# Patient Record
Sex: Female | Born: 1960
Health system: Southern US, Community
[De-identification: ages and names within clinical notes are randomized; demographics above are authoritative.]

## PROBLEM LIST (undated history)

## (undated) DIAGNOSIS — F419 Anxiety disorder, unspecified: Secondary | ICD-10-CM

## (undated) DIAGNOSIS — L570 Actinic keratosis: Secondary | ICD-10-CM

## (undated) DIAGNOSIS — F32A Depression, unspecified: Secondary | ICD-10-CM

## (undated) DIAGNOSIS — F329 Major depressive disorder, single episode, unspecified: Secondary | ICD-10-CM

## (undated) HISTORY — DX: Depression, unspecified: F32.A

## (undated) HISTORY — DX: Anxiety disorder, unspecified: F41.9

## (undated) HISTORY — DX: Actinic keratosis: L57.0

## (undated) HISTORY — DX: Major depressive disorder, single episode, unspecified: F32.9

---
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
## 2006-06-13 HISTORY — PX: BREAST BIOPSY: SHX20

## 2017-12-27 ENCOUNTER — Ambulatory Visit: Payer: Commercial Managed Care - HMO | Attending: Physician Assistant | Admitting: Occupational Therapy

## 2017-12-27 ENCOUNTER — Other Ambulatory Visit: Payer: Self-pay

## 2017-12-27 ENCOUNTER — Encounter: Payer: Self-pay | Admitting: Occupational Therapy

## 2017-12-27 DIAGNOSIS — M6281 Muscle weakness (generalized): Secondary | ICD-10-CM

## 2017-12-27 DIAGNOSIS — M25532 Pain in left wrist: Secondary | ICD-10-CM | POA: Insufficient documentation

## 2017-12-27 DIAGNOSIS — M25632 Stiffness of left wrist, not elsewhere classified: Secondary | ICD-10-CM | POA: Diagnosis not present

## 2017-12-27 DIAGNOSIS — M79642 Pain in left hand: Secondary | ICD-10-CM | POA: Insufficient documentation

## 2017-12-27 NOTE — Patient Instructions (Signed)
Heat ,  PROM for wrist RD, UD, flexion and extention   10 reps   2 -3 x day   AROM for wrist in all planes   10 reps  2 -3 x day  Tendon glides  And opposition   keep pain under 1-2/10 and slight pull or stretch

## 2017-12-27 NOTE — Therapy (Signed)
[place] [facility] [address] [place], [address] Phone: [phone]   Fax:  [phone]  Occupational Therapy Evaluation  Patient Details  Name: Barbara Russo MRN: [id number] Date of Birth: [birth year] Referring Provider: Dorise Hiss   Encounter Date: 12/27/2017  OT End of Session - 12/27/17 1712    Visit Number  1    Number of Visits  12    Date for OT Re-Evaluation  02/07/18    OT Start Time  1440    OT Stop Time  1522    OT Time Calculation (min)  42 min    Activity Tolerance  Patient tolerated treatment well    Behavior During Therapy  [facility] for tasks assessed/performed       History reviewed. No pertinent past medical history.  History reviewed. No pertinent surgical history.  There were no vitals filed for this visit.  Subjective Assessment - 12/27/17 1705    Subjective   I just moved down here from [place] - I fell  about 8th of May - while I was moving  - fell over a  bag - was in brace for 6 wks and then since end of June out of splint but still having pain and stiffness in my R wrist -and I am R hand dominant     Patient Stated Goals  I want to use my hand without pain - being able to pick up things, write , use scissors, opening jars, cutting food, typing ,     Currently in Pain?  No/denies        [facility] OT Assessment - 12/27/17 0001      Assessment   Medical Diagnosis  R distal radius fx    Referring Provider  Dorise Hiss    Onset Date/Surgical Date  10/18/17    Hand Dominance  Right      Home  Environment   Lives With  Alone      Prior Function   Leisure  Doing real estate trng, have cat , likes to cook, on computer a lot, study      AROM   Right Wrist Extension  57 Degrees    Right Wrist Flexion  35 Degrees    Right Wrist Radial Deviation  13 Degrees    Right Wrist Ulnar Deviation  20 Degrees    Left Wrist Extension  75 Degrees    Left Wrist Flexion  90 Degrees    Left Wrist  Radial Deviation  24 Degrees    Left Wrist Ulnar Deviation  42 Degrees      Strength   Right Hand Grip (lbs)  24    Right Hand Lateral Pinch  10 lbs    Right Hand 3 Point Pinch  12 lbs    Left Hand Grip (lbs)  50    Left Hand Lateral Pinch  16 lbs    Left Hand 3 Point Pinch  18 lbs       Fluidotherapy done with AROM for R wrist in all planes   review HEP and hand out provided      Heat ,  PROM for wrist RD, UD, flexion and extention   10 reps   2 -3 x day   AROM for wrist in all planes   10 reps  2 -3 x day  Tendon glides  And opposition   keep pain under 1-2/10 and slight pull or stretch  OT Education - 12/27/17 1712    Education Details  findings of eval and HEP     Person(s) Educated  Patient    Methods  Explanation;Demonstration;Tactile cues;Verbal cues    Comprehension  Verbalized understanding;Returned demonstration;Verbal cues required       OT Short Term Goals - 12/27/17 1717      OT SHORT TERM GOAL #1   Title  Pain on PRWHE improve with more than 20 points     Baseline  pain on PRWHE is 21/50    Time  3    Period  Weeks    Status  New    Target Date  01/17/18      OT SHORT TERM GOAL #2   Title  R wrist AROM improve to [facility] to use wrist in more than 50% of ADL's and IADl's without increase pain or symptoms     Baseline  see flowsheet- decrease AROM wrist RD,UD, flexion and extention     Time  3    Period  Weeks    Status  New    Target Date  01/17/18        OT Long Term Goals - 12/27/17 1718      OT LONG TERM GOAL #1   Title  R wrist strength increase  to 4+/5 in ROM to be able to  turn doorknob, do housework  without symptoms     Baseline  See eval     Time  5    Period  Weeks    Status  New    Target Date  01/31/18      OT LONG TERM GOAL #2   Title  R grip strength increase  to more 50% to carry more than 5 lbs use scissors without increase pain     Baseline  Grip on R 24, L 50 lbs    Time  6    Period  Weeks     Status  New    Target Date  02/07/18      OT LONG TERM GOAL #3   Title  Function score on PRWHE improve wiht more than 20 points     Baseline  function score on PRWHE at eval 33/50    Time  6    Period  Weeks    Status  New    Target Date  02/07/18            Plan - 12/27/17 1713    Clinical Impression Statement  Pt present at OT eval about 10 wks out from R distal radius fx - pt was in cast 6 wks - and then moved down to [place] - pt show increase pain to 8/10 per pt at times , decrease AROM in wrist and some pain with making fist or gripping objects - limiting her functional use of R hand in ADL's and IADl's     Occupational performance deficits (Please refer to evaluation for details):  ADL's;IADL's;Play;Leisure;Education    Rehab Potential  Good    OT Frequency  2x / week    OT Duration  6 weeks    OT Treatment/Interventions  Self-care/ADL training;Therapeutic exercise;Patient/family education;Paraffin;Fluidtherapy;Passive range of motion;Manual Therapy;Contrast Bath    Plan  Assess progres with HEP and add strengthening     Clinical Decision Making  Several treatment options, min-mod task modification necessary    OT Home Exercise Plan  see pt instruction    Consulted and Agree with Plan of Care  Patient  Patient will benefit from skilled therapeutic intervention in order to improve the following deficits and impairments:  Impaired flexibility, Pain, Decreased strength, Impaired UE functional use, Decreased range of motion  Visit Diagnosis: Pain in left hand  Pain in left wrist  Stiffness of left wrist, not elsewhere classified  Muscle weakness (generalized)    Problem List There are no active problems to display for this patient.   Rosalyn Gess OTR/L,CLT 12/27/2017, 5:25 PM  [place] [facility] [address] [place], [address] Phone: [phone]   Fax:  [phone]  Name: Barbara Russo MRN: [id number] Date of Birth: 07-16-1960

## 2018-01-01 ENCOUNTER — Ambulatory Visit: Payer: Commercial Managed Care - HMO | Admitting: Occupational Therapy

## 2018-01-01 DIAGNOSIS — M79642 Pain in left hand: Secondary | ICD-10-CM | POA: Diagnosis not present

## 2018-01-01 DIAGNOSIS — M6281 Muscle weakness (generalized): Secondary | ICD-10-CM

## 2018-01-01 DIAGNOSIS — M25632 Stiffness of left wrist, not elsewhere classified: Secondary | ICD-10-CM

## 2018-01-01 DIAGNOSIS — M25532 Pain in left wrist: Secondary | ICD-10-CM

## 2018-01-01 NOTE — Therapy (Signed)
[place] [facility] [address], [place], [address] Phone: [phone]   Fax:  [phone]  Occupational Therapy Treatment  Patient Details  Name: Barbara Russo MRN: [id number] Date of Birth: Jun 15, 1960 Referring Provider: Dorise Hiss   Encounter Date: 01/01/2018  OT End of Session - 01/01/18 1443    Visit Number  2    Number of Visits  12    Date for OT Re-Evaluation  02/07/18    OT Start Time  1430    OT Stop Time  1512    OT Time Calculation (min)  42 min    Activity Tolerance  Patient tolerated treatment well    Behavior During Therapy  [facility] for tasks assessed/performed       No past medical history on file.  No past surgical history on file.  There were no vitals filed for this visit.  Subjective Assessment - 01/01/18 1441    Subjective   I am doing okay - feel like my wrist can move little better - not as tight - did had some soreness the next day after my exercises -     Patient Stated Goals  I want to use my hand without pain - being able to pick up things, write , use scissors, opening jars, cutting food, typing ,     Currently in Pain?  No/denies         [facility] OT Assessment - 01/01/18 0001      AROM   Right Wrist Extension  65 Degrees    Right Wrist Flexion  55 Degrees    Right Wrist Radial Deviation  20 Degrees    Right Wrist Ulnar Deviation  23 Degrees            assess AROM for L wrist in all planes -and compare in flowsheet    OT Treatments/Exercises (OP) - 01/01/18 0001      RUE Fluidotherapy   Number Minutes Fluidotherapy  8 Minutes    RUE Fluidotherapy Location  Hand;Wrist    Comments  AROM at [facility] to increase ROM      soft tissue mobs done to digits MC spreads  And Graston tool nr 2 for sweeping on dorsal and volar wrist  And forearm     PROM for wrist RD, UD, flexion and extention over edge of table  And review too - prayer stretch and wrist flexion over arm  rest    10 reps   2 x day    Same HEP  To cont with for PROM  But add and review    16 oz hammer for wrist in all planes - 10 reps  1 set   2 x day  Can increase to 2 sets in 3-4 days  Putty teal for gripping and lat , 3 point grip  12 reps   2 x day  Increase to 2 sets  3-4 days        OT Education - 01/01/18 1443    Education Details  upgrade HEP     Person(s) Educated  Patient    Methods  Explanation;Demonstration;Handout    Comprehension  Returned demonstration;Verbalized understanding       OT Short Term Goals - 12/27/17 1717      OT SHORT TERM GOAL #1   Title  Pain on PRWHE improve with more than 20 points     Baseline  pain on PRWHE is 21/50    Time  3    Period  Weeks    Status  New    Target Date  01/17/18      OT SHORT TERM GOAL #2   Title  R wrist AROM improve to [facility] to use wrist in more than 50% of ADL's and IADl's without increase pain or symptoms     Baseline  see flowsheet- decrease AROM wrist RD,UD, flexion and extention     Time  3    Period  Weeks    Status  New    Target Date  01/17/18        OT Long Term Goals - 12/27/17 1718      OT LONG TERM GOAL #1   Title  R wrist strength increase  to 4+/5 in ROM to be able to  turn doorknob, do housework  without symptoms     Baseline  See eval     Time  5    Period  Weeks    Status  New    Target Date  01/31/18      OT LONG TERM GOAL #2   Title  R grip strength increase  to more 50% to carry more than 5 lbs use scissors without increase pain     Baseline  Grip on R 24, L 50 lbs    Time  6    Period  Weeks    Status  New    Target Date  02/07/18      OT LONG TERM GOAL #3   Title  Function score on PRWHE improve wiht more than 20 points     Baseline  function score on PRWHE at eval 33/50    Time  6    Period  Weeks    Status  New    Target Date  02/07/18            Plan - 01/01/18 1443    Clinical Impression Statement  Pt made great progress in AROM in R wrist AROM since eval  last week - initated this date weight for wrist and putty for grip - pt to gradually increase sets over the next week -and will reassess next week - but keep pain under 2/10 pain     Occupational performance deficits (Please refer to evaluation for details):  ADL's;IADL's;Play;Leisure;Education    Rehab Potential  Good    OT Frequency  1x / week    OT Duration  6 weeks    OT Treatment/Interventions  Self-care/ADL training;Therapeutic exercise;Patient/family education;Paraffin;Fluidtherapy;Passive range of motion;Manual Therapy;Contrast Bath    Plan  upgrade HEP as needed     Clinical Decision Making  Several treatment options, min-mod task modification necessary    OT Home Exercise Plan  see pt instruction    Consulted and Agree with Plan of Care  Patient       Patient will benefit from skilled therapeutic intervention in order to improve the following deficits and impairments:  Impaired flexibility, Pain, Decreased strength, Impaired UE functional use, Decreased range of motion  Visit Diagnosis: Pain in left hand  Pain in left wrist  Stiffness of left wrist, not elsewhere classified  Muscle weakness (generalized)    Problem List There are no active problems to display for this patient.   Rosalyn Gess OTR/L,CLT 01/01/2018, 3:56 PM  [place] [facility] [address] [place], [address] Phone: [phone]   Fax:  [phone]  Name: Tomorrow Dehaas MRN: [id number] Date of  Birth: 11-06-1960

## 2018-01-01 NOTE — Patient Instructions (Signed)
Same HEP   add 16 oz hammer for wrist in all planes - 10 reps  1 set   2 x day  Can increase to 2 sets in 3-4 days  Putty teal for gripping and lat , 3 point grip  12 reps   2 x day  Increase to 2 sets  3-4 days

## 2018-01-03 ENCOUNTER — Ambulatory Visit: Payer: Commercial Managed Care - HMO | Admitting: Occupational Therapy

## 2018-01-08 ENCOUNTER — Ambulatory Visit: Payer: Commercial Managed Care - HMO | Admitting: Occupational Therapy

## 2018-01-10 ENCOUNTER — Ambulatory Visit: Payer: Commercial Managed Care - HMO | Admitting: Occupational Therapy

## 2018-01-10 DIAGNOSIS — M6281 Muscle weakness (generalized): Secondary | ICD-10-CM

## 2018-01-10 DIAGNOSIS — M25632 Stiffness of left wrist, not elsewhere classified: Secondary | ICD-10-CM

## 2018-01-10 DIAGNOSIS — M25532 Pain in left wrist: Secondary | ICD-10-CM

## 2018-01-10 DIAGNOSIS — M79642 Pain in left hand: Secondary | ICD-10-CM | POA: Diagnosis not present

## 2018-01-10 NOTE — Patient Instructions (Signed)
Cont with heat , PROM for wrist flexion and prayer stretch   2 lbs for wrist in all planes  10 reps  2 x day and increase to 3 sets again over the next week   Same putty - same HEP  But add pulling and twisting ( boht ways )  10 reps  2 x day  Increase sets over  Week

## 2018-01-10 NOTE — Therapy (Signed)
[place] [facility] [address] [place], [address] Phone: [phone]   Fax:  [phone]  Occupational Therapy Treatment  Patient Details  Name: Barbara Russo MRN: [id number] Date of Birth: 1960/12/28 Referring Provider: Dorise Hiss   Encounter Date: 01/10/2018  OT End of Session - 01/10/18 1337    Visit Number  3    Number of Visits  12    Date for OT Re-Evaluation  02/07/18    OT Start Time  1207    OT Stop Time  1240    OT Time Calculation (min)  33 min    Activity Tolerance  Patient tolerated treatment well    Behavior During Therapy  [facility] for tasks assessed/performed       No past medical history on file.  No past surgical history on file.  There were no vitals filed for this visit.  Subjective Assessment - 01/10/18 1206    Subjective   Doing more things with my hands - pain is better- little more soreness but not bad- need to go over the hammer  exercise -and  the putty feels so good - maybe over did it -     Patient Stated Goals  I want to use my hand without pain - being able to pick up things, write , use scissors, opening jars, cutting food, typing ,     Currently in Pain?  Yes    Pain Score  1     Pain Location  Wrist    Pain Orientation  Right    Pain Descriptors / Indicators  Sore    Pain Type  Acute pain    Pain Onset  More than a month ago    Pain Frequency  Intermittent         [facility] OT Assessment - 01/10/18 0001      AROM   Right Wrist Extension  75 Degrees    Right Wrist Flexion  73 Degrees    Right Wrist Radial Deviation  10 Degrees    Right Wrist Ulnar Deviation  28 Degrees      Strength   Right Hand Grip (lbs)  40    Right Hand Lateral Pinch  16 lbs    Right Hand 3 Point Pinch  16 lbs    Left Hand Grip (lbs)  50    Left Hand Lateral Pinch  16 lbs    Left Hand 3 Point Pinch  18 lbs       assess progress in AROM in wrist and grip /prehension - great progress -  see flowsheet  Pt report not able to hyper ext - 4th and 5th on R hand - appear pt has some dupuytrents -          OT Treatments/Exercises (OP) - 01/10/18 0001      RUE Fluidotherapy   Number Minutes Fluidotherapy  8 Minutes    RUE Fluidotherapy Location  Hand;Wrist    Comments  AROM for wrist flexoin      Soft tissue mobs using Graston tool nr 2 over volar and dorsal forearm and wrist prior to flexion stretch  PROM for wrist flexion - open hand can close fist  and prayer stretch   2 lbs for wrist in all planes  10 reps  - no pain  2 x day and increase to 3 sets again over the next week   Same putty - same HEP  But done and  add  pulling and twisting ( boht ways )  10 reps  2 x day  Increase sets over  Week        OT Education - 01/10/18 1337    Education Details  upgrade HEP to 2 lbs weight and more putty HEP     Person(s) Educated  Patient    Methods  Explanation;Demonstration;Handout    Comprehension  Verbalized understanding;Returned demonstration       OT Short Term Goals - 12/27/17 1717      OT SHORT TERM GOAL #1   Title  Pain on PRWHE improve with more than 20 points     Baseline  pain on PRWHE is 21/50    Time  3    Period  Weeks    Status  New    Target Date  01/17/18      OT SHORT TERM GOAL #2   Title  R wrist AROM improve to [facility] to use wrist in more than 50% of ADL's and IADl's without increase pain or symptoms     Baseline  see flowsheet- decrease AROM wrist RD,UD, flexion and extention     Time  3    Period  Weeks    Status  New    Target Date  01/17/18        OT Long Term Goals - 12/27/17 1718      OT LONG TERM GOAL #1   Title  R wrist strength increase  to 4+/5 in ROM to be able to  turn doorknob, do housework  without symptoms     Baseline  See eval     Time  5    Period  Weeks    Status  New    Target Date  01/31/18      OT LONG TERM GOAL #2   Title  R grip strength increase  to more 50% to carry more than 5 lbs use scissors  without increase pain     Baseline  Grip on R 24, L 50 lbs    Time  6    Period  Weeks    Status  New    Target Date  02/07/18      OT LONG TERM GOAL #3   Title  Function score on PRWHE improve wiht more than 20 points     Baseline  function score on PRWHE at eval 33/50    Time  6    Period  Weeks    Status  New    Target Date  02/07/18            Plan - 01/10/18 1338    Clinical Impression Statement  Pt made again great progress in AROM in wrist in all planes and grip /prehension increase  - only decrease still in flexion of wrist - with some soreness but not bad - increase functional use  - upgrade HEP again to 2 lbs for wrist     Occupational performance deficits (Please refer to evaluation for details):  ADL's;IADL's;Play;Leisure;Education    Rehab Potential  Good    OT Frequency  1x / week    OT Duration  4 weeks    OT Treatment/Interventions  Self-care/ADL training;Therapeutic exercise;Patient/family education;Paraffin;Fluidtherapy;Passive range of motion;Manual Therapy;Contrast Bath    Plan  upgrade HEP as needed     Clinical Decision Making  Several treatment options, min-mod task modification necessary    OT Home Exercise Plan  see pt instruction    Consulted and Agree with  Plan of Care  Patient       Patient will benefit from skilled therapeutic intervention in order to improve the following deficits and impairments:  Impaired flexibility, Pain, Decreased strength, Impaired UE functional use, Decreased range of motion  Visit Diagnosis: Pain in left hand  Pain in left wrist  Stiffness of left wrist, not elsewhere classified  Muscle weakness (generalized)    Problem List There are no active problems to display for this patient.   Rosalyn Gess OTR/l,CLT 01/10/2018, 1:39 PM  [place] [facility] [address], [place], [address] Phone: [phone]   Fax:  [phone]  Name: Barbara Russo MRN: [id number] Date of Birth: Jan 20, 1961

## 2018-01-19 ENCOUNTER — Ambulatory Visit: Payer: 59 | Admitting: Occupational Therapy

## 2018-01-25 ENCOUNTER — Ambulatory Visit: Payer: 59 | Admitting: Occupational Therapy

## 2018-01-30 ENCOUNTER — Ambulatory Visit (INDEPENDENT_AMBULATORY_CARE_PROVIDER_SITE_OTHER): Payer: 59 | Admitting: Psychiatry

## 2018-01-30 ENCOUNTER — Encounter: Payer: Self-pay | Admitting: Psychiatry

## 2018-01-30 VITALS — BP 130/80 | HR 120 | Temp 97.7°F | Ht 68.7 in | Wt 214.2 lb

## 2018-01-30 DIAGNOSIS — F331 Major depressive disorder, recurrent, moderate: Secondary | ICD-10-CM

## 2018-01-30 DIAGNOSIS — F5081 Binge eating disorder: Secondary | ICD-10-CM | POA: Diagnosis not present

## 2018-01-30 MED ORDER — CARIPRAZINE HCL 3 MG PO CAPS: 3.0000 mg | ORAL_CAPSULE | Freq: Every day | ORAL | 1 refills | Status: DC

## 2018-01-30 MED ORDER — BUSPIRONE HCL 15 MG PO TABS: 15.0000 mg | ORAL_TABLET | Freq: Two times a day (BID) | ORAL | 1 refills | Status: DC

## 2018-01-30 MED ORDER — LISDEXAMFETAMINE DIMESYLATE 50 MG PO CAPS: 50.0000 mg | ORAL_CAPSULE | Freq: Every day | ORAL | 0 refills | Status: DC

## 2018-01-30 MED ORDER — BUPROPION HCL ER (XL) 300 MG PO TB24: 300.0000 mg | ORAL_TABLET | Freq: Every day | ORAL | 1 refills | Status: DC

## 2018-01-30 NOTE — Progress Notes (Signed)
Psychiatric Initial Adult Assessment   Patient Identification: Barbara Russo MRN:  [id number] Date of Evaluation:  01/30/2018 Referral Source: Dr.Hala Moustafa Chief Complaint:  ' I am here to establish care," Chief Complaint    Establish Care; Anxiety; Depression     Visit Diagnosis:    ICD-10-CM   1. MDD (major depressive disorder), recurrent episode, moderate (HCC) F33.1 buPROPion (WELLBUTRIN XL) 300 MG 24 hr tablet    cariprazine (VRAYLAR) capsule    busPIRone (BUSPAR) 15 MG tablet    DISCONTINUED: busPIRone (BUSPAR) 15 MG tablet  2. Binge eating disorder F50.81 lisdexamfetamine (VYVANSE) 50 MG capsule    History of Present Illness:  Barbara Russo is a 57 yr old CF, single, unemployed , lives in [place], history of MDD, binge eating disorder, headaches, hyperlipidemia, presented to the clinic today to establish care.  Patient recently moved to [place] in May 2019 from [place].  Patient used to be under the care of [facility] in [place].  Patient reports she moved to [place] to be closer to her brother and start a job with his company.  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  She reports she has a history of depression and binge eating disorder.  She reports she has been struggling with depression on and off since the past 10 years or more.  She reports her depressive symptoms worsened 4-5 years ago since she went through several psychosocial stressors at this time.  She reports 5 years ago she had to leave her job to take care of her elderly parents who were dying.  She reports at the same time she broke up with her ex-boyfriend.  Patient reports she was started on additional medication during that time.  She reports the medications helped her a lot.  Patient describes her depressive symptoms as crying spells, sadness, excessive eating some days and not eating the other days, mood lability, low energy and so on.   She reports she takes Wellbutrin 300 mg, BuSpar 30 mg daily as well as Vraylar.  Reports this combination of medication is helping.  She reports she wants to stay on her medications as prescribed for now.  Patient also reports a history of binge eating disorder.  Patient reports there are days when she eats okay and other days when she eats a lot/excessive.  She reports she has gained a lot of weight also over the years due to her binging.  She reports she copes with anxiety stress by binging.  She denies any compensatory behaviors like using laxatives or vomiting and so on.  She reports she is on Vyvanse prescribed by her psychiatrist since the past 1 year or so.  Patient reports that is helpful.  She denies any side effects.  Patient denies any suicidality.  Patient denies any perceptual disturbances.  Patient denies any homicidality.  Patient denies any manic or hypomanic symptoms.  Patient denies any substance abuse problems.  Patient reports good social support system from her brothers.  One of her brothers came with her to [place] and currently lives close to her.  She reports she is in training to start a job at her brother's company.  Associated Signs/Symptoms: Depression Symptoms:  depressed mood, weight gain, increased appetite, (Hypo) Manic Symptoms:  denies Anxiety Symptoms:  denies Psychotic Symptoms:  denies PTSD Symptoms: Negative  Past Psychiatric History: Patient denies any inpatient mental health admissions.  Patient used to be under the care of Dr.Moustafa  at [facility] in [place].  Patient denies any suicide attempts  Previous Psychotropic Medications: Yes Trials of medications like Wellbutrin, Vyvanse, BuSpar, Valium, Depakote ( FOR HEADACHES)   Substance Abuse History in the last 12 months:  No.  Consequences of Substance Abuse: Negative  Past Medical History:  Past Medical History:  Diagnosis Date  . Anxiety   . Depression    History reviewed.  No pertinent surgical history.  Family Psychiatric History: Patient reports she was adopted and hence does not know if any of her family members had mental health problems.  She denies any mental health issues and her adopted parents/family  Family History:  Family History  Adopted: Yes    Social History:   Social History   Socioeconomic History  . Marital status: Single    Spouse name: Not on file  . Number of children: 0  . Years of education: Not on file  . Highest education level: Bachelor's degree (e.g., BA, AB, BS)  Occupational History  . Not on file  Social Needs  . Financial resource strain: Not hard at all  . Food insecurity:    Worry: Never true    Inability: Never true  . Transportation needs:    Medical: No    Non-medical: No  Tobacco Use  . Smoking status: Never Smoker  . Smokeless tobacco: Never Used  Substance and Sexual Activity  . Alcohol use: Yes    Alcohol/week: 2.0 standard drinks    Types: 2 Cans of beer per week  . Drug use: Never  . Sexual activity: Not Currently  Lifestyle  . Physical activity:    Days per week: 0 days    Minutes per session: 0 min  . Stress: Not at all  Relationships  . Social connections:    Talks on phone: More than three times a week    Gets together: Never    Attends religious service: Never    Active member of club or organization: No    Attends meetings of clubs or organizations: Never    Relationship status: Never married  Other Topics Concern  . Not on file  Social History Narrative  . Not on file    Additional Social History: Patient is single.  She currently lives in [place].  She moved here in May 2019 from [place].  Currently is unemployed however is in training to start a new job with her brother's company.  Patient has good social support system from her siblings.  Patient was adopted when she were 62 months old by her parents who passed away 3-4 years ago.  Patient denies having children.  Allergies:  No  Known Allergies  Metabolic Disorder Labs: No results found for: HGBA1C, MPG No results found for: PROLACTIN No results found for: CHOL, TRIG, HDL, CHOLHDL, VLDL, LDLCALC   Current Medications: Current Outpatient Medications  Medication Sig Dispense Refill  . buPROPion (WELLBUTRIN XL) 300 MG 24 hr tablet Take 1 tablet (300 mg total) by mouth daily. 90 tablet 1  . cariprazine (VRAYLAR) capsule Take 1 capsule (3 mg total) by mouth daily. 90 capsule 1  . gabapentin (NEURONTIN) 300 MG capsule Take 300 mg by mouth 4 (four) times daily.    Derrill Memo ON 02/09/2018] lisdexamfetamine (VYVANSE) 50 MG capsule Take 1 capsule (50 mg total) by mouth daily. 30 capsule 0  . simvastatin (ZOCOR) 20 MG tablet Take 20 mg by mouth daily.    Marland Kitchen topiramate (TOPAMAX) 25 MG capsule Take 25 mg  by mouth 2 (two) times daily.    . busPIRone (BUSPAR) 15 MG tablet Take 1 tablet (15 mg total) by mouth 2 (two) times daily. 180 tablet 1  . [START ON 03/11/2018] lisdexamfetamine (VYVANSE) 50 MG capsule Take 1 capsule (50 mg total) by mouth daily. 30 capsule 0   No current facility-administered medications for this visit.     Neurologic: Headache: Yes Seizure: No Paresthesias:No  Musculoskeletal: Strength & Muscle Tone: within normal limits Gait & Station: normal Patient leans: N/A  Psychiatric Specialty Exam: Review of Systems  Psychiatric/Behavioral: Positive for depression (stable on medications). The patient is nervous/anxious (situational).   All other systems reviewed and are negative.   Blood pressure 130/80, pulse (!) 120, temperature 97.7 F (36.5 C), temperature source Oral, height 5' 8.7" (1.745 m), weight 214 lb 3.2 oz (97.2 kg).Body mass index is 31.91 kg/m.  General Appearance: Casual  Eye Contact:  Fair  Speech:  Clear and Coherent  Volume:  Normal  Mood:  Anxious situational  Affect:  Appropriate  Thought Process:  Goal Directed and Descriptions of Associations: Intact  Orientation:  Full  (Time, Place, and Person)  Thought Content:  Logical  Suicidal Thoughts:  No  Homicidal Thoughts:  No  Memory:  Immediate;   Fair Recent;   Fair Remote;   Fair  Judgement:  Fair  Insight:  Fair  Psychomotor Activity:  Normal  Concentration:  Concentration: Fair and Attention Span: Fair  Recall:  [employer] of Knowledge:Fair  Language: Fair  Akathisia:  No  Handed:  Right  AIMS (if indicated):  Denies stiffness, rigidity  Assets:  Communication Skills Desire for Improvement Social Support  ADL's:  Intact  Cognition: WNL  Sleep:  fair    Treatment Plan Summary:Barbara Russo is a 57 yr old patient female, single, unemployed, lives in [place], recently relocated here from [place] , has a history of depression and binge eating disorder, presented to the clinic today to establish care.  Patient reports she is currently compliant with her medications as prescribed and denies any mood symptoms or side effects.  We will continue medications as noted below.  Also discussed referral for psychotherapy.  She will establish care with Ms. Royal Piedra. Medication management and Plan as noted below  Plan MDD, recurrent, moderate PHQ 9 equals 10 Continue Wellbutrin XL 300 mg p.o. daily BuSpar 15 mg p.o. twice daily. Continue Vraylar 3 mg PO daily.  Binge eating disorder Continue Vyvanse 50 mg p.o. daily-provided her 2 scripts with date specified.  We will also start psychotherapy with Ms. Royal Piedra for her depression and binge eating disorder.  I have reviewed medical records from [place] from [facility], [place].  Discussed with patient to get the following labs-TSH, lipid panel, prolactin, hemoglobin A1c.  Follow-up in clinic in 8 weeks or sooner if needed.  More than 50 % of the time was spent for psychoeducation and supportive psychotherapy and care coordination.  This note was generated in part or whole with voice recognition software. Voice recognition is  usually quite accurate but there are transcription errors that can and very often do occur. I apologize for any typographical errors that were not detected and corrected.     Ursula Alert, MD 8/20/201911:54 AM

## 2018-02-15 DIAGNOSIS — Z0000 Encounter for general adult medical examination without abnormal findings: Secondary | ICD-10-CM | POA: Diagnosis not present

## 2018-02-15 DIAGNOSIS — E7800 Pure hypercholesterolemia, unspecified: Secondary | ICD-10-CM | POA: Insufficient documentation

## 2018-02-15 DIAGNOSIS — Z780 Asymptomatic menopausal state: Secondary | ICD-10-CM | POA: Insufficient documentation

## 2018-02-15 DIAGNOSIS — R635 Abnormal weight gain: Secondary | ICD-10-CM | POA: Insufficient documentation

## 2018-02-15 DIAGNOSIS — Z23 Encounter for immunization: Secondary | ICD-10-CM | POA: Diagnosis not present

## 2018-02-20 DIAGNOSIS — G43719 Chronic migraine without aura, intractable, without status migrainosus: Secondary | ICD-10-CM | POA: Diagnosis not present

## 2018-02-22 ENCOUNTER — Ambulatory Visit (INDEPENDENT_AMBULATORY_CARE_PROVIDER_SITE_OTHER): Payer: 59 | Admitting: Licensed Clinical Social Worker

## 2018-02-22 DIAGNOSIS — F331 Major depressive disorder, recurrent, moderate: Secondary | ICD-10-CM

## 2018-02-22 DIAGNOSIS — M8588 Other specified disorders of bone density and structure, other site: Secondary | ICD-10-CM | POA: Diagnosis not present

## 2018-02-22 NOTE — Progress Notes (Signed)
Comprehensive Clinical Assessment (CCA) Note  02/22/2018 Barbara Russo [id number]  Visit Diagnosis:      ICD-10-CM   1. MDD (major depressive disorder), recurrent episode, moderate ([place]) F33.1       CCA Part One  Part One has been completed on paper by the patient.  (See scanned document in Chart Review)  CCA Part Two A  Intake/Chief Complaint:  CCA Intake With Chief Complaint CCA Part Two Date: 02/22/18 CCA Part Two Time: 0902 Chief Complaint/Presenting Problem: Depression and anxiety. Patients Currently Reported Symptoms/Problems: I have had a few life events that got me depressed.  anxious to be around people. 3-4 years when the anxiety began.  had a hard time getting over parents death.   Individual's Strengths: dealing with people, overcoming obstacles (confront people),  Individual's Preferences: meet someone, lose weight Individual's Abilities: communicates Type of Services Patient Feels Are Needed: therapy  Mental Health Symptoms Depression:  Depression: Tearfulness, Weight gain/loss, Fatigue, Change in energy/activity, Increase/decrease in appetite  Mania:  Mania: N/A  Anxiety:   Anxiety: N/A  Psychosis:  Psychosis: N/A  Trauma:  Trauma: N/A  Obsessions:  Obsessions: N/A  Compulsions:  Compulsions: N/A  Inattention:  Inattention: N/A  Hyperactivity/Impulsivity:  Hyperactivity/Impulsivity: N/A  Oppositional/Defiant Behaviors:  Oppositional/Defiant Behaviors: N/A  Borderline Personality:  Emotional Irregularity: N/A  Other Mood/Personality Symptoms:      Mental Status Exam Appearance and self-care  Stature:  Stature: Average  Weight:  Weight: Average weight  Clothing:  Clothing: Neat/clean  Grooming:  Grooming: Normal  Cosmetic use:  Cosmetic Use: Age appropriate  Posture/gait:  Posture/Gait: Normal  Motor activity:  Motor Activity: Not Remarkable  Sensorium  Attention:  Attention: Normal  Concentration:  Concentration: Normal  Orientation:   Orientation: X5  Recall/memory:  Recall/Memory: Normal  Affect and Mood  Affect:  Affect: Appropriate  Mood:  Mood: Anxious  Relating  Eye contact:  Eye Contact: Normal  Facial expression:  Facial Expression: Responsive  Attitude toward examiner:  Attitude Toward Examiner: Cooperative  Thought and Language  Speech flow: Speech Flow: Normal  Thought content:  Thought Content: Appropriate to mood and circumstances  Preoccupation:     Hallucinations:     Organization:     Transport planner of Knowledge:  Fund of Knowledge: Average  Intelligence:  Intelligence: Average  Abstraction:  Abstraction: Normal  Judgement:  Judgement: Fair  Art therapist:  Reality Testing: Adequate  Insight:  Insight: Gaps  Decision Making:  Decision Making: Normal  Social Functioning  Social Maturity:  Social Maturity: Responsible  Social Judgement:  Social Judgement: Normal  Stress  Stressors:  Stressors: Grief/losses, Housing, Chiropodist, Work  Coping Ability:  Coping Ability: English as a second language teacher Deficits:     Supports:      Family and Psychosocial History: Family history Marital status: Single What is your sexual orientation?: heterosexual Does patient have children?: No  Childhood History:  Childhood History By whom was/is the patient raised?: Adoptive parents Additional childhood history information: Adopted at age 53 months.  Describes childhood as: normal, everything was fine, it had its moments.  I was very tall and felt out of place.  Reports that she was awkward.  Parents were strict Description of patient's relationship with caregiver when they were a child: Mother: she was wonderful.  Father: he was good to me Patient's description of current relationship with people who raised him/her: both deceased How were you disciplined when you got in trouble as a child/adolescent?: yell at me, grounded, loss of  privileges Does patient have siblings?: Yes Number of Siblings: 3(Barbara Russo 62, Barbara Russo, Barbara Russo 50) Description of patient's current relationship with siblings: has a good relationship with her younger and oldest brother.  Has minimal contact with Barbara Russo (holidays and birthday) Did patient suffer any verbal/emotional/physical/sexual abuse as a child?: No Did patient suffer from severe childhood neglect?: No Has patient ever been sexually abused/assaulted/raped as an adolescent or adult?: No Was the patient ever a victim of a crime or a disaster?: No Witnessed domestic violence?: No Has patient been effected by domestic violence as an adult?: No  CCA Part Two B  Employment/Work Situation: Employment / Work Situation Employment situation: Surveyor, mining Course) What is the longest time patient has a held a job?: 12 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 [employer] Did You Receive Any Psychiatric Treatment/Services While in the [employer]?: No Are There Guns or Other Weapons in [place]?: No Are These Psychologist, educational?: No  Education: Education Name of [place]: [place] Did Teacher, adult education From [employer]?: Yes Did Physicist, medical?: Yes What Type of College Degree Do you Have?: Bachelors Did [place]?: No What Was Your Major?: Business Management Did You Have An Individualized Education Program (IIEP): No Did You Have Any Difficulty At School?: No  Religion: Religion/Spirituality Are You A Religious Person?: No How Might This Affect Treatment?: denies  Leisure/Recreation: Leisure / Recreation Leisure and Hobbies: trying new restaurants, meeting new friends, boating, beach & lakes  Exercise/Diet: Exercise/Diet Do You Exercise?: No Have You Gained or Lost A Significant Amount of Weight in the Past Six Months?: Yes-Gained Number of Pounds Gained: 50(in the past 2 yrs) Do You Follow a Special Diet?: No Do You Have Any Trouble Sleeping?: No  CCA Part Two C  Alcohol/Drug  Use: Alcohol / Drug Use Pain Medications: denies Prescriptions: wellbutrin, buspar, caltrate 600D, cariprazine, zyrtec, coenzyme Q10, Depakot, Neurontin, Vyvase, zocor, topamax Over the Counter: Excedrin (as needed), multivitamin History of alcohol / drug use?: No history of alcohol / drug abuse                      CCA Part Three  ASAM's:  Six Dimensions of Multidimensional Assessment  Dimension 1:  Acute Intoxication and/or Withdrawal Potential:     Dimension 2:  Biomedical Conditions and Complications:     Dimension 3:  Emotional, Behavioral, or Cognitive Conditions and Complications:     Dimension 4:  Readiness to Change:     Dimension 5:  Relapse, Continued use, or Continued Problem Potential:     Dimension 6:  Recovery/Living Environment:      Substance use Disorder (SUD)    Social Function:  Social Functioning Social Maturity: Responsible Social Judgement: Normal  Stress:  Stress Stressors: Grief/losses, Housing, Chiropodist, Work Coping Ability: Overwhelmed Patient Takes Medications The Way The Doctor Instructed?: Yes Priority Risk: Low Acuity  Risk Assessment- Self-Harm Potential: Risk Assessment For Self-Harm Potential Thoughts of Self-Harm: No current thoughts Method: No plan Availability of Means: No access/NA  Risk Assessment -Dangerous to Others Potential: Risk Assessment For Dangerous to Others Potential Method: No Plan Availability of Means: No access or NA Intent: Vague intent or NA  DSM5 Diagnoses: There are no active problems to display for this patient.   Patient Centered Plan: Patient is on the following Treatment Plan(s):  Depression  Recommendations for Services/Supports/Treatments: Recommendations for Services/Supports/Treatments Recommendations For Services/Supports/Treatments: Individual Therapy, Medication Management  Treatment Plan Summary:    Referrals to Alternative Service(s): Referred to Alternative Service(s):   Place:    Date:   Time:    Referred to Alternative Service(s):   Place:   Date:   Time:    Referred to Alternative Service(s):   Place:   Date:   Time:    Referred to Alternative Service(s):   Place:   Date:   Time:     Lubertha South

## 2018-03-03 DIAGNOSIS — G43719 Chronic migraine without aura, intractable, without status migrainosus: Secondary | ICD-10-CM | POA: Diagnosis not present

## 2018-03-04 DIAGNOSIS — G43719 Chronic migraine without aura, intractable, without status migrainosus: Secondary | ICD-10-CM | POA: Insufficient documentation

## 2018-03-05 ENCOUNTER — Ambulatory Visit (INDEPENDENT_AMBULATORY_CARE_PROVIDER_SITE_OTHER): Payer: 59 | Admitting: Licensed Clinical Social Worker

## 2018-03-05 DIAGNOSIS — F331 Major depressive disorder, recurrent, moderate: Secondary | ICD-10-CM

## 2018-03-06 ENCOUNTER — Telehealth: Payer: Self-pay

## 2018-03-06 NOTE — Telephone Encounter (Signed)
pt called left message that she will need to have enough medication called in to get to next appt on  03-20-18

## 2018-03-06 NOTE — Telephone Encounter (Signed)
Per review of our records all her medications were sent in for 90 days with refill to pharmacy in august She also got Vyvanse script with date specified to last until late  October  Please ask her to verify with her pharmacy.

## 2018-03-07 DIAGNOSIS — G43719 Chronic migraine without aura, intractable, without status migrainosus: Secondary | ICD-10-CM | POA: Diagnosis not present

## 2018-03-07 NOTE — Telephone Encounter (Signed)
Pt.notified

## 2018-03-13 NOTE — Progress Notes (Signed)
   THERAPIST PROGRESS NOTE  Session Time: 63mn  Participation Level: Active  Behavioral Response: CasualAlertEuthymic  Type of Therapy: Individual Therapy  Treatment Goals addressed: Coping  Interventions: CBT and Motivational Interviewing  Summary: Barbara Beanis a 57y.o. female who presents with continued symptoms of diagnosis.  Therapist met with Patient in an initial therapy session to assess current mood and to build rapport. Therapist engaged Patient in discussion about her life and what is going well for her. Therapist provided support for Patient as she shared details about her life, her current stressors, mood, coping skills, her past and [place] [facility] [place] Therapist prompted Patient to discuss her support system and ways that she manages her daily stress, anger, and frustrations.   Patient was happy to discuss that she is now licensed to [place] homes [place]  LCSW discussed what psychotherapy is and is not and the importance of the therapeutic relationship to include open and honest communication between client and therapist and building trust.  Reviewed advantages and disadvantages of the therapeutic process and limitations to the therapeutic relationship including LCSW's role in maintaining the safety of the client, others and those in client's care.    Suicidal/Homicidal: No  Plan: Return again in2 weeks.  Diagnosis: Axis I: Depression    Axis II: No diagnosis    NLubertha South LCSW 03/05/2018

## 2018-03-19 ENCOUNTER — Ambulatory Visit: Payer: 59 | Admitting: Licensed Clinical Social Worker

## 2018-03-20 ENCOUNTER — Ambulatory Visit (INDEPENDENT_AMBULATORY_CARE_PROVIDER_SITE_OTHER): Payer: 59 | Admitting: Psychiatry

## 2018-03-20 ENCOUNTER — Other Ambulatory Visit: Payer: Self-pay

## 2018-03-20 ENCOUNTER — Encounter: Payer: Self-pay | Admitting: Psychiatry

## 2018-03-20 VITALS — BP 131/84 | HR 112 | Temp 97.7°F | Wt 212.6 lb

## 2018-03-20 DIAGNOSIS — F5081 Binge eating disorder: Secondary | ICD-10-CM | POA: Diagnosis not present

## 2018-03-20 DIAGNOSIS — F331 Major depressive disorder, recurrent, moderate: Secondary | ICD-10-CM

## 2018-03-20 MED ORDER — LISDEXAMFETAMINE DIMESYLATE 50 MG PO CAPS: 50.0000 mg | ORAL_CAPSULE | Freq: Every day | ORAL | 0 refills | Status: DC

## 2018-03-20 NOTE — Progress Notes (Signed)
[place] MD  OP Progress Note  03/20/2018 10:44 AM Barbara Russo  MRN:  [id number]  Chief Complaint: ' I am here to establish care."  Chief Complaint    Follow-up; Medication Refill     HPI: Day is a 57 year old Caucasian female, single, unemployed, lives in [place], history of MDD, binge eating disorder, headaches, hyperlipidemia, presented to the clinic today for a follow-up visit.  She describes her depressive symptoms as improving.  She denies any excessive sadness or crying spells and reports her energy is fair.  She continues to be compliant with her medications as prescribed, Wellbutrin, BuSpar and Vraylar.  She reports she would like to come off of her Arman Filter if possible since she does not think she needs to be on that class of medication.  Patient denies any side effects to the medications.  Patient reports she continues to work on her binging on food.  She reports she has been trying to lose some weight.  She actually has lost 2 pounds since her last visit here.  She continues to take Vyvanse as prescribed.  She denies any side effects.  Patient denies any suicidality.  Patient denies any perceptual disturbances.  Patient denies any homicidality.  Patient reports sleep is good.  Patient reports she is working on her Therapist, music and completed her  test recently.  She continues to spend time with her brothers family and reports them as very supportive.  She reports she has upcoming appointment with Elmyra Ricks for therapy session.     Visit Diagnosis:    ICD-10-CM   1. MDD (major depressive disorder), recurrent episode, moderate (HCC) F33.1    in early remission  2. Binge eating disorder F50.81 lisdexamfetamine (VYVANSE) 50 MG capsule    Past Psychiatric History: Reviewed past psychiatric history from my progress note on 01/30/2018.  Past trials of Valium, Depakote, BuSpar, Vyvanse, Wellbutrin  Past Medical History:  Past Medical History:  Diagnosis Date   . Anxiety   . Depression    History reviewed. No pertinent surgical history.  Family Psychiatric History: Reviewed family psychiatric history from my progress note on 01/30/2018.  Family History:  Family History  Adopted: Yes    Social History: Reviewed social history from my progress note on 01/30/2018 Social History   Socioeconomic History  . Marital status: Single    Spouse name: Not on file  . Number of children: 0  . Years of education: Not on file  . Highest education level: Bachelor's degree (e.g., BA, AB, BS)  Occupational History  . Not on file  Social Needs  . Financial resource strain: Not hard at all  . Food insecurity:    Worry: Never true    Inability: Never true  . Transportation needs:    Medical: No    Non-medical: No  Tobacco Use  . Smoking status: Never Smoker  . Smokeless tobacco: Never Used  Substance and Sexual Activity  . Alcohol use: Yes    Alcohol/week: 2.0 standard drinks    Types: 2 Cans of beer per week  . Drug use: Never  . Sexual activity: Not Currently  Lifestyle  . Physical activity:    Days per week: 0 days    Minutes per session: 0 min  . Stress: Not at all  Relationships  . Social connections:    Talks on phone: More than three times a week    Gets together: Never    Attends religious service: Never    Active member of  club or organization: No    Attends meetings of clubs or organizations: Never    Relationship status: Never married  Other Topics Concern  . Not on file  Social History Narrative  . Not on file    Allergies: No Known Allergies  Metabolic Disorder Labs: No results found for: HGBA1C, MPG No results found for: PROLACTIN No results found for: CHOL, TRIG, HDL, CHOLHDL, VLDL, LDLCALC No results found for: TSH  Therapeutic Level Labs: No results found for: LITHIUM No results found for: VALPROATE No components found for:  CBMZ  Current Medications: Current Outpatient Medications  Medication Sig Dispense  Refill  . buPROPion (WELLBUTRIN XL) 300 MG 24 hr tablet Take 1 tablet (300 mg total) by mouth daily. 90 tablet 1  . busPIRone (BUSPAR) 15 MG tablet Take 1 tablet (15 mg total) by mouth 2 (two) times daily. 180 tablet 1  . cariprazine (VRAYLAR) capsule Take 1 capsule (3 mg total) by mouth daily. 90 capsule 1  . gabapentin (NEURONTIN) 300 MG capsule Take 300 mg by mouth 4 (four) times daily.    Derrill Memo ON 04/08/2018] lisdexamfetamine (VYVANSE) 50 MG capsule Take 1 capsule (50 mg total) by mouth daily. 30 capsule 0  . [START ON 05/07/2018] lisdexamfetamine (VYVANSE) 50 MG capsule Take 1 capsule (50 mg total) by mouth daily. 30 capsule 0  . [START ON 06/04/2018] lisdexamfetamine (VYVANSE) 50 MG capsule Take 1 capsule (50 mg total) by mouth daily. 30 capsule 0  . simvastatin (ZOCOR) 20 MG tablet Take 20 mg by mouth daily.    Marland Kitchen topiramate (TOPAMAX) 25 MG capsule Take by mouth.    . Calcium Carbonate-Vitamin D (CALCIUM HIGH POTENCY/VITAMIN D) 600-200 MG-UNIT TABS Take 600 mg by mouth.    . cetirizine (ZYRTEC) 10 MG tablet Take by mouth.    . Coenzyme Q10 10 MG capsule Take by mouth.     No current facility-administered medications for this visit.      Musculoskeletal: Strength & Muscle Tone: within normal limits Gait & Station: normal Patient leans: N/A  Psychiatric Specialty Exam: Review of Systems  Psychiatric/Behavioral: Positive for depression (improving).  All other systems reviewed and are negative.   Blood pressure 131/84, pulse (!) 112, temperature 97.7 F (36.5 C), temperature source Oral, weight 212 lb 9.6 oz (96.4 kg).Body mass index is 31.67 kg/m.  General Appearance: Casual  Eye Contact:  Fair  Speech:  Clear and Coherent  Volume:  Normal  Mood:  Depressed improving  Affect:  Appropriate  Thought Process:  Goal Directed and Descriptions of Associations: Intact  Orientation:  Full (Time, Place, and Person)  Thought Content: Logical   Suicidal Thoughts:  No  Homicidal  Thoughts:  No  Memory:  Immediate;   Fair Recent;   Fair Remote;   Fair  Judgement:  Fair  Insight:  Fair  Psychomotor Activity:  Normal  Concentration:  Concentration: Fair and Attention Span: Fair  Recall:  [employer] of Knowledge: Fair  Language: Fair  Akathisia:  No  Handed:  Right  AIMS (if indicated): 0  Assets:  Communication Skills Desire for Improvement Social Support  ADL's:  Intact  Cognition: WNL  Sleep:  Fair   Screenings:   Assessment and Plan: Dianelly is a 57 year old Caucasian female, single, lives in [place], recently relocated here from [place], has a history of depression, binge eating disorder, presented to the clinic today for a follow-up visit.  He reports improvement in her mood symptoms as well as binge eating.  She continues to have good social support system.  She will continue medications as well as psychotherapy sessions.  Plan MDD Continue Wellbutrin XL 300 mg p.o. daily BuSpar 15 mg p.o. twice daily Continue Vraylar 3 mg p.o. daily Aims equals 0  Binge eating disorder Continue Vyvanse 50 mg p.o. daily-provided her 3 scripts with date specified  Patient will continue psychotherapy sessions with Ms. Peacock.  Discussed with patient that her Arman Filter can be tapered down if she continues to do well with regards to her mood symptoms.  Follow up in clinic in 3 months or sooner if needed.  More than 50 % of the time was spent for psychoeducation and supportive psychotherapy and care coordination.  This note was generated in part or whole with voice recognition software. Voice recognition is usually quite accurate but there are transcription errors that can and very often do occur. I apologize for any typographical errors that were not detected and corrected.       Ursula Alert, MD 03/20/2018, 10:44 AM

## 2018-04-03 ENCOUNTER — Ambulatory Visit (INDEPENDENT_AMBULATORY_CARE_PROVIDER_SITE_OTHER): Payer: 59 | Admitting: Licensed Clinical Social Worker

## 2018-04-03 DIAGNOSIS — F331 Major depressive disorder, recurrent, moderate: Secondary | ICD-10-CM | POA: Diagnosis not present

## 2018-04-09 NOTE — Progress Notes (Signed)
   THERAPIST PROGRESS NOTE  Session Time: 31mn  Participation Level: Active  Behavioral Response: CasualAlertEuthymic  Type of Therapy: Individual Therapy  Treatment Goals addressed: Coping  Interventions: CBT and Motivational Interviewing  Summary: Barbara Ferranteis a 57y.o. adult who presents with continued symptoms of diagnosis.  Therapist met with Patient in an outpatient setting to assess current mood and assist with making progress towards goals through the use of therapeutic intervention. Therapist did a brief mood check, assessing anger, fear, disgust, excitement, happiness, and sadness.  Provided support for Patient as she discussed her current mood. Stressed the importance of mood stabilization through learned coping skills and medication management.  Encouraged Patient to focus on her strengths and the positive aspects.    Suicidal/Homicidal: No  Plan: Return again in 2 weeks.  Diagnosis: Axis I: Depression    Axis II: No diagnosis    NLubertha South LCSW 04/03/2018

## 2018-05-16 DIAGNOSIS — G43719 Chronic migraine without aura, intractable, without status migrainosus: Secondary | ICD-10-CM | POA: Diagnosis not present

## 2018-05-31 ENCOUNTER — Ambulatory Visit: Payer: 59 | Admitting: Licensed Clinical Social Worker

## 2018-05-31 DIAGNOSIS — G43719 Chronic migraine without aura, intractable, without status migrainosus: Secondary | ICD-10-CM | POA: Diagnosis not present

## 2018-06-20 ENCOUNTER — Ambulatory Visit (INDEPENDENT_AMBULATORY_CARE_PROVIDER_SITE_OTHER): Payer: 59 | Admitting: Psychiatry

## 2018-06-20 ENCOUNTER — Other Ambulatory Visit: Payer: Self-pay

## 2018-06-20 ENCOUNTER — Encounter: Payer: Self-pay | Admitting: Psychiatry

## 2018-06-20 VITALS — BP 126/84 | HR 108 | Temp 97.8°F | Wt 214.6 lb

## 2018-06-20 DIAGNOSIS — Z634 Disappearance and death of family member: Secondary | ICD-10-CM

## 2018-06-20 DIAGNOSIS — F5081 Binge eating disorder: Secondary | ICD-10-CM | POA: Diagnosis not present

## 2018-06-20 DIAGNOSIS — F331 Major depressive disorder, recurrent, moderate: Secondary | ICD-10-CM | POA: Diagnosis not present

## 2018-06-20 MED ORDER — LISDEXAMFETAMINE DIMESYLATE 40 MG PO CAPS: 40.0000 mg | ORAL_CAPSULE | ORAL | 0 refills | Status: DC

## 2018-06-20 MED ORDER — BUSPIRONE HCL 15 MG PO TABS: 15.0000 mg | ORAL_TABLET | Freq: Two times a day (BID) | ORAL | 1 refills | Status: DC

## 2018-06-20 MED ORDER — BUPROPION HCL ER (XL) 300 MG PO TB24: 300.0000 mg | ORAL_TABLET | Freq: Every day | ORAL | 1 refills | Status: DC

## 2018-06-20 MED ORDER — CARIPRAZINE HCL 3 MG PO CAPS: 3.0000 mg | ORAL_CAPSULE | Freq: Every day | ORAL | 1 refills | Status: DC

## 2018-06-20 NOTE — Patient Instructions (Signed)
Healthy Eating Following a healthy eating pattern may help you to achieve and maintain a healthy body weight, reduce the risk of chronic disease, and live a long and productive life. It is important to follow a healthy eating pattern at an appropriate calorie level for your body. Your nutritional needs should be met primarily through food by choosing a variety of nutrient-rich foods. What are tips for following this plan? Reading food labels  Read labels and choose the following: ? Reduced or low sodium. ? Juices with 100% fruit juice. ? Foods with low saturated fats and high polyunsaturated and monounsaturated fats. ? Foods with whole grains, such as whole wheat, cracked wheat, brown rice, and wild rice. ? Whole grains that are fortified with folic acid. This is recommended for women who are pregnant or who want to become pregnant.  Read labels and avoid the following: ? Foods with a lot of added sugars. These include foods that contain brown sugar, corn sweetener, corn syrup, dextrose, fructose, glucose, high-fructose corn syrup, honey, invert sugar, lactose, malt syrup, maltose, molasses, raw sugar, sucrose, trehalose, or turbinado sugar.  Do not eat more than the following amounts of added sugar per day:  6 teaspoons (25 g) for women.  9 teaspoons (38 g) for men. ? Foods that contain processed or refined starches and grains. ? Refined grain products, such as white flour, degermed cornmeal, white bread, and white rice. Shopping  Choose nutrient-rich snacks, such as vegetables, whole fruits, and nuts. Avoid high-calorie and high-sugar snacks, such as potato chips, fruit snacks, and candy.  Use oil-based dressings and spreads on foods instead of solid fats such as butter, stick margarine, or cream cheese.  Limit pre-made sauces, mixes, and "instant" products such as flavored rice, instant noodles, and ready-made pasta.  Try more plant-protein sources, such as tofu, tempeh, black beans,  edamame, lentils, nuts, and seeds.  Explore eating plans such as the Mediterranean diet or vegetarian diet. Cooking  Use oil to saut or stir-fry foods instead of solid fats such as butter, stick margarine, or lard.  Try baking, boiling, grilling, or broiling instead of frying.  Remove the fatty part of meats before cooking.  Steam vegetables in water or broth. Meal planning   At meals, imagine dividing your plate into fourths: ? One-half of your plate is fruits and vegetables. ? One-fourth of your plate is whole grains. ? One-fourth of your plate is protein, especially lean meats, poultry, eggs, tofu, beans, or nuts.  Include low-fat dairy as part of your daily diet. Lifestyle  Choose healthy options in all settings, including home, work, school, restaurants, or stores.  Prepare your food safely: ? Wash your hands after handling raw meats. ? Keep food preparation surfaces clean by regularly washing with hot, soapy water. ? Keep raw meats separate from ready-to-eat foods, such as fruits and vegetables. ? Cook seafood, meat, poultry, and eggs to the recommended internal temperature. ? Store foods at safe temperatures. In general:  Keep cold foods at 59F (4.4C) or below.  Keep hot foods at 159F (60C) or above.  Keep your freezer at [facility] (-17.8C) or below.  Foods are no longer safe to eat when they have been between the temperatures of 40-159F (4.4-60C) for more than 2 hours. What foods should I eat? Fruits Aim to eat 2 cup-equivalents of fresh, canned (in natural juice), or frozen fruits each day. Examples of 1 cup-equivalent of fruit include 1 small apple, 8 large strawberries, 1 cup canned fruit,  cup  dried fruit, or 1 cup 100% juice. Vegetables Aim to eat 2-3 cup-equivalents of fresh and frozen vegetables each day, including different varieties and colors. Examples of 1 cup-equivalent of vegetables include 2 medium carrots, 2 cups raw, leafy greens, 1 cup chopped  vegetable (raw or cooked), or 1 medium baked potato. Grains Aim to eat 6 ounce-equivalents of whole grains each day. Examples of 1 ounce-equivalent of grains include 1 slice of bread, 1 cup ready-to-eat cereal, 3 cups popcorn, or  cup cooked rice, pasta, or cereal. Meats and other proteins Aim to eat 5-6 ounce-equivalents of protein each day. Examples of 1 ounce-equivalent of protein include 1 egg, 1/2 cup nuts or seeds, or 1 tablespoon (16 g) peanut butter. A cut of meat or fish that is the size of a deck of cards is about 3-4 ounce-equivalents.  Of the protein you eat each week, try to have at least 8 ounces come from seafood. This includes salmon, trout, herring, and anchovies. Dairy Aim to eat 3 cup-equivalents of fat-free or low-fat dairy each day. Examples of 1 cup-equivalent of dairy include 1 cup (240 mL) milk, 8 ounces (250 g) yogurt, 1 ounces (44 g) natural cheese, or 1 cup (240 mL) fortified soy milk. Fats and oils  Aim for about 5 teaspoons (21 g) per day. Choose monounsaturated fats, such as canola and olive oils, avocados, peanut butter, and most nuts, or polyunsaturated fats, such as sunflower, corn, and soybean oils, walnuts, pine nuts, sesame seeds, sunflower seeds, and flaxseed. Beverages  Aim for six 8-oz glasses of water per day. Limit coffee to three to five 8-oz cups per day.  Limit caffeinated beverages that have added calories, such as soda and energy drinks.  Limit alcohol intake to no more than 1 drink a day for nonpregnant women and 2 drinks a day for men. One drink equals 12 oz of beer (355 mL), 5 oz of wine (148 mL), or 1 oz of hard liquor (44 mL). Seasoning and other foods  Avoid adding excess amounts of salt to your foods. Try flavoring foods with herbs and spices instead of salt.  Avoid adding sugar to foods.  Try using oil-based dressings, sauces, and spreads instead of solid fats. This information is based on general [place] nutrition guidelines. For more  information, visit [employer]. Exact amounts may vary based on your nutrition needs. Summary  A healthy eating plan may help you to maintain a healthy weight, reduce the risk of chronic diseases, and stay active throughout your life.  Plan your meals. Make sure you eat the right portions of a variety of nutrient-rich foods.  Try baking, boiling, grilling, or broiling instead of frying.  Choose healthy options in all settings, including home, work, school, restaurants, or stores. This information is not intended to replace advice given to you by your health care provider. Make sure you discuss any questions you have with your health care provider. Document Released: 09/11/2017 Document Revised: 09/11/2017 Document Reviewed: 09/11/2017 Elsevier Interactive Patient Education  2019 [employer].

## 2018-06-20 NOTE — Progress Notes (Signed)
[facility] MD OP Progress Note  06/20/2018 10:28 AM Barbara Russo  MRN:  [id number]  Chief Complaint: ' I am here for follow up.' Chief Complaint    Follow-up     HPI: Barbara Russo is a 58 year old Caucasian female, single, unemployed, lives in [place], history of MDD, binge eating disorder, headaches, hyperlipidemia, presented to the clinic today for a follow-up visit.  Today reports that her cat died last night.  She reports the cat had cancer.  She had the cat for at least 15 years now.  She reports she was very close with this.  She became very tearful when she talked about her cat.  Some time was spent providing her supportive psychotherapy.  Discussed with patient to reach out to her therapist Ms. Peacock.  Patient currently reports her mood symptoms as worsening.  She reports it is more so because of the death of her cat.  She has been tearful on and off since yesterday.  She denies any suicidality.  She denies any perceptual disturbances.  She has been compliant with her medications.  She denies any side effects to the medications.  Patient reports some reduction in her appetite, which has been going on for a while, currently worsening.  She is on Vyvanse for binge eating disorder.  She denies any binging on food.  She reports she eats only 1 meal a day and that is only at supper.  Discussed with patient that her Vyvanse can be reduced to see if that will help her with her appetite.  Also discussed to make lifestyle changes and to make sure she eats at least 2-3 meals per day.  Printed out handouts and gave to patient to make changes in her diet.     Visit Diagnosis:    ICD-10-CM   1. MDD (major depressive disorder), recurrent episode, moderate (HCC) F33.1 buPROPion (WELLBUTRIN XL) 300 MG 24 hr tablet    busPIRone (BUSPAR) 15 MG tablet    cariprazine (VRAYLAR) capsule  2. Binge eating disorder F50.81 lisdexamfetamine (VYVANSE) 40 MG capsule  3. Bereavement Z63.4     Past Psychiatric  History: Reviewed past psychiatric history from my progress note on 01/30/2018.  Past trials of Valium, Depakote, BuSpar, Vyvanse, Wellbutrin  Past Medical History:  Past Medical History:  Diagnosis Date  . Anxiety   . Depression    History reviewed. No pertinent surgical history.  Family Psychiatric History: I have reviewed family psychiatric history from my progress note on 01/30/2018  Family History:  Family History  Adopted: Yes    Social History: Reviewed social history from my progress note on 01/30/2018. Social History   Socioeconomic History  . Marital status: Single    Spouse name: Not on file  . Number of children: 0  . Years of education: Not on file  . Highest education level: Bachelor's degree (e.g., BA, AB, BS)  Occupational History  . Not on file  Social Needs  . Financial resource strain: Not hard at all  . Food insecurity:    Worry: Never true    Inability: Never true  . Transportation needs:    Medical: No    Non-medical: No  Tobacco Use  . Smoking status: Never Smoker  . Smokeless tobacco: Never Used  Substance and Sexual Activity  . Alcohol use: Yes    Alcohol/week: 2.0 standard drinks    Types: 2 Cans of beer per week  . Drug use: Never  . Sexual activity: Not Currently  Lifestyle  . Physical  activity:    Days per week: 0 days    Minutes per session: 0 min  . Stress: Not at all  Relationships  . Social connections:    Talks on phone: More than three times a week    Gets together: Never    Attends religious service: Never    Active member of club or organization: No    Attends meetings of clubs or organizations: Never    Relationship status: Never married  Other Topics Concern  . Not on file  Social History Narrative  . Not on file    Allergies: No Known Allergies  Metabolic Disorder Labs: No results found for: HGBA1C, MPG No results found for: PROLACTIN No results found for: CHOL, TRIG, HDL, CHOLHDL, VLDL, LDLCALC No results found  for: TSH  Therapeutic Level Labs: No results found for: LITHIUM No results found for: VALPROATE No components found for:  CBMZ  Current Medications: Current Outpatient Medications  Medication Sig Dispense Refill  . buPROPion (WELLBUTRIN XL) 300 MG 24 hr tablet Take 1 tablet (300 mg total) by mouth daily. 90 tablet 1  . busPIRone (BUSPAR) 15 MG tablet Take 1 tablet (15 mg total) by mouth 2 (two) times daily. 180 tablet 1  . Butalbital-APAP-Caffeine 50-325-40 MG capsule Take by mouth.    . Calcium Carbonate-Vitamin D (CALCIUM HIGH POTENCY/VITAMIN D) 600-200 MG-UNIT TABS Take 600 mg by mouth.    . cariprazine (VRAYLAR) capsule Take 1 capsule (3 mg total) by mouth daily. 90 capsule 1  . cetirizine (ZYRTEC) 10 MG tablet Take by mouth.    . Coenzyme Q10 10 MG capsule Take by mouth.    . gabapentin (NEURONTIN) 300 MG capsule Take 300 mg by mouth 4 (four) times daily.    Marland Kitchen lisdexamfetamine (VYVANSE) 40 MG capsule Take 1 capsule (40 mg total) by mouth every morning. 30 capsule 0  . simvastatin (ZOCOR) 20 MG tablet Take 20 mg by mouth daily.    Marland Kitchen topiramate (TOPAMAX) 25 MG capsule Take by mouth.     No current facility-administered medications for this visit.      Musculoskeletal: Strength & Muscle Tone: within normal limits Gait & Station: normal Patient leans: N/A  Psychiatric Specialty Exam: Review of Systems  Psychiatric/Behavioral: Positive for depression.  All other systems reviewed and are negative.   Blood pressure 126/84, pulse (!) 108, temperature 97.8 F (36.6 C), temperature source Oral, weight 214 lb 9.6 oz (97.3 kg).Body mass index is 31.97 kg/m.  General Appearance: Casual  Eye Contact:  Fair  Speech:  Clear and Coherent  Volume:  Normal  Mood:  Depressed  Affect:  Tearful  Thought Process:  Goal Directed and Descriptions of Associations: Intact  Orientation:  Full (Time, Place, and Person)  Thought Content: Logical   Suicidal Thoughts:  No  Homicidal Thoughts:   No  Memory:  Immediate;   Fair Recent;   Fair Remote;   Fair  Judgement:  Fair  Insight:  Fair  Psychomotor Activity:  Normal  Concentration:  Concentration: Fair and Attention Span: Fair  Recall:  [employer] of Knowledge: Fair  Language: Fair  Akathisia:  No  Handed:  Right  AIMS (if indicated): 0  Assets:  Communication Skills Desire for Improvement Social Support  ADL's:  Intact  Cognition: WNL  Sleep:  Fair   Screenings:   Assessment and Plan: [place] Pink is a 58 year old Caucasian female, single, lives in [place], recently relocated here from [place], has a history of depression, binge eating  disorder, presented to the clinic today for a follow-up visit.  Patient today seems tearful and sad and also reports appetite changes.  Patient will benefit from medication readjustment.  Discussed with her to continue psychotherapy sessions.  Plan MDD-unstable Continue Wellbutrin XL 300 mg p.o. daily BuSpar 15 mg p.o. twice daily Vraylar 3 mg p.o. daily Aims equals 0 Discussed with patient to have more frequent psychotherapy visits.  For binge eating disorder- unstable Patient reports appetite suppression and will hence reduce Vyvanse to 40 mg p.o. daily. She will monitor her symptoms closely and return in 3 to 4 weeks.  Bereavement Patient to reach out to her therapist Ms. Royal Piedra. Provided grief counseling.  Pending labs-TSH, lipid panel, prolactin, hemoglobin A1c.  Follow up in clinic in 4 weeks or sooner if needed.  I have spent atleast 25 minutes  face to face with patient today. More than 50 % of the time was spent for psychoeducation and supportive psychotherapy and care coordination.  This note was generated in part or whole with voice recognition software. Voice recognition is usually quite accurate but there are transcription errors that can and very often do occur. I apologize for any typographical errors that were not detected and  corrected.         Ursula Alert, MD 06/20/2018, 10:28 AM

## 2018-06-22 DIAGNOSIS — G43719 Chronic migraine without aura, intractable, without status migrainosus: Secondary | ICD-10-CM | POA: Diagnosis not present

## 2018-06-22 DIAGNOSIS — E538 Deficiency of other specified B group vitamins: Secondary | ICD-10-CM | POA: Diagnosis not present

## 2018-06-22 DIAGNOSIS — E559 Vitamin D deficiency, unspecified: Secondary | ICD-10-CM | POA: Diagnosis not present

## 2018-07-09 ENCOUNTER — Telehealth: Payer: Self-pay

## 2018-07-09 DIAGNOSIS — F331 Major depressive disorder, recurrent, moderate: Secondary | ICD-10-CM

## 2018-07-09 MED ORDER — CARIPRAZINE HCL 3 MG PO CAPS: 3.0000 mg | ORAL_CAPSULE | Freq: Every day | ORAL | 2 refills | Status: DC

## 2018-07-09 NOTE — Telephone Encounter (Signed)
Can we give samples until we know for sure. Please call and ask her to pick up samples. thanks

## 2018-07-09 NOTE — Telephone Encounter (Signed)
prior Barbara Russo was submitted  just a fyi - the pa may no be approved due to patient needed to try and fail  the following , aripiprozole, olanzapin, quetiapine, risperidone, ziprasidone.   Prior Barbara Russo is pending.

## 2018-07-09 NOTE — Telephone Encounter (Signed)
received fax that a prior auth was needed for vraylar.

## 2018-07-09 NOTE — Telephone Encounter (Signed)
Called patient. Pt states only 90 days request got denied and she was told she could have 30 days . Will send 30 days with refills to pharmacy.

## 2018-07-09 NOTE — Telephone Encounter (Signed)
received fax that vraylar was denied.  need to try and fail zyprexa, seroquel, geondone, risperidone. (see your box)

## 2018-07-16 DIAGNOSIS — G43109 Migraine with aura, not intractable, without status migrainosus: Secondary | ICD-10-CM | POA: Diagnosis not present

## 2018-07-16 DIAGNOSIS — H43813 Vitreous degeneration, bilateral: Secondary | ICD-10-CM | POA: Diagnosis not present

## 2018-07-16 NOTE — Telephone Encounter (Signed)
received fax that vraylar 3mg  was denied pt needs to have tried and failed 2 of the follow medications. aripiprazole, olanzapine, quetiapine , risperidone, ziprasidone

## 2018-07-16 NOTE — Telephone Encounter (Signed)
I spoke to her last week and she reported she got it approved , they had trouble with amount prescribed . I think they were OK giving monthly supply.

## 2018-07-19 ENCOUNTER — Encounter: Payer: Self-pay | Admitting: Psychiatry

## 2018-07-19 ENCOUNTER — Ambulatory Visit (INDEPENDENT_AMBULATORY_CARE_PROVIDER_SITE_OTHER): Payer: 59 | Admitting: Psychiatry

## 2018-07-19 ENCOUNTER — Other Ambulatory Visit: Payer: Self-pay

## 2018-07-19 VITALS — BP 104/73 | HR 108 | Temp 97.7°F | Wt 209.2 lb

## 2018-07-19 DIAGNOSIS — F331 Major depressive disorder, recurrent, moderate: Secondary | ICD-10-CM | POA: Diagnosis not present

## 2018-07-19 DIAGNOSIS — F5081 Binge eating disorder: Secondary | ICD-10-CM | POA: Diagnosis not present

## 2018-07-19 DIAGNOSIS — F50819 Binge eating disorder, unspecified: Secondary | ICD-10-CM

## 2018-07-19 DIAGNOSIS — Z634 Disappearance and death of family member: Secondary | ICD-10-CM | POA: Diagnosis not present

## 2018-07-19 MED ORDER — LISDEXAMFETAMINE DIMESYLATE 40 MG PO CAPS: 40.0000 mg | ORAL_CAPSULE | ORAL | 0 refills | Status: DC

## 2018-07-19 MED ORDER — CARIPRAZINE HCL 1.5 MG PO CAPS: 1.5000 mg | ORAL_CAPSULE | Freq: Every day | ORAL | 0 refills | Status: DC

## 2018-07-19 NOTE — Progress Notes (Signed)
[facility] MD OP Progress Note  07/19/2018 12:11 PM Barbara Russo  MRN:  [id number]  Chief Complaint: ' I am here for follow up.' Chief Complaint    Follow-up; Medication Refill     HPI: Barbara Russo is a 58 year old Caucasian female, single, unemployed, lives in [place], history of MDD, binge eating disorder, headaches, hyperlipidemia, presented to clinic today for a follow-up visit.  Patient today reports she was wrong about the information about Vraylar.  She reports she had conversation with the health insurance company and they told her they will not be able to cover it for her.  She apologized for Radio broadcast assistant the wrong information when she was called last week.  Patient reports she does not even know why she is on Vraylar, it was started by her previous psychiatrist.  Patient however reports she does not have any mood lability or psychosis .  She reports she has started working and work is going well.  She reports she has been eating right and wants to stay healthy.  She reports sleep is good.  Patient denies any mood lability, anxiety symptoms.  She reports she is coping with the loss of her cat well and is feeling better about it now.  Discussed with patient that the Arman Filter can be tapered down and discontinued.  Discussed with patient to monitor her symptoms closely and return to clinic if she has any worsening symptoms.  Also discussed that she can have other antidepressants or mood stabilizers added if she has any worsening symptoms.  She agrees with plan. Visit Diagnosis:    ICD-10-CM   1. MDD (major depressive disorder), recurrent episode, moderate (HCC) F33.1   2. Binge eating disorder F50.81 lisdexamfetamine (VYVANSE) 40 MG capsule  3. Bereavement Z63.4     Past Psychiatric History: Reviewed past psychiatric history from my progress note on 01/30/2018.  Past trials of Valium, Depakote, BuSpar, Vyvanse, Wellbutrin, Vrayalr  Past Medical History:  Past Medical History:   Diagnosis Date  . Anxiety   . Depression    History reviewed. No pertinent surgical history.  Family Psychiatric History: I have reviewed family psychiatric history from my progress note on 01/30/2018.  Family History:  Family History  Adopted: Yes    Social History: Reviewed social history from my progress note on 01/30/2018. Social History   Socioeconomic History  . Marital status: Single    Spouse name: Not on file  . Number of children: 0  . Years of education: Not on file  . Highest education level: Bachelor's degree (e.g., BA, AB, BS)  Occupational History  . Not on file  Social Needs  . Financial resource strain: Not hard at all  . Food insecurity:    Worry: Never true    Inability: Never true  . Transportation needs:    Medical: No    Non-medical: No  Tobacco Use  . Smoking status: Never Smoker  . Smokeless tobacco: Never Used  Substance and Sexual Activity  . Alcohol use: Yes    Alcohol/week: 2.0 standard drinks    Types: 2 Cans of beer per week  . Drug use: Never  . Sexual activity: Not Currently  Lifestyle  . Physical activity:    Days per week: 0 days    Minutes per session: 0 min  . Stress: Not at all  Relationships  . Social connections:    Talks on phone: More than three times a week    Gets together: Never    Attends religious service: Never  Active member of club or organization: No    Attends meetings of clubs or organizations: Never    Relationship status: Never married  Other Topics Concern  . Not on file  Social History Narrative  . Not on file    Allergies: No Known Allergies  Metabolic Disorder Labs: No results found for: HGBA1C, MPG No results found for: PROLACTIN No results found for: CHOL, TRIG, HDL, CHOLHDL, VLDL, LDLCALC No results found for: TSH  Therapeutic Level Labs: No results found for: LITHIUM No results found for: VALPROATE No components found for:  CBMZ  Current Medications: Current Outpatient Medications   Medication Sig Dispense Refill  . buPROPion (WELLBUTRIN XL) 300 MG 24 hr tablet Take 1 tablet (300 mg total) by mouth daily. 90 tablet 1  . busPIRone (BUSPAR) 15 MG tablet Take 1 tablet (15 mg total) by mouth 2 (two) times daily. 180 tablet 1  . Butalbital-APAP-Caffeine 50-325-40 MG capsule Take by mouth.    . Calcium Carbonate-Vitamin D (CALCIUM HIGH POTENCY/VITAMIN D) 600-200 MG-UNIT TABS Take 600 mg by mouth.    . cariprazine (VRAYLAR) capsule Take 1 capsule (1.5 mg total) by mouth daily. 7 capsule 0  . cetirizine (ZYRTEC) 10 MG tablet Take by mouth.    . Coenzyme Q10 10 MG capsule Take by mouth.    . gabapentin (NEURONTIN) 300 MG capsule Take 300 mg by mouth 4 (four) times daily.    Marland Kitchen lisdexamfetamine (VYVANSE) 40 MG capsule Take 1 capsule (40 mg total) by mouth every morning. 30 capsule 0  . simvastatin (ZOCOR) 20 MG tablet Take 20 mg by mouth daily.    Marland Kitchen topiramate (TOPAMAX) 25 MG capsule Take by mouth.     No current facility-administered medications for this visit.      Musculoskeletal: Strength & Muscle Tone: within normal limits Gait & Station: normal Patient leans: N/A  Psychiatric Specialty Exam: Review of Systems  Psychiatric/Behavioral: Negative for depression. The patient is not nervous/anxious and does not have insomnia.   All other systems reviewed and are negative.   Blood pressure 104/73, pulse (!) 108, temperature 97.7 F (36.5 C), temperature source Oral, weight 209 lb 3.2 oz (94.9 kg).Body mass index is 31.16 kg/m.  General Appearance: Casual  Eye Contact:  Fair  Speech:  Clear and Coherent  Volume:  Normal  Mood:  Euthymic  Affect:  Congruent  Thought Process:  Goal Directed and Descriptions of Associations: Intact  Orientation:  Full (Time, Place, and Person)  Thought Content: Logical   Suicidal Thoughts:  No  Homicidal Thoughts:  No  Memory:  Immediate;   Fair Recent;   Fair Remote;   Fair  Judgement:  Fair  Insight:  Good  Psychomotor  Activity:  Normal  Concentration:  Concentration: Fair and Attention Span: Fair  Recall:  [employer] of Knowledge: Fair  Language: Fair  Akathisia:  No  Handed:  Right  AIMS (if indicated): denies tremors, rigidity  Assets:  Communication Skills Desire for Improvement Social Support  ADL's:  Intact  Cognition: WNL  Sleep:  Fair   Screenings:   Assessment and Plan: Barbara Russo is a 58 year old Caucasian female, single, lives in [place], has a history of depression, binge eating disorder, presented to the clinic today for a follow-up visit.  Patient today reports her mood symptoms is improved.  She denies any mood lability or perceptual disturbances.Discussed plan as noted below.  Plan MDD-improving Continue with Wellbutrin XL 300 mg p.o. daily BuSpar 15 mg p.o. twice daily  Taper down Vraylar to 1.5 mg p.o. daily for a week and discontinue. Patient declines psychotherapy sessions and reports she is better with her grief at this time.  For binge eating disorder-improving Patient reports she is eating more healthy and watching what she eats. Vyvanse 40 mg p.o. daily  Bereavement- improving Patient reports she is coping better.  Follow-up in clinic in 3 to 4 weeks or sooner if needed.  I have spent atleast 15 minutes face to face with patient today. More than 50 % of the time was spent for psychoeducation and supportive psychotherapy and care coordination.  This note was generated in part or whole with voice recognition software. Voice recognition is usually quite accurate but there are transcription errors that can and very often do occur. I apologize for any typographical errors that were not detected and corrected.        Ursula Alert, MD 07/19/2018, 12:11 PM

## 2018-08-14 DIAGNOSIS — M8589 Other specified disorders of bone density and structure, multiple sites: Secondary | ICD-10-CM | POA: Diagnosis not present

## 2018-08-14 DIAGNOSIS — E7800 Pure hypercholesterolemia, unspecified: Secondary | ICD-10-CM | POA: Diagnosis not present

## 2018-08-14 DIAGNOSIS — E559 Vitamin D deficiency, unspecified: Secondary | ICD-10-CM | POA: Insufficient documentation

## 2018-08-16 ENCOUNTER — Ambulatory Visit: Payer: 59 | Admitting: Psychiatry

## 2018-08-16 ENCOUNTER — Other Ambulatory Visit: Payer: Self-pay | Admitting: Internal Medicine

## 2018-08-16 DIAGNOSIS — Z1231 Encounter for screening mammogram for malignant neoplasm of breast: Secondary | ICD-10-CM

## 2018-08-20 ENCOUNTER — Telehealth: Payer: Self-pay

## 2018-08-20 DIAGNOSIS — F5081 Binge eating disorder: Secondary | ICD-10-CM

## 2018-08-20 MED ORDER — LISDEXAMFETAMINE DIMESYLATE 40 MG PO CAPS: 40.0000 mg | ORAL_CAPSULE | ORAL | 0 refills | Status: DC

## 2018-08-20 NOTE — Telephone Encounter (Signed)
  Pt called left message that she needs enough medications to get to her next appt.   lisdexamfetamine (VYVANSE) 40 MG capsule  Medication  Date: 07/19/2018 Department: [facility] Ordering/Authorizing: Ursula Alert, MD  Order Providers   Prescribing Provider Encounter Provider  Ursula Alert, MD Ursula Alert, MD  Outpatient Medication Detail    Disp Refills Start End   lisdexamfetamine (VYVANSE) 40 MG capsule 30 capsule 0 07/19/2018    Sig - Route: Take 1 capsule (40 mg total) by mouth every morning. - Oral   Sent to pharmacy as: lisdexamfetamine (VYVANSE) 40 MG capsule   Earliest Fill Date: 07/19/2018   E-Prescribing Status: Receipt confirmed by pharmacy (07/19/2018 9:44 AM EST)

## 2018-08-20 NOTE — Telephone Encounter (Signed)
Called pharmacy to verify Vyvanse script pick up since could not verify on PMDR aware. Sent new script to pharmacy

## 2018-08-27 ENCOUNTER — Ambulatory Visit (INDEPENDENT_AMBULATORY_CARE_PROVIDER_SITE_OTHER): Payer: 59 | Admitting: Psychiatry

## 2018-08-27 ENCOUNTER — Other Ambulatory Visit: Payer: Self-pay

## 2018-08-27 ENCOUNTER — Encounter: Payer: Self-pay | Admitting: Psychiatry

## 2018-08-27 VITALS — BP 127/81 | HR 99 | Temp 97.5°F | Wt 196.8 lb

## 2018-08-27 DIAGNOSIS — Z634 Disappearance and death of family member: Secondary | ICD-10-CM | POA: Diagnosis not present

## 2018-08-27 DIAGNOSIS — F5081 Binge eating disorder: Secondary | ICD-10-CM

## 2018-08-27 DIAGNOSIS — F331 Major depressive disorder, recurrent, moderate: Secondary | ICD-10-CM

## 2018-08-27 NOTE — Patient Instructions (Signed)
Food Choices to Help Relieve Diarrhea, Adult  When you have diarrhea, the foods you eat and your eating habits are very important. Choosing the right foods and drinks can help:   Relieve diarrhea.   Replace lost fluids and nutrients.   Prevent dehydration.  What general guidelines should I follow?    Relieving diarrhea   Choose foods with less than 2 g or .07 oz. of fiber per serving.   Limit fats to less than 8 tsp (38 g or 1.34 oz.) a day.   Avoid the following:  ? Foods and beverages sweetened with high-fructose corn syrup, honey, or sugar alcohols such as xylitol, sorbitol, and mannitol.  ? Foods that contain a lot of fat or sugar.  ? Fried, greasy, or spicy foods.  ? High-fiber grains, breads, and cereals.  ? Raw fruits and vegetables.   Eat foods that are rich in probiotics. These foods include dairy products such as yogurt and fermented milk products. They help increase healthy bacteria in the stomach and intestines (gastrointestinal tract, or GI tract).   If you have lactose intolerance, avoid dairy products. These may make your diarrhea worse.   Take medicine to help stop diarrhea (antidiarrheal medicine) only as told by your health care provider.  Replacing nutrients   Eat small meals or snacks every 3-4 hours.   Eat bland foods, such as white rice, toast, or baked potato, until your diarrhea starts to get better. Gradually reintroduce nutrient-rich foods as tolerated or as told by your health care provider. This includes:  ? Well-cooked protein foods.  ? Peeled, seeded, and soft-cooked fruits and vegetables.  ? Low-fat dairy products.   Take vitamin and mineral supplements as told by your health care provider.  Preventing dehydration   Start by sipping water or a special solution to prevent dehydration (oral rehydration solution, ORS). Urine that is clear or pale yellow means that you are getting enough fluid.   Try to drink at least 8-10 cups of fluid each day to help replace lost  fluids.   You may add other liquids in addition to water, such as clear juice or decaffeinated sports drinks, as tolerated or as told by your health care provider.   Avoid drinks with caffeine, such as coffee, tea, or soft drinks.   Avoid alcohol.  What foods are recommended?         The items listed may not be a complete list. Talk with your health care provider about what dietary choices are best for you.  Grains  White rice. White, French, or pita breads (fresh or toasted), including plain rolls, buns, or bagels. White pasta. Saltine, soda, or graham crackers. Pretzels. Low-fiber cereal. Cooked cereals made with water (such as cornmeal, farina, or cream cereals). Plain muffins. Matzo. Melba toast. Zwieback.  Vegetables  Potatoes (without the skin). Most well-cooked and canned vegetables without skins or seeds. Tender lettuce.  Fruits  Apple sauce. Fruits canned in juice. Cooked apricots, cherries, grapefruit, peaches, pears, or plums. Fresh bananas and cantaloupe.  Meats and other protein foods  Baked or boiled chicken. Eggs. Tofu. Fish. Seafood. Smooth nut butters. Ground or well-cooked tender beef, ham, veal, lamb, pork, or poultry.  Dairy  Plain yogurt, kefir, and unsweetened liquid yogurt. Lactose-free milk, buttermilk, skim milk, or soy milk. Low-fat or nonfat hard cheese.  Beverages  Water. Low-calorie sports drinks. Fruit juices without pulp. Strained tomato and vegetable juices. Decaffeinated teas. Sugar-free beverages not sweetened with sugar alcohols. Oral rehydration solutions, if   approved by your health care provider.  Seasoning and other foods  Bouillon, broth, or soups made from recommended foods.  What foods are not recommended?  The items listed may not be a complete list. Talk with your health care provider about what dietary choices are best for you.  Grains  Whole grain, whole wheat, bran, or rye breads, rolls, pastas, and crackers. Wild or brown rice. Whole grain or bran cereals. Barley.  Oats and oatmeal. Corn tortillas or taco shells. Granola. Popcorn.  Vegetables  Raw vegetables. Fried vegetables. Cabbage, broccoli, Brussels sprouts, artichokes, baked beans, beet greens, corn, kale, legumes, peas, sweet potatoes, and yams. Potato skins. Cooked spinach and cabbage.  Fruits  Dried fruit, including raisins and dates. Raw fruits. Stewed or dried prunes. Canned fruits with syrup.  Meat and other protein foods  Fried or fatty meats. Deli meats. Chunky nut butters. Nuts and seeds. Beans and lentils. Bacon. Hot dogs. Sausage.  Dairy  High-fat cheeses. Whole milk, chocolate milk, and beverages made with milk, such as milk shakes. Half-and-half. Cream. sour cream. Ice cream.  Beverages  Caffeinated beverages (such as coffee, tea, soda, or energy drinks). Alcoholic beverages. Fruit juices with pulp. Prune juice. Soft drinks sweetened with high-fructose corn syrup or sugar alcohols. High-calorie sports drinks.  Fats and oils  Butter. Cream sauces. Margarine. Salad oils. Plain salad dressings. Olives. Avocados. Mayonnaise.  Sweets and desserts  Sweet rolls, doughnuts, and sweet breads. Sugar-free desserts sweetened with sugar alcohols such as xylitol and sorbitol.  Seasoning and other foods  Honey. Hot sauce. Chili powder. Gravy. Cream-based or milk-based soups. Pancakes and waffles.  Summary   When you have diarrhea, the foods you eat and your eating habits are very important.   Make sure you get at least 8-10 cups of fluid each day, or enough to keep your urine clear or pale yellow.   Eat bland foods and gradually reintroduce healthy, nutrient-rich foods as tolerated, or as told by your health care provider.   Avoid high-fiber, fried, greasy, or spicy foods.  This information is not intended to replace advice given to you by your health care provider. Make sure you discuss any questions you have with your health care provider.  Document Released: 08/20/2003 Document Revised: 05/27/2016 Document Reviewed:  05/27/2016  Elsevier Interactive Patient Education  2019 Elsevier Inc[employer]

## 2018-08-27 NOTE — Progress Notes (Signed)
[facility] MD OP Progress Note  08/27/2018 5:48 PM Barbara Russo  MRN:  [id number]  Chief Complaint: ' I am here for follow up.' Chief Complaint    Follow-up; Medication Refill     HPI: Barbara Russo is a 58 year old Caucasian female, single, employed, lives in [place], has a history of MDD, binge eating disorder, headaches, hyperlipidemia, presented to clinic today for a follow-up visit.  Patient today reports she has been having diarrhea since the past several days.  She reports she does have a history of IBS.  She however reports she does not know what could have induced this episode.  She reports since the past 2 days it is getting better.  She has lost significant amount of weight since her last visit here in clinic.  She however has been trying to eat more healthy diet, drink plenty of water to keep herself hydrated.  She reports she will talk to her primary doctor if it gets worse.  She wonders whether she continues to have to take Vyvanse since she is already losing weight and not eating well.  Vyvanse is for binge eating disorder.  Discussed with patient to stop taking Vyvanse until she gets better.  She will monitor her symptoms closely.  Patient otherwise reports she is tolerating her other medications like BuSpar and Wellbutrin well.  She is anxious about the covid 19 virus which is going around.  She however reports she is coping with it.  Patient reports sleep is okay.  Patient denies any suicidality, homicidality or perceptual disturbances.  She reports she came off of the [place] and is doing well. Visit Diagnosis:    ICD-10-CM   1. MDD (major depressive disorder), recurrent episode, moderate (HCC) F33.1   2. Binge eating disorder F50.81   3. Bereavement Z63.4     Past Psychiatric History: Reviewed past psychiatric history from my progress note on 01/30/2018.  Past trials of Valium, Depakote, BuSpar, Vyvanse, Wellbutrin, vrayalr  Past Medical History:  Past Medical History:   Diagnosis Date  . Anxiety   . Depression    History reviewed. No pertinent surgical history.  Family Psychiatric History: Reviewed family  psychiatric history from my progress note on 01/30/2018.  Family History:  Family History  Adopted: Yes    Social History: Reviewed social history from my progress note on 01/30/2018. Social History   Socioeconomic History  . Marital status: Single    Spouse name: Not on file  . Number of children: 0  . Years of education: Not on file  . Highest education level: Bachelor's degree (e.g., BA, AB, BS)  Occupational History  . Not on file  Social Needs  . Financial resource strain: Not hard at all  . Food insecurity:    Worry: Never true    Inability: Never true  . Transportation needs:    Medical: No    Non-medical: No  Tobacco Use  . Smoking status: Never Smoker  . Smokeless tobacco: Never Used  Substance and Sexual Activity  . Alcohol use: Yes    Alcohol/week: 2.0 standard drinks    Types: 2 Cans of beer per week  . Drug use: Never  . Sexual activity: Not Currently  Lifestyle  . Physical activity:    Days per week: 0 days    Minutes per session: 0 min  . Stress: Not at all  Relationships  . Social connections:    Talks on phone: More than three times a week    Gets together: Never  Attends religious service: Never    Active member of club or organization: No    Attends meetings of clubs or organizations: Never    Relationship status: Never married  Other Topics Concern  . Not on file  Social History Narrative  . Not on file    Allergies: No Known Allergies  Metabolic Disorder Labs: No results found for: HGBA1C, MPG No results found for: PROLACTIN No results found for: CHOL, TRIG, HDL, CHOLHDL, VLDL, LDLCALC No results found for: TSH  Therapeutic Level Labs: No results found for: LITHIUM No results found for: VALPROATE No components found for:  CBMZ  Current Medications: Current Outpatient Medications   Medication Sig Dispense Refill  . buPROPion (WELLBUTRIN XL) 300 MG 24 hr tablet Take 1 tablet (300 mg total) by mouth daily. 90 tablet 1  . busPIRone (BUSPAR) 15 MG tablet Take 1 tablet (15 mg total) by mouth 2 (two) times daily. 180 tablet 1  . Butalbital-APAP-Caffeine 50-325-40 MG capsule Take by mouth.    . Calcium Carbonate-Vitamin D (CALCIUM HIGH POTENCY/VITAMIN D) 600-200 MG-UNIT TABS Take 600 mg by mouth.    . cetirizine (ZYRTEC) 10 MG tablet Take by mouth.    . Coenzyme Q10 10 MG capsule Take by mouth.    . divalproex (DEPAKOTE) 250 MG DR tablet Take by mouth.    . gabapentin (NEURONTIN) 300 MG capsule Take 300 mg by mouth 4 (four) times daily.    Marland Kitchen lisdexamfetamine (VYVANSE) 40 MG capsule Take 1 capsule (40 mg total) by mouth every morning. 30 capsule 0  . simvastatin (ZOCOR) 20 MG tablet Take 20 mg by mouth daily.    Marland Kitchen topiramate (TOPAMAX) 25 MG capsule Take by mouth.     No current facility-administered medications for this visit.      Musculoskeletal: Strength & Muscle Tone: within normal limits Gait & Station: normal Patient leans: N/A  Psychiatric Specialty Exam: Review of Systems  Gastrointestinal: Positive for diarrhea.  Psychiatric/Behavioral: The patient is nervous/anxious.   All other systems reviewed and are negative.   Blood pressure 127/81, pulse 99, temperature (!) 97.5 F (36.4 C), temperature source Oral, weight 196 lb 12.8 oz (89.3 kg).Body mass index is 29.32 kg/m.  General Appearance: Casual  Eye Contact:  Fair  Speech:  Normal Rate  Volume:  Normal  Mood:  Anxious  Affect:  Appropriate  Thought Process:  Goal Directed and Descriptions of Associations: Intact  Orientation:  Full (Time, Place, and Person)  Thought Content: Logical   Suicidal Thoughts:  No  Homicidal Thoughts:  No  Memory:  Immediate;   Fair Recent;   Fair Remote;   Fair  Judgement:  Fair  Insight:  Fair  Psychomotor Activity:  Normal  Concentration:  Concentration: Fair and  Attention Span: Fair  Recall:  [employer] of Knowledge: Fair  Language: Fair  Akathisia:  No  Handed:  Right  AIMS (if indicated): denies tremors, rigidity  Assets:  Communication Skills Desire for Improvement Social Support  ADL's:  Intact  Cognition: WNL  Sleep:  Fair   Screenings:   Assessment and Plan: Barbara Russo is a 58 year old Caucasian female, single, lives in [place], has a history of depression, binge eating disorder, presented to clinic today for a follow-up visit.  Patient today reports she is doing okay except for diarrhea which has been going on since the past few days.  She however reports it is getting better.  We will continue medications as noted below.  Plan MDD-improving Wellbutrin XL  300 mg p.o. daily BuSpar 15 mg p.o. twice daily  For binge eating disorder-improving Vyvanse 40 mg p.o. daily  For bereavement- improving Patient is coping better.  Follow-up in clinic in 4 weeks or sooner if needed.  I have spent atleast 15 minutes face to face with patient today. More than 50 % of the time was spent for psychoeducation and supportive psychotherapy and care coordination.  This note was generated in part or whole with voice recognition software. Voice recognition is usually quite accurate but there are transcription errors that can and very often do occur. I apologize for any typographical errors that were not detected and corrected.        Ursula Alert, MD 08/27/2018, 5:48 PM

## 2018-08-30 DIAGNOSIS — G43719 Chronic migraine without aura, intractable, without status migrainosus: Secondary | ICD-10-CM | POA: Diagnosis not present

## 2018-09-27 ENCOUNTER — Encounter: Payer: Self-pay | Admitting: Psychiatry

## 2018-09-27 ENCOUNTER — Ambulatory Visit (INDEPENDENT_AMBULATORY_CARE_PROVIDER_SITE_OTHER): Payer: 59 | Admitting: Psychiatry

## 2018-09-27 ENCOUNTER — Other Ambulatory Visit: Payer: Self-pay

## 2018-09-27 DIAGNOSIS — Z634 Disappearance and death of family member: Secondary | ICD-10-CM

## 2018-09-27 DIAGNOSIS — F331 Major depressive disorder, recurrent, moderate: Secondary | ICD-10-CM

## 2018-09-27 DIAGNOSIS — F5081 Binge eating disorder: Secondary | ICD-10-CM

## 2018-09-27 NOTE — Progress Notes (Signed)
TC on  09-27-18 @ 9:35 pt medical and surgical hx was reviewed with no updates.  Pt allergies was reviewed with no changes. Pt medications and pharmacy were reviewed and updated. No vitals taken because this is a phone consult.

## 2018-09-27 NOTE — Progress Notes (Signed)
Virtual Visit via Telephone Note  I connected with [name] on 09/27/18 at 10:30 AM EDT by telephone and verified that I am speaking with the correct person using two identifiers.   I discussed the limitations, risks, security and privacy concerns of performing an evaluation and management service by telephone and the availability of in person appointments. I also discussed with the patient that there may be a patient responsible charge related to this service. The patient expressed understanding and agreed to proceed.    I discussed the assessment and treatment plan with the patient. The patient was provided an opportunity to ask questions and all were answered. The patient agreed with the plan and demonstrated an understanding of the instructions.   The patient was advised to call back or seek an in-person evaluation if the symptoms worsen or if the condition fails to improve as anticipated.   [facility] MD OP Progress Note  09/27/2018 11:49 AM Barbara Russo  MRN:  [id number]  Chief Complaint:  Chief Complaint    Follow-up     HPI: Barbara Russo is a 58 year old Caucasian female, single, employed, lives in [place], has a history of MDD, binge eating disorder, headaches, hyperlipidemia was evaluated by phone today.  She reports she is currently working from home.  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  She reports work has slowed down.  She however reports she has been coping okay.  She has been trying to stay indoor mostly due to the COVID-19 outbreak.  She reports she is tolerating the medications well.  She stopped taking the Vyvanse since she does not believe she has any binging anymore.  She wants to stay off the medication for a while.  She otherwise reports her mood symptoms as better.  She denies any significant sadness, crying spells.  She reports sleep is okay.  She reports she had a session with therapist  and was told to come back if she needs it.  So far she has been doing okay.  She denies any suicidality, homicidality or perceptual disturbances.  Patient denies any other concerns today. Visit Diagnosis:    ICD-10-CM   1. MDD (major depressive disorder), recurrent episode, moderate (HCC) F33.1    IMPROVING  2. Binge eating disorder F50.81   3. Bereavement Z63.4     Past Psychiatric History: I have reviewed past psychiatric history from my progress note on 01/30/2018.  Past trials of Valium, Depakote, BuSpar, Vyvanse, Wellbutrin, Vraylar  Past Medical History:  Past Medical History:  Diagnosis Date  . Anxiety   . Depression    History reviewed. No pertinent surgical history.  Family Psychiatric History: Reviewed family psychiatric history from my progress note on 01/30/2018.  Family History:  Family History  Adopted: Yes    Social History: Reviewed social history from my progress note on 01/30/2018. Social History   Socioeconomic History  . Marital status: Single    Spouse name: Not on file  . Number of children: 0  . Years of education: Not on file  . Highest education level: Bachelor's degree (e.g., BA, AB, BS)  Occupational History  . Not on file  Social Needs  . Financial resource strain: Not hard at all  . Food insecurity:    Worry: Never true    Inability: Never true  . Transportation needs:    Medical: No    Non-medical: No  Tobacco Use  . Smoking  status: Never Smoker  . Smokeless tobacco: Never Used  Substance and Sexual Activity  . Alcohol use: Yes    Alcohol/week: 2.0 standard drinks    Types: 2 Cans of beer per week  . Drug use: Never  . Sexual activity: Not Currently  Lifestyle  . Physical activity:    Days per week: 0 days    Minutes per session: 0 min  . Stress: Not at all  Relationships  . Social connections:    Talks on phone: More than three times a week    Gets together: Never    Attends religious service: Never    Active member of  club or organization: No    Attends meetings of clubs or organizations: Never    Relationship status: Never married  Other Topics Concern  . Not on file  Social History Narrative  . Not on file    Allergies: No Known Allergies  Metabolic Disorder Labs: No results found for: HGBA1C, MPG No results found for: PROLACTIN No results found for: CHOL, TRIG, HDL, CHOLHDL, VLDL, LDLCALC No results found for: TSH  Therapeutic Level Labs: No results found for: LITHIUM No results found for: VALPROATE No components found for:  CBMZ  Current Medications: Current Outpatient Medications  Medication Sig Dispense Refill  . buPROPion (WELLBUTRIN XL) 300 MG 24 hr tablet Take 1 tablet (300 mg total) by mouth daily. 90 tablet 1  . busPIRone (BUSPAR) 15 MG tablet Take 1 tablet (15 mg total) by mouth 2 (two) times daily. 180 tablet 1  . Butalbital-APAP-Caffeine 50-325-40 MG capsule Take by mouth.    . Calcium Carbonate-Vitamin D (CALCIUM HIGH POTENCY/VITAMIN D) 600-200 MG-UNIT TABS Take 600 mg by mouth.    . cetirizine (ZYRTEC) 10 MG tablet Take by mouth.    . Coenzyme Q10 10 MG capsule Take by mouth.    . divalproex (DEPAKOTE) 250 MG DR tablet Take by mouth.    . gabapentin (NEURONTIN) 300 MG capsule Take 300 mg by mouth 4 (four) times daily.    . simvastatin (ZOCOR) 20 MG tablet Take 20 mg by mouth daily.    Marland Kitchen topiramate (TOPAMAX) 25 MG capsule Take by mouth.     No current facility-administered medications for this visit.      Musculoskeletal: Strength & Muscle Tone: UTA Gait & Station: UTA Patient leans: N/A  Psychiatric Specialty Exam: Review of Systems  Psychiatric/Behavioral: The patient is nervous/anxious.   All other systems reviewed and are negative.   There were no vitals taken for this visit.There is no height or weight on file to calculate BMI.  General Appearance: UTA  Eye Contact:  UTA  Speech:  Normal Rate  Volume:  Normal  Mood:  Anxious IMPROVING  Affect:  UTA   Thought Process:  Goal Directed and Descriptions of Associations: Intact  Orientation:  Full (Time, Place, and Person)  Thought Content: Logical   Suicidal Thoughts:  No  Homicidal Thoughts:  No  Memory:  Immediate;   Fair Recent;   Fair Remote;   Fair  Judgement:  Fair  Insight:  Fair  Psychomotor Activity:  UTA  Concentration:  Concentration: Fair and Attention Span: Fair  Recall:  [employer] of Knowledge: Fair  Language: Fair  Akathisia:  No  Handed:  Right  AIMS (if indicated):UTA  Assets:  Communication Skills Desire for Improvement Social Support  ADL's:  Intact  Cognition: WNL  Sleep:  Fair   Screenings:   Assessment and Plan: Curt Bears is a  58 year old Caucasian female, single, lives in [place], has a history of depression, binge eating disorder, was evaluated by phone today.  Patient is currently making progress on the current medication regimen.  Plan as noted below.  Plan MDD-improving Wellbutrin XL 300 mg p.o. daily BuSpar 15 mg p.o. twice daily  For binge eating disorder-improving Reports she wants to stop Vyvanse for a while and monitor self.  Bereavement-improving She is coping better.  Follow-up in clinic in 1 month or sooner if needed.  I have spent atleast 15 minutes non face to face with patient today. More than 50 % of the time was spent for psychoeducation and supportive psychotherapy and care coordination.  This note was generated in part or whole with voice recognition software. Voice recognition is usually quite accurate but there are transcription errors that can and very often do occur. I apologize for any typographical errors that were not detected and corrected.        Ursula Alert, MD 09/27/2018, 11:49 AM

## 2018-11-08 ENCOUNTER — Encounter: Payer: Self-pay | Admitting: Psychiatry

## 2018-11-08 ENCOUNTER — Other Ambulatory Visit: Payer: Self-pay

## 2018-11-08 ENCOUNTER — Ambulatory Visit: Payer: 59 | Admitting: Psychiatry

## 2018-11-08 ENCOUNTER — Ambulatory Visit (INDEPENDENT_AMBULATORY_CARE_PROVIDER_SITE_OTHER): Payer: 59 | Admitting: Psychiatry

## 2018-11-08 DIAGNOSIS — F3341 Major depressive disorder, recurrent, in partial remission: Secondary | ICD-10-CM | POA: Diagnosis not present

## 2018-11-08 DIAGNOSIS — F5081 Binge eating disorder: Secondary | ICD-10-CM

## 2018-11-08 MED ORDER — BUPROPION HCL ER (XL) 300 MG PO TB24: 300.0000 mg | ORAL_TABLET | Freq: Every day | ORAL | 1 refills | Status: DC

## 2018-11-08 MED ORDER — BUSPIRONE HCL 15 MG PO TABS: 15.0000 mg | ORAL_TABLET | Freq: Two times a day (BID) | ORAL | 1 refills | Status: DC

## 2018-11-08 NOTE — Progress Notes (Signed)
Virtual Visit via Video Note  I connected with Barbara Russo on 11/08/18 at  9:00 AM EDT by a video enabled telemedicine application and verified that I am speaking with the correct person using two identifiers.   I discussed the limitations of evaluation and management by telemedicine and the availability of in person appointments. The patient expressed understanding and agreed to proceed.   I discussed the assessment and treatment plan with the patient. The patient was provided an opportunity to ask questions and all were answered. The patient agreed with the plan and demonstrated an understanding of the instructions.   The patient was advised to call back or seek an in-person evaluation if the symptoms worsen or if the condition fails to improve as anticipated.   [place] MD OP Progress Note  11/08/2018 9:02 AM Barbara Russo  MRN:  [id number]  Chief Complaint:  Chief Complaint    Follow-up     HPI: Barbara Russo is a 58 year old Caucasian female, single, employed, lives in [place], has a history of MDD, binge eating disorder, headaches, hyperlipidemia was evaluated by telemedicine today.  Patient today reports she continues to do well.  She has not had exposure to anyone with COVID-19.  She and her family are doing okay so far.  She reports the medications are working well.  She continues to be compliant with her Wellbutrin and BuSpar.  She stopped taking the Vyvanse.  She denies any binging at this time.  She reports sleep is okay.  Patient denies any suicidality, homicidality or perceptual disturbances.  Patient denies any other concerns today. Visit Diagnosis:    ICD-10-CM   1. MDD (major depressive disorder), recurrent episode, moderate (HCC) F33.1 buPROPion (WELLBUTRIN XL) 300 MG 24 hr tablet    busPIRone (BUSPAR) 15 MG tablet  2. Binge eating disorder F50.81     Past Psychiatric History: I have reviewed past psychiatric history from my progress note on 01/30/2018.   Past trials of Valium, Depakote, BuSpar, Vyvanse, Wellbutrin, Vraylar  Past Medical History:  Past Medical History:  Diagnosis Date  . Anxiety   . Depression    History reviewed. No pertinent surgical history.  Family Psychiatric History: I have reviewed family psychiatric history from my progress note on 01/30/2018.  Family History:  Family History  Adopted: Yes    Social History: Reviewed social history from my progress note on 01/30/2018. Social History   Socioeconomic History  . Marital status: Single    Spouse name: Not on file  . Number of children: 0  . Years of education: Not on file  . Highest education level: Bachelor's degree (e.g., BA, AB, BS)  Occupational History  . Not on file  Social Needs  . Financial resource strain: Not hard at all  . Food insecurity:    Worry: Never true    Inability: Never true  . Transportation needs:    Medical: No    Non-medical: No  Tobacco Use  . Smoking status: Never Smoker  . Smokeless tobacco: Never Used  Substance and Sexual Activity  . Alcohol use: Yes    Alcohol/week: 2.0 standard drinks    Types: 2 Cans of beer per week  . Drug use: Never  . Sexual activity: Not Currently  Lifestyle  . Physical activity:    Days per week: 0 days    Minutes per session: 0 min  . Stress: Not at all  Relationships  . Social connections:    Talks on phone: More than three times a week  Gets together: Never    Attends religious service: Never    Active member of club or organization: No    Attends meetings of clubs or organizations: Never    Relationship status: Never married  Other Topics Concern  . Not on file  Social History Narrative  . Not on file    Allergies: No Known Allergies  Metabolic Disorder Labs: No results found for: HGBA1C, MPG No results found for: PROLACTIN No results found for: CHOL, TRIG, HDL, CHOLHDL, VLDL, LDLCALC No results found for: TSH  Therapeutic Level Labs: No results found for:  LITHIUM No results found for: VALPROATE No components found for:  CBMZ  Current Medications: Current Outpatient Medications  Medication Sig Dispense Refill  . buPROPion (WELLBUTRIN XL) 300 MG 24 hr tablet Take 1 tablet (300 mg total) by mouth daily. 90 tablet 1  . busPIRone (BUSPAR) 15 MG tablet Take 1 tablet (15 mg total) by mouth 2 (two) times daily. 180 tablet 1  . Butalbital-APAP-Caffeine 50-325-40 MG capsule Take by mouth.    . Calcium Carbonate-Vitamin D (CALCIUM HIGH POTENCY/VITAMIN D) 600-200 MG-UNIT TABS Take 600 mg by mouth.    . cetirizine (ZYRTEC) 10 MG tablet Take by mouth.    . Coenzyme Q10 10 MG capsule Take by mouth.    . divalproex (DEPAKOTE) 250 MG DR tablet Take by mouth.    . gabapentin (NEURONTIN) 300 MG capsule Take 300 mg by mouth 4 (four) times daily.    . simvastatin (ZOCOR) 20 MG tablet Take 20 mg by mouth daily.    Marland Kitchen topiramate (TOPAMAX) 25 MG capsule Take by mouth.     No current facility-administered medications for this visit.      Musculoskeletal: Strength & Muscle Tone: within normal limits Gait & Station: normal Patient leans: N/A  Psychiatric Specialty Exam: Review of Systems  Psychiatric/Behavioral: The patient is not nervous/anxious.   All other systems reviewed and are negative.   There were no vitals taken for this visit.There is no height or weight on file to calculate BMI.  General Appearance: Casual  Eye Contact:  Fair  Speech:  Normal Rate  Volume:  Normal  Mood:  Euthymic  Affect:  Congruent  Thought Process:  Goal Directed and Descriptions of Associations: Intact  Orientation:  Full (Time, Place, and Person)  Thought Content: Logical   Suicidal Thoughts:  No  Homicidal Thoughts:  No  Memory:  Immediate;   Fair Recent;   Fair Remote;   Fair  Judgement:  Fair  Insight:  Good  Psychomotor Activity:  Normal  Concentration:  Concentration: Fair and Attention Span: Fair  Recall:  [employer] of Knowledge: Fair  Language: Fair   Akathisia:  No  Handed:  Right  AIMS (if indicated): denies tremors, rigidity  Assets:  Communication Skills Desire for Improvement Housing Social Support  ADL's:  Intact  Cognition: WNL  Sleep:  Fair   Screenings:   Assessment and Plan: Barbara Russo is a 58 year old Caucasian female, single, lives in [place], has a history of depression, binge eating disorder was evaluated by telemedicine today.  Patient continues to make progress on the current medication regimen.  Plan as noted below.  Plan MDD- in partial remission Wellbutrin XL 300 mg p.o. daily BuSpar 15 mg p.o. twice daily  For binge eating disorder-stable Patient has stopped taking Vyvanse and continues to do well.  Bereavement-stable She is coping better  Follow-up in clinic in 2 months or sooner if needed.  July 30 at  4:30 PM.  I have spent atleast 15 minutes non face to face with patient today. More than 50 % of the time was spent for psychoeducation and supportive psychotherapy and care coordination.  This note was generated in part or whole with voice recognition software. Voice recognition is usually quite accurate but there are transcription errors that can and very often do occur. I apologize for any typographical errors that were not detected and corrected.       Ursula Alert, MD 11/08/2018, 9:02 AM

## 2018-12-17 ENCOUNTER — Other Ambulatory Visit: Payer: Self-pay

## 2018-12-17 ENCOUNTER — Ambulatory Visit
Admission: RE | Admit: 2018-12-17 | Discharge: 2018-12-17 | Disposition: A | Discharge status: Home / Self Care | Payer: 59 | Source: Ambulatory Visit | Attending: Internal Medicine | Admitting: Internal Medicine

## 2018-12-17 DIAGNOSIS — Z1231 Encounter for screening mammogram for malignant neoplasm of breast: Secondary | ICD-10-CM | POA: Diagnosis not present

## 2019-01-10 ENCOUNTER — Ambulatory Visit: Payer: 59 | Admitting: Psychiatry

## 2019-02-11 ENCOUNTER — Ambulatory Visit (INDEPENDENT_AMBULATORY_CARE_PROVIDER_SITE_OTHER): Payer: 59 | Admitting: Psychiatry

## 2019-02-11 ENCOUNTER — Other Ambulatory Visit: Payer: Self-pay

## 2019-02-11 ENCOUNTER — Encounter: Payer: Self-pay | Admitting: Psychiatry

## 2019-02-11 DIAGNOSIS — F3341 Major depressive disorder, recurrent, in partial remission: Secondary | ICD-10-CM

## 2019-02-11 DIAGNOSIS — F5081 Binge eating disorder: Secondary | ICD-10-CM | POA: Diagnosis not present

## 2019-02-11 DIAGNOSIS — F50819 Binge eating disorder, unspecified: Secondary | ICD-10-CM | POA: Insufficient documentation

## 2019-02-11 NOTE — Progress Notes (Signed)
Virtual Visit via Video Note  I connected with Barbara Russo on 02/11/19 at  4:00 PM EDT by a video enabled telemedicine application and verified that I am speaking with the correct person using two identifiers.   I discussed the limitations of evaluation and management by telemedicine and the availability of in person appointments. The patient expressed understanding and agreed to proceed.  I discussed the assessment and treatment plan with the patient. The patient was provided an opportunity to ask questions and all were answered. The patient agreed with the plan and demonstrated an understanding of the instructions.   The patient was advised to call back or seek an in-person evaluation if the symptoms worsen or if the condition fails to improve as anticipated.   [place] MD OP Progress Note  02/11/2019 5:14 PM Barbara Russo  MRN:  [id number]  Chief Complaint:  Chief Complaint    Follow-up     HPI: Barbara Russo is a 58 year old Caucasian female, single, lives in [place], has a history of MDD, binge eating disorder, headaches, hyperlipidemia was evaluated by telemedicine today.  Patient reports she is currently doing well with regards to her mood symptoms.  She does report that there are times when she feels lonely and lacks interest in doing things however that is likely due to the current situation as well as being at a new place where she does not have a lot of friends.  She however reports she got a promotion at work and is excited about that.  She has been compliant on her medications as prescribed.  She denies any side effects.  She reports sleep is good.  She denies any suicidality, homicidality or perceptual disturbances.  She denies any other concerns today. Visit Diagnosis:    ICD-10-CM   1. MDD (major depressive disorder), recurrent, in partial remission ([facility])  F33.41   2. Binge eating disorder  F50.81     Past Psychiatric History: I have reviewed past  psychiatric history from my progress note on 01/30/2018.  Past trials of Valium, Depakote, BuSpar, Vyvanse, Wellbutrin, Vraylar  Past Medical History:  Past Medical History:  Diagnosis Date  . Anxiety   . Depression     Past Surgical History:  Procedure Laterality Date  . BREAST BIOPSY Left 2008   neg    Family Psychiatric History: I have reviewed family psychiatric history from my progress note on 01/30/2018. Family History:  Family History  Adopted: Yes    Social History: Have reviewed social history from my progress note on 01/30/2018. Social History   Socioeconomic History  . Marital status: Single    Spouse name: Not on file  . Number of children: 0  . Years of education: Not on file  . Highest education level: Bachelor's degree (e.g., BA, AB, BS)  Occupational History  . Not on file  Social Needs  . Financial resource strain: Not hard at all  . Food insecurity    Worry: Never true    Inability: Never true  . Transportation needs    Medical: No    Non-medical: No  Tobacco Use  . Smoking status: Never Smoker  . Smokeless tobacco: Never Used  Substance and Sexual Activity  . Alcohol use: Yes    Alcohol/week: 2.0 standard drinks    Types: 2 Cans of beer per week  . Drug use: Never  . Sexual activity: Not Currently  Lifestyle  . Physical activity    Days per week: 0 days    Minutes per session:  0 min  . Stress: Not at all  Relationships  . Social Herbalist on phone: More than three times a week    Gets together: Never    Attends religious service: Never    Active member of club or organization: No    Attends meetings of clubs or organizations: Never    Relationship status: Never married  Other Topics Concern  . Not on file  Social History Narrative  . Not on file    Allergies: No Known Allergies  Metabolic Disorder Labs: No results found for: HGBA1C, MPG No results found for: PROLACTIN No results found for: CHOL, TRIG, HDL, CHOLHDL,  VLDL, LDLCALC No results found for: TSH  Therapeutic Level Labs: No results found for: LITHIUM No results found for: VALPROATE No components found for:  CBMZ  Current Medications: Current Outpatient Medications  Medication Sig Dispense Refill  . buPROPion (WELLBUTRIN XL) 300 MG 24 hr tablet Take 1 tablet (300 mg total) by mouth daily. 90 tablet 1  . busPIRone (BUSPAR) 15 MG tablet Take 1 tablet (15 mg total) by mouth 2 (two) times daily. 180 tablet 1  . Butalbital-APAP-Caffeine 50-325-40 MG capsule Take by mouth.    . Calcium Carbonate-Vitamin D (CALCIUM HIGH POTENCY/VITAMIN D) 600-200 MG-UNIT TABS Take 600 mg by mouth.    . cetirizine (ZYRTEC) 10 MG tablet Take by mouth.    . Coenzyme Q10 10 MG capsule Take by mouth.    . divalproex (DEPAKOTE) 250 MG DR tablet Take by mouth.    . gabapentin (NEURONTIN) 300 MG capsule Take 300 mg by mouth 4 (four) times daily.    . simvastatin (ZOCOR) 20 MG tablet Take 20 mg by mouth daily.    Marland Kitchen topiramate (TOPAMAX) 25 MG capsule Take by mouth.     No current facility-administered medications for this visit.      Musculoskeletal: Strength & Muscle Tone: UTA Gait & Station: normal Patient leans: N/A  Psychiatric Specialty Exam: Review of Systems  Psychiatric/Behavioral: Negative for hallucinations and substance abuse. The patient is not nervous/anxious.   All other systems reviewed and are negative.   There were no vitals taken for this visit.There is no height or weight on file to calculate BMI.  General Appearance: Casual  Eye Contact:  Fair  Speech:  Clear and Coherent  Volume:  Normal  Mood:  Euthymic  Affect:  Appropriate  Thought Process:  Goal Directed and Descriptions of Associations: Intact  Orientation:  Full (Time, Place, and Person)  Thought Content: Logical   Suicidal Thoughts:  No  Homicidal Thoughts:  No  Memory:  Immediate;   Fair Recent;   Fair Remote;   Fair  Judgement:  Fair  Insight:  Fair  Psychomotor Activity:   Normal  Concentration:  Concentration: Fair and Attention Span: Fair  Recall:  [employer] of Knowledge: Fair  Language: Fair  Akathisia:  No  Handed:  Right  AIMS (if indicated): Denies tremors, rigidity  Assets:  Communication Skills Desire for Improvement Housing  ADL's:  Intact  Cognition: WNL  Sleep:  Fair   Screenings: PHQ2-9     Office Visit from 02/11/2019 in [facility]  PHQ-2 Total Score  1       Assessment and Plan: Kristanna is a 58 year old Caucasian female, single, lives in [place], has a history of depression, binge eating disorder was evaluated by telemedicine today.  Patient is currently stable on current medication regimen.  Plan MDD in partial  remission PHQ-9 equals 1 Wellbutrin XL 300 mg p.o. daily BuSpar 15 mg p.o. twice daily  Binge eating disorder-stable She is no longer on Vyvanse.  Will monitor closely.  Follow-up in clinic in 2 to 3 months or sooner if needed.  November 30 at 4 PM  I have spent atleast 15 minutes non  face to face with patient today. More than 50 % of the time was spent for psychoeducation and supportive psychotherapy and care coordination. This note was generated in part or whole with voice recognition software. Voice recognition is usually quite accurate but there are transcription errors that can and very often do occur. I apologize for any typographical errors that were not detected and corrected.       Ursula Alert, MD 02/11/2019, 5:14 PM

## 2019-02-21 ENCOUNTER — Other Ambulatory Visit: Payer: Self-pay | Admitting: Psychiatry

## 2019-02-21 DIAGNOSIS — F3341 Major depressive disorder, recurrent, in partial remission: Secondary | ICD-10-CM

## 2019-04-17 ENCOUNTER — Other Ambulatory Visit: Payer: Self-pay | Admitting: Psychiatry

## 2019-04-17 DIAGNOSIS — F3341 Major depressive disorder, recurrent, in partial remission: Secondary | ICD-10-CM

## 2019-04-18 MED ORDER — BUSPIRONE HCL 15 MG PO TABS: 15.0000 mg | ORAL_TABLET | Freq: Two times a day (BID) | ORAL | 1 refills | Status: DC

## 2019-04-18 NOTE — Telephone Encounter (Signed)
Sent buspar to pharmacy 

## 2019-05-06 ENCOUNTER — Other Ambulatory Visit: Payer: Self-pay | Admitting: Neurology

## 2019-05-06 DIAGNOSIS — R221 Localized swelling, mass and lump, neck: Secondary | ICD-10-CM

## 2019-05-12 NOTE — Progress Notes (Signed)
Virtual Visit via Video Note  I connected with Barbara Russo on 05/13/19 at  4:00 PM EST by a video enabled telemedicine application and verified that I am speaking with the correct person using two identifiers.   I discussed the limitations of evaluation and management by telemedicine and the availability of in person appointments. The patient expressed understanding and agreed to proceed.     I discussed the assessment and treatment plan with the patient. The patient was provided an opportunity to ask questions and all were answered. The patient agreed with the plan and demonstrated an understanding of the instructions.   The patient was advised to call back or seek an in-person evaluation if the symptoms worsen or if the condition fails to improve as anticipated.  [facility] MD OP Progress Note  05/13/2019 5:28 PM Barbara Russo  MRN:  [id number]  Chief Complaint:  Chief Complaint    Follow-up     HPI: Barbara Russo is a 58 year old Caucasian female, single, employed, lives in [place], has a history of MDD, binge eating disorder, headaches, hyperlipidemia was evaluated by telemedicine today.  Patient today reports she feels lonely often due to the pandemic and social isolation.  However she has been coping okay.  She has been working from home.  She reports sleep and appetite is good.  She has lost weight and she is excited about that.  She reports mood symptoms are stable.  She is compliant on medications as prescribed.  She denies any suicidality, homicidality or perceptual disturbances.  Patient denies any other concerns today.     Visit Diagnosis:    ICD-10-CM   1. MDD (major depressive disorder), recurrent, in full remission ([place])  F33.42 buPROPion (WELLBUTRIN XL) 300 MG 24 hr tablet  2. Binge eating disorder  F50.81     Past Psychiatric History: I have reviewed past psychiatric history from my progress note on 01/30/2018.  Past trials of Valium, Wellbutrin,  BuSpar, Vraylar, Depakote, Vyvanse.  Past Medical History:  Past Medical History:  Diagnosis Date  . Anxiety   . Depression     Past Surgical History:  Procedure Laterality Date  . BREAST BIOPSY Left 2008   neg    Family Psychiatric History: Reviewed past psychiatric history from my progress note on 01/30/2018.  Family History:  Family History  Adopted: Yes    Social History: Reviewed social history from my progress note on 01/30/2018. Social History   Socioeconomic History  . Marital status: Single    Spouse name: Not on file  . Number of children: 0  . Years of education: Not on file  . Highest education level: Bachelor's degree (e.g., BA, AB, BS)  Occupational History  . Not on file  Social Needs  . Financial resource strain: Not hard at all  . Food insecurity    Worry: Never true    Inability: Never true  . Transportation needs    Medical: No    Non-medical: No  Tobacco Use  . Smoking status: Never Smoker  . Smokeless tobacco: Never Used  Substance and Sexual Activity  . Alcohol use: Yes    Alcohol/week: 2.0 standard drinks    Types: 2 Cans of beer per week  . Drug use: Never  . Sexual activity: Not Currently  Lifestyle  . Physical activity    Days per week: 0 days    Minutes per session: 0 min  . Stress: Not at all  Relationships  . Social Herbalist on phone:  More than three times a week    Gets together: Never    Attends religious service: Never    Active member of club or organization: No    Attends meetings of clubs or organizations: Never    Relationship status: Never married  Other Topics Concern  . Not on file  Social History Narrative  . Not on file    Allergies: No Known Allergies  Metabolic Disorder Labs: No results found for: HGBA1C, MPG No results found for: PROLACTIN No results found for: CHOL, TRIG, HDL, CHOLHDL, VLDL, LDLCALC No results found for: TSH  Therapeutic Level Labs: No results found for: LITHIUM No  results found for: VALPROATE No components found for:  CBMZ  Current Medications: Current Outpatient Medications  Medication Sig Dispense Refill  . Botulinum Toxin Type A (BOTOX) 200 units SOLR INJECT 200 UNITS  INTRAMUSCULARLY EVERY 3  MONTHS (GIVEN AT MD OFFICE, DISCARD UNUSED AFTER 1ST  USE)    . cyclobenzaprine (FLEXERIL) 5 MG tablet Take by mouth.    . Rimegepant Sulfate 75 MG TBDP Take by mouth.    . zolmitriptan (ZOMIG-ZMT) 5 MG disintegrating tablet Take by mouth.    Marland Kitchen buPROPion (WELLBUTRIN XL) 300 MG 24 hr tablet Take 1 tablet (300 mg total) by mouth daily. 90 tablet 1  . busPIRone (BUSPAR) 15 MG tablet Take 1 tablet (15 mg total) by mouth 2 (two) times daily. 180 tablet 1  . Butalbital-APAP-Caffeine 50-325-40 MG capsule Take by mouth.    . Calcium Carbonate-Vitamin D (CALCIUM HIGH POTENCY/VITAMIN D) 600-200 MG-UNIT TABS Take 600 mg by mouth.    . cetirizine (ZYRTEC) 10 MG tablet Take by mouth.    . Coenzyme Q10 10 MG capsule Take by mouth.    . divalproex (DEPAKOTE) 250 MG DR tablet Take by mouth.    . gabapentin (NEURONTIN) 300 MG capsule Take 300 mg by mouth 4 (four) times daily.    . simvastatin (ZOCOR) 20 MG tablet Take 20 mg by mouth daily.    Marland Kitchen topiramate (TOPAMAX) 25 MG capsule Take by mouth.     No current facility-administered medications for this visit.      Musculoskeletal: Strength & Muscle Tone: UTA Gait & Station: normal Patient leans: N/A  Psychiatric Specialty Exam: Review of Systems  Psychiatric/Behavioral: Negative for depression, hallucinations, substance abuse and suicidal ideas. The patient is not nervous/anxious and does not have insomnia.   All other systems reviewed and are negative.   There were no vitals taken for this visit.There is no height or weight on file to calculate BMI.  General Appearance: Casual  Eye Contact:  Fair  Speech:  Clear and Coherent  Volume:  Normal  Mood:  Euthymic  Affect:  Congruent  Thought Process:  Goal Directed  and Descriptions of Associations: Intact  Orientation:  Full (Time, Place, and Person)  Thought Content: Logical   Suicidal Thoughts:  No  Homicidal Thoughts:  No  Memory:  Immediate;   Fair Recent;   Fair Remote;   Fair  Judgement:  Fair  Insight:  Fair  Psychomotor Activity:  Normal  Concentration:  Concentration: Fair and Attention Span: Fair  Recall:  [employer] of Knowledge: Fair  Language: Fair  Akathisia:  No  Handed:  Right  AIMS (if indicated): denies tremors, rigidity  Assets:  Communication Skills Desire for Improvement Housing Social Support  ADL's:  Intact  Cognition: WNL  Sleep:  Fair   Screenings: PHQ2-9     Office Visit from  02/11/2019 in [facility]  PHQ-2 Total Score  1       Assessment and Plan: Ilsa is a 58 year old Caucasian female, single, lives in [place], has a history of depression, binge eating disorder was evaluated by telemedicine today.  Patient is currently doing well with regards to her mood symptoms.  Plan as noted below.  Plan MDD-in remission Wellbutrin XL 300 mg p.o. daily BuSpar 15 mg p.o. twice daily  Binge eating disorder-stable We will continue to monitor closely.  She is not on medications at this time.  Follow-up in clinic in 2- 3 months or sooner if needed.  January 28 at 4:20 PM  I have spent atleast ----- face to face with patient today. More than 50 % of the time was spent for psychoeducation and supportive psychotherapy and care coordination. This note was generated in part or whole with voice recognition software. Voice recognition is usually quite accurate but there are transcription errors that can and very often do occur. I apologize for any typographical errors that were not detected and corrected.        Ursula Alert, MD 05/13/2019, 5:28 PM

## 2019-05-13 ENCOUNTER — Other Ambulatory Visit: Payer: Self-pay

## 2019-05-13 ENCOUNTER — Encounter: Payer: Self-pay | Admitting: Psychiatry

## 2019-05-13 ENCOUNTER — Ambulatory Visit (INDEPENDENT_AMBULATORY_CARE_PROVIDER_SITE_OTHER): Payer: 59 | Admitting: Psychiatry

## 2019-05-13 DIAGNOSIS — F5081 Binge eating disorder: Secondary | ICD-10-CM | POA: Diagnosis not present

## 2019-05-13 DIAGNOSIS — F3342 Major depressive disorder, recurrent, in full remission: Secondary | ICD-10-CM | POA: Diagnosis not present

## 2019-05-13 MED ORDER — BUPROPION HCL ER (XL) 300 MG PO TB24: 300.0000 mg | ORAL_TABLET | Freq: Every day | ORAL | 1 refills | Status: DC

## 2019-05-15 ENCOUNTER — Ambulatory Visit: Payer: 59

## 2019-05-17 ENCOUNTER — Other Ambulatory Visit: Payer: Self-pay

## 2019-05-17 ENCOUNTER — Ambulatory Visit
Admission: RE | Admit: 2019-05-17 | Discharge: 2019-05-17 | Disposition: A | Discharge status: Home / Self Care | Payer: 59 | Source: Ambulatory Visit | Attending: Neurology | Admitting: Neurology

## 2019-05-17 DIAGNOSIS — R221 Localized swelling, mass and lump, neck: Secondary | ICD-10-CM | POA: Insufficient documentation

## 2019-05-22 ENCOUNTER — Other Ambulatory Visit: Payer: Self-pay | Admitting: Physical Medicine and Rehabilitation

## 2019-05-22 DIAGNOSIS — M5030 Other cervical disc degeneration, unspecified cervical region: Secondary | ICD-10-CM

## 2019-05-22 DIAGNOSIS — G8929 Other chronic pain: Secondary | ICD-10-CM

## 2019-05-22 DIAGNOSIS — M4312 Spondylolisthesis, cervical region: Secondary | ICD-10-CM

## 2019-06-01 ENCOUNTER — Other Ambulatory Visit: Payer: Self-pay

## 2019-06-01 ENCOUNTER — Ambulatory Visit
Admission: RE | Admit: 2019-06-01 | Discharge: 2019-06-01 | Disposition: A | Discharge status: Home / Self Care | Payer: 59 | Source: Ambulatory Visit | Attending: Physical Medicine and Rehabilitation | Admitting: Physical Medicine and Rehabilitation

## 2019-06-01 DIAGNOSIS — M4312 Spondylolisthesis, cervical region: Secondary | ICD-10-CM | POA: Insufficient documentation

## 2019-06-01 DIAGNOSIS — M5030 Other cervical disc degeneration, unspecified cervical region: Secondary | ICD-10-CM | POA: Insufficient documentation

## 2019-06-01 DIAGNOSIS — M542 Cervicalgia: Secondary | ICD-10-CM | POA: Insufficient documentation

## 2019-06-01 DIAGNOSIS — G8929 Other chronic pain: Secondary | ICD-10-CM

## 2019-06-06 ENCOUNTER — Other Ambulatory Visit: Payer: Self-pay | Admitting: Physician Assistant

## 2019-06-06 DIAGNOSIS — R221 Localized swelling, mass and lump, neck: Secondary | ICD-10-CM

## 2019-06-18 ENCOUNTER — Ambulatory Visit: Admission: RE | Admit: 2019-06-18 | Payer: Commercial Managed Care - HMO | Source: Ambulatory Visit

## 2019-06-19 ENCOUNTER — Ambulatory Visit
Admission: RE | Admit: 2019-06-19 | Discharge: 2019-06-19 | Disposition: A | Discharge status: Home / Self Care | Payer: 59 | Source: Ambulatory Visit | Attending: Physician Assistant | Admitting: Physician Assistant

## 2019-06-19 ENCOUNTER — Other Ambulatory Visit: Payer: Self-pay

## 2019-06-19 DIAGNOSIS — R221 Localized swelling, mass and lump, neck: Secondary | ICD-10-CM | POA: Diagnosis not present

## 2019-06-19 MED ORDER — IOHEXOL 300 MG/ML  SOLN
75.0000 mL | Freq: Once | INTRAMUSCULAR | Status: AC | PRN
  Administered 2019-06-19: 75 mL via INTRAVENOUS

## 2019-06-20 ENCOUNTER — Ambulatory Visit: Payer: 59

## 2019-07-11 ENCOUNTER — Other Ambulatory Visit: Payer: Self-pay

## 2019-07-11 ENCOUNTER — Ambulatory Visit (INDEPENDENT_AMBULATORY_CARE_PROVIDER_SITE_OTHER): Payer: 59 | Admitting: Psychiatry

## 2019-07-11 ENCOUNTER — Encounter: Payer: Self-pay | Admitting: Psychiatry

## 2019-07-11 DIAGNOSIS — F50819 Binge eating disorder, unspecified: Secondary | ICD-10-CM

## 2019-07-11 DIAGNOSIS — F3342 Major depressive disorder, recurrent, in full remission: Secondary | ICD-10-CM

## 2019-07-11 DIAGNOSIS — F5081 Binge eating disorder: Secondary | ICD-10-CM

## 2019-07-11 NOTE — Progress Notes (Signed)
Provider Location : [facility] Patient Location : Home   Virtual Visit via Video Note  I connected with Barbara Russo on 07/11/19 at  4:20 PM EST by a video enabled telemedicine application and verified that I am speaking with the correct person using two identifiers.   I discussed the limitations of evaluation and management by telemedicine and the availability of in person appointments. The patient expressed understanding and agreed to proceed.     I discussed the assessment and treatment plan with the patient. The patient was provided an opportunity to ask questions and all were answered. The patient agreed with the plan and demonstrated an understanding of the instructions.   The patient was advised to call back or seek an in-person evaluation if the symptoms worsen or if the condition fails to improve as anticipated.   [facility] MD OP Progress Note  07/11/2019 5:10 PM Barbara Russo  MRN:  [id number]  Chief Complaint:  Chief Complaint    Follow-up     HPI: Barbara Russo is a 59 year old Caucasian female, single, employed, lives in [place], has a history of MDD, binge eating disorder, headaches, hyperlipidemia was evaluated by telemedicine today.  Patient today reports she is currently doing well on the current medication regimen.  She denies any depression or anxiety symptoms.  She reports sleep as good.  She reports appetite is fair.  Patient denies any suicidality, homicidality or perceptual disturbances.  She reports she had neck pain as well as headaches however she got injection to her neck and now she feels much better.  She reports work continues to be enjoyable.  She continues to stay safe from the pandemic.  She continues to have good support system from her brother. Visit Diagnosis:    ICD-10-CM   1. MDD (major depressive disorder), recurrent, in full remission ([place])  F33.42   2. Binge eating disorder  F50.81     Past Psychiatric History: I have reviewed  past psychiatric history from my progress note on 01/30/2018.  Past trials of Valium, Wellbutrin, BuSpar, Reglan, Depakote, Vyvanse.   Past Medical History:  Past Medical History:  Diagnosis Date  . Anxiety   . Depression     Past Surgical History:  Procedure Laterality Date  . BREAST BIOPSY Left 2008   neg    Family Psychiatric History: Reviewed family psychiatric history from my progress note on 01/30/2018.  Family History:  Family History  Adopted: Yes    Social History: Reviewed social history from my progress note on 01/30/2018. Social History   Socioeconomic History  . Marital status: Single    Spouse name: Not on file  . Number of children: 0  . Years of education: Not on file  . Highest education level: Bachelor's degree (e.g., BA, AB, BS)  Occupational History  . Not on file  Tobacco Use  . Smoking status: Never Smoker  . Smokeless tobacco: Never Used  Substance and Sexual Activity  . Alcohol use: Yes    Alcohol/week: 2.0 standard drinks    Types: 2 Cans of beer per week  . Drug use: Never  . Sexual activity: Not Currently  Other Topics Concern  . Not on file  Social History Narrative  . Not on file   Social Determinants of Health   Financial Resource Strain:   . Difficulty of Paying Living Expenses: Not on file  Food Insecurity:   . Worried About Charity fundraiser in the Last Year: Not on file  . Ran Out of Food  in the Last Year: Not on file  Transportation Needs:   . Lack of Transportation (Medical): Not on file  . Lack of Transportation (Non-Medical): Not on file  Physical Activity:   . Days of Exercise per Week: Not on file  . Minutes of Exercise per Session: Not on file  Stress:   . Feeling of Stress : Not on file  Social Connections:   . Frequency of Communication with Friends and Family: Not on file  . Frequency of Social Gatherings with Friends and Family: Not on file  . Attends Religious Services: Not on file  . Active Member of Clubs  or Organizations: Not on file  . Attends Archivist Meetings: Not on file  . Marital Status: Not on file    Allergies: No Known Allergies  Metabolic Disorder Labs: No results found for: HGBA1C, MPG No results found for: PROLACTIN No results found for: CHOL, TRIG, HDL, CHOLHDL, VLDL, LDLCALC No results found for: TSH  Therapeutic Level Labs: No results found for: LITHIUM No results found for: VALPROATE No components found for:  CBMZ  Current Medications: Current Outpatient Medications  Medication Sig Dispense Refill  . methocarbamol (ROBAXIN) 500 MG tablet 1/2-1 po qHS prn    . traMADol (ULTRAM) 50 MG tablet Take by mouth.    . Botulinum Toxin Type A (BOTOX) 200 units SOLR INJECT 200 UNITS  INTRAMUSCULARLY EVERY 3  MONTHS (GIVEN AT MD OFFICE, DISCARD UNUSED AFTER 1ST  USE)    . buPROPion (WELLBUTRIN XL) 300 MG 24 hr tablet Take 1 tablet (300 mg total) by mouth daily. 90 tablet 1  . busPIRone (BUSPAR) 15 MG tablet Take 1 tablet (15 mg total) by mouth 2 (two) times daily. 180 tablet 1  . Butalbital-APAP-Caffeine 50-325-40 MG capsule Take by mouth.    . Calcium Carbonate-Vitamin D (CALCIUM HIGH POTENCY/VITAMIN D) 600-200 MG-UNIT TABS Take 600 mg by mouth.    . cetirizine (ZYRTEC) 10 MG tablet Take by mouth.    . Coenzyme Q10 10 MG capsule Take by mouth.    . cyclobenzaprine (FLEXERIL) 5 MG tablet Take by mouth.    . divalproex (DEPAKOTE) 250 MG DR tablet Take by mouth.    . gabapentin (NEURONTIN) 300 MG capsule Take 300 mg by mouth 4 (four) times daily.    Marland Kitchen gabapentin (NEURONTIN) 400 MG capsule Take 400 mg by mouth 3 (three) times daily.    . methocarbamol (ROBAXIN) 500 MG tablet Take 250-500 mg by mouth at bedtime as needed.    . naratriptan (AMERGE) 2.5 MG tablet Take 2.5 mg by mouth daily as needed.    . prochlorperazine (COMPAZINE) 5 MG tablet Take 5 mg by mouth every 6 (six) hours as needed.    . Rimegepant Sulfate 75 MG TBDP Take by mouth.    . simvastatin (ZOCOR)  20 MG tablet Take 20 mg by mouth daily.    Marland Kitchen topiramate (TOPAMAX) 25 MG capsule Take by mouth.    . topiramate (TOPAMAX) 25 MG tablet TAKE 7 TABLETS (175 MG TOTAL) BY MOUTH 2 (TWO) TIMES DAILY    . zolmitriptan (ZOMIG-ZMT) 5 MG disintegrating tablet Take by mouth.     No current facility-administered medications for this visit.     Musculoskeletal: Strength & Muscle Tone: UTA Gait & Station: normal Patient leans: N/A  Psychiatric Specialty Exam: Review of Systems  Psychiatric/Behavioral: Negative for agitation, behavioral problems, confusion, decreased concentration, dysphoric mood, hallucinations, self-injury, sleep disturbance and suicidal ideas. The patient is not nervous/anxious  and is not hyperactive.   All other systems reviewed and are negative.   There were no vitals taken for this visit.There is no height or weight on file to calculate BMI.  General Appearance: Casual  Eye Contact:  Fair  Speech:  Clear and Coherent  Volume:  Normal  Mood:  Euthymic  Affect:  Appropriate  Thought Process:  Goal Directed and Descriptions of Associations: Intact  Orientation:  Full (Time, Place, and Person)  Thought Content: Logical   Suicidal Thoughts:  No  Homicidal Thoughts:  No  Memory:  Immediate;   Fair Recent;   Fair Remote;   Fair  Judgement:  Fair  Insight:  Fair  Psychomotor Activity:  Normal  Concentration:  Concentration: Fair and Attention Span: Fair  Recall:  [employer] of Knowledge: Fair  Language: Fair  Akathisia:  No  Handed:  Right  AIMS (if indicated): denies tremors, rigidity  Assets:  Communication Skills Desire for Improvement Housing Social Support  ADL's:  Intact  Cognition: WNL  Sleep:  Fair   Screenings: PHQ2-9     Office Visit from 02/11/2019 in [facility]  PHQ-2 Total Score  1       Assessment and Plan: Arihana is a 59 year old Caucasian female, single, lives in [place], has a history of depression, binge  eating disorder was evaluated by telemedicine today.  Patient is currently doing well on the current medication regimen.  Plan as noted below.  Plan MDD in remission Wellbutrin XL 300 mg p.o. daily BuSpar 15 mg p.o. twice daily  Binge eating disorder-stable We will continue to monitor closely.  Follow-up in clinic in 3 months or sooner if needed.  April 22 at 4:20 PM  I have spent atleast 20 minutes non face to face with patient today. More than 50 % of the time was spent for preparing ordering medications and test ,psychoeducation and supportive psychotherapy and care coordination,as well as documenting clinical information in electronic health record. This note was generated in part or whole with voice recognition software. Voice recognition is usually quite accurate but there are transcription errors that can and very often do occur. I apologize for any typographical errors that were not detected and corrected.       Ursula Alert, MD 07/11/2019, 5:10 PM

## 2019-07-29 ENCOUNTER — Other Ambulatory Visit: Payer: Self-pay | Admitting: Gastroenterology

## 2019-07-29 DIAGNOSIS — R634 Abnormal weight loss: Secondary | ICD-10-CM

## 2019-08-09 ENCOUNTER — Ambulatory Visit
Admission: RE | Admit: 2019-08-09 | Discharge: 2019-08-09 | Disposition: A | Discharge status: Home / Self Care | Payer: 59 | Source: Ambulatory Visit | Attending: Gastroenterology | Admitting: Gastroenterology

## 2019-08-09 ENCOUNTER — Other Ambulatory Visit: Payer: Self-pay

## 2019-08-09 DIAGNOSIS — R634 Abnormal weight loss: Secondary | ICD-10-CM | POA: Insufficient documentation

## 2019-08-09 MED ORDER — IOHEXOL 300 MG/ML  SOLN
100.0000 mL | Freq: Once | INTRAMUSCULAR | Status: AC | PRN
  Administered 2019-08-09: 100 mL via INTRAVENOUS

## 2019-09-26 ENCOUNTER — Ambulatory Visit: Payer: 59 | Admitting: Dermatology

## 2019-09-26 ENCOUNTER — Other Ambulatory Visit: Payer: Self-pay

## 2019-09-26 ENCOUNTER — Encounter: Payer: Self-pay | Admitting: Dermatology

## 2019-09-26 DIAGNOSIS — L814 Other melanin hyperpigmentation: Secondary | ICD-10-CM | POA: Diagnosis not present

## 2019-09-26 DIAGNOSIS — L578 Other skin changes due to chronic exposure to nonionizing radiation: Secondary | ICD-10-CM | POA: Diagnosis not present

## 2019-09-26 DIAGNOSIS — D692 Other nonthrombocytopenic purpura: Secondary | ICD-10-CM

## 2019-09-26 DIAGNOSIS — D225 Melanocytic nevi of trunk: Secondary | ICD-10-CM | POA: Diagnosis not present

## 2019-09-26 DIAGNOSIS — I781 Nevus, non-neoplastic: Secondary | ICD-10-CM

## 2019-09-26 DIAGNOSIS — D0472 Carcinoma in situ of skin of left lower limb, including hip: Secondary | ICD-10-CM

## 2019-09-26 DIAGNOSIS — L820 Inflamed seborrheic keratosis: Secondary | ICD-10-CM

## 2019-09-26 DIAGNOSIS — D492 Neoplasm of unspecified behavior of bone, soft tissue, and skin: Secondary | ICD-10-CM

## 2019-09-26 DIAGNOSIS — D229 Melanocytic nevi, unspecified: Secondary | ICD-10-CM

## 2019-09-26 DIAGNOSIS — C4492 Squamous cell carcinoma of skin, unspecified: Secondary | ICD-10-CM

## 2019-09-26 DIAGNOSIS — Z1283 Encounter for screening for malignant neoplasm of skin: Secondary | ICD-10-CM | POA: Diagnosis not present

## 2019-09-26 HISTORY — DX: Squamous cell carcinoma of skin, unspecified: C44.92

## 2019-09-26 NOTE — Progress Notes (Signed)
Follow-Up Visit   Subjective  Barbara Russo is a 59 y.o. female who presents for the following: Annual Exam (no hx skin ca) and Skin Tag (L ear, pt not sure how long it has been there, ENT noticed.). Patient presents for skin cancer screening, mole check and total-body skin exam.  The following portions of the chart were reviewed this encounter and updated as appropriate: Tobacco  Allergies  Meds  Problems  Med Hx  Surg Hx  Fam Hx      Review of Systems: No other skin or systemic complaints.  Objective  Well appearing patient in no apparent distress; mood and affect are within normal limits.  A full examination was performed including scalp, head, eyes, ears, nose, lips, neck, chest, axillae, abdomen, back, buttocks, bilateral upper extremities, bilateral lower extremities, hands, feet, fingers, toes, fingernails, and toenails. All findings within normal limits unless otherwise noted below.  Objective  Right pretibial x 1: Erythematous keratotic or waxy stuck-on papule or plaque.   Objective  L prox lat pretibial: Pink flat pap 1.4 x 1.2cm  Objective  L low back paraspinal: 0.4cm black macule  Objective  R pretibial: Varicies and telangiectasias  Assessment & Plan    Inflamed seborrheic keratosis Right pretibial x 1  Destruction of lesion - Right pretibial x 1 Complexity: simple   Destruction method: cryotherapy   Informed consent: discussed and consent obtained   Timeout:  patient name, date of birth, surgical site, and procedure verified Lesion destroyed using liquid nitrogen: Yes   Region frozen until ice ball extended beyond lesion: Yes   Outcome: patient tolerated procedure well with no complications   Post-procedure details: wound care instructions given    Neoplasm of skin (2) L prox lat pretibial  Skin / nail biopsy Type of biopsy: tangential   Informed consent: discussed and consent obtained   Timeout: patient name, date of birth, surgical  site, and procedure verified   Procedure prep:  Patient was prepped and draped in usual sterile fashion Prep type:  Isopropyl alcohol Anesthesia: the lesion was anesthetized in a standard fashion   Anesthetic:  1% lidocaine w/ epinephrine 1-100,000 buffered w/ 8.4% NaHCO3 Instrument used: flexible razor blade   Outcome: patient tolerated procedure well   Post-procedure details: sterile dressing applied and wound care instructions given   Dressing type: bandage and petrolatum    Specimen 1 - Surgical pathology Differential Diagnosis: D48.5 ISK r/o SCC Check Margins: No Pink flat pap 1.4 x 1.2cm  L low back paraspinal  Epidermal / dermal shaving  Lesion length (cm):  0.4 Lesion width (cm):  0.4 Margin per side (cm):  0.2 Total excision diameter (cm):  0.8 Informed consent: discussed and consent obtained   Timeout: patient name, date of birth, surgical site, and procedure verified   Procedure prep:  Patient was prepped and draped in usual sterile fashion Prep type:  Isopropyl alcohol Anesthesia: the lesion was anesthetized in a standard fashion   Anesthetic:  1% lidocaine w/ epinephrine 1-100,000 buffered w/ 8.4% NaHCO3 Instrument used: flexible razor blade   Hemostasis achieved with: pressure, aluminum chloride and electrodesiccation   Outcome: patient tolerated procedure well   Post-procedure details: sterile dressing applied and wound care instructions given   Dressing type: bandage and petrolatum    Specimen 2 - Surgical pathology Differential Diagnosis: D48.5 Nevus vs Dysplastic nevus Check Margins: yes 0.4cm black macule  Telangiectasia R pretibial  Benign, observe.    Actinic Damage - diffuse scaly erythematous macules with  underlying dyspigmentation - Recommend daily broad spectrum sunscreen SPF 30+ to sun-exposed areas, reapply every 2 hours as needed.  - Call for new or changing lesions.  Lentigines - Scattered tan macules - Discussed due to sun exposure -  Benign, observe - Call for any changes  L ear clear to exam externally and ear concha.  Purpura - Violaceous macules and patches - Benign - Related to age, sun damage and/or use of blood thinners - Observe - Can use OTC arnica containing moisturizer such as Dermend Bruise Formula if desired - Call for worsening or other concerns  Melanocytic Nevi  - Numerous small dark macules - Tan-brown and/or pink-flesh-colored symmetric macules and papules - Benign appearing on exam today - Observation - Call clinic for new or changing moles - Recommend daily use of broad spectrum spf 30+ sunscreen to sun-exposed areas.     Return in about 6 months (around 03/27/2020) for mole check.   I, Othelia Pulling, RMA, am acting as scribe for Sarina Ser, MD . Documentation: I have reviewed the above documentation for accuracy and completeness, and I agree with the above.  Sarina Ser, MD

## 2019-09-26 NOTE — Patient Instructions (Signed)

## 2019-09-30 ENCOUNTER — Telehealth: Payer: Self-pay

## 2019-09-30 NOTE — Telephone Encounter (Signed)
Left message for patient to call office for rersults.

## 2019-09-30 NOTE — Telephone Encounter (Signed)
-----   Message from Ralene Bathe, MD sent at 09/30/2019  9:38 AM EDT ----- 1. Skin , left prox lat pretibial SQUAMOUS CELL CARCINOMA IN SITU 2. Skin , left lower back paraspinal DYSPLASTIC NEVUS WITH MODERATE TO SEVERE ATYPIA,  1- Cancer - SCC in situ Superficial Sched for EDC 2- Dysplastic Moderate to severe Sched for shave removal

## 2019-10-01 ENCOUNTER — Telehealth: Payer: Self-pay

## 2019-10-01 NOTE — Telephone Encounter (Signed)
Discussed biopsy results with pt  °

## 2019-10-03 ENCOUNTER — Telehealth (INDEPENDENT_AMBULATORY_CARE_PROVIDER_SITE_OTHER): Payer: 59 | Admitting: Psychiatry

## 2019-10-03 ENCOUNTER — Ambulatory Visit (INDEPENDENT_AMBULATORY_CARE_PROVIDER_SITE_OTHER): Payer: 59 | Admitting: Dermatology

## 2019-10-03 ENCOUNTER — Other Ambulatory Visit: Payer: Self-pay

## 2019-10-03 ENCOUNTER — Encounter: Payer: Self-pay | Admitting: Psychiatry

## 2019-10-03 DIAGNOSIS — F3342 Major depressive disorder, recurrent, in full remission: Secondary | ICD-10-CM

## 2019-10-03 DIAGNOSIS — F5081 Binge eating disorder: Secondary | ICD-10-CM | POA: Diagnosis not present

## 2019-10-03 DIAGNOSIS — C44729 Squamous cell carcinoma of skin of left lower limb, including hip: Secondary | ICD-10-CM

## 2019-10-03 DIAGNOSIS — D239 Other benign neoplasm of skin, unspecified: Secondary | ICD-10-CM

## 2019-10-03 DIAGNOSIS — D235 Other benign neoplasm of skin of trunk: Secondary | ICD-10-CM | POA: Diagnosis not present

## 2019-10-03 DIAGNOSIS — D229 Melanocytic nevi, unspecified: Secondary | ICD-10-CM

## 2019-10-03 DIAGNOSIS — D225 Melanocytic nevi of trunk: Secondary | ICD-10-CM | POA: Diagnosis not present

## 2019-10-03 DIAGNOSIS — C4492 Squamous cell carcinoma of skin, unspecified: Secondary | ICD-10-CM

## 2019-10-03 HISTORY — DX: Other benign neoplasm of skin, unspecified: D23.9

## 2019-10-03 MED ORDER — BUPROPION HCL ER (XL) 300 MG PO TB24: 300.0000 mg | ORAL_TABLET | Freq: Every day | ORAL | 1 refills | Status: DC

## 2019-10-03 MED ORDER — BUSPIRONE HCL 15 MG PO TABS: 15.0000 mg | ORAL_TABLET | Freq: Two times a day (BID) | ORAL | 1 refills | Status: DC

## 2019-10-03 NOTE — Progress Notes (Signed)
   Follow-Up Visit   Subjective  Barbara Russo is a 59 y.o. female who presents for the following: check biopsy  sites (Pt here to have biopsy sites checked, pt c/o red and drainage at biopsy sites) and Biopsy proven Dysplastic nevus (pt here to have Dysplastic nevus re-excised ).   The following portions of the chart were reviewed this encounter and updated as appropriate:  Tobacco  Allergies  Meds  Problems  Med Hx  Surg Hx  Fam Hx      Review of Systems:  No other skin or systemic complaints except as noted in HPI or Assessment and Plan.  Objective  Well appearing patient in no apparent distress; mood and affect are within normal limits.  A focused examination was performed including back, Left pretibial . Relevant physical exam findings are noted in the Assessment and Plan.  Objective  Left lower back paraspinal: 0.4 cm Healing pink biopsy site.   Objective  Back: Tan-brown and/or pink-flesh-colored symmetric macules and papules.   Objective  Left proximal lateral pretibial: Well healing patch, no sign of infection    Assessment & Plan  Dysplastic nevus Left lower back paraspinal  Biopsy proven moderate to severe dysplastic nevus. Discussed treatment.  Recommend shave removal today.  Epidermal / dermal shaving - Left lower back paraspinal  Lesion length (cm):  0.4 Lesion width (cm):  0.4 Margin per side (cm):  0.4 Total excision diameter (cm):  1.2 Informed consent: discussed and consent obtained   Timeout: patient name, date of birth, surgical site, and procedure verified   Procedure prep:  Patient was prepped and draped in usual sterile fashion Prep type:  Isopropyl alcohol Anesthesia: the lesion was anesthetized in a standard fashion   Anesthetic:  1% lidocaine w/ epinephrine 1-100,000 buffered w/ 8.4% NaHCO3 Hemostasis achieved with: pressure, aluminum chloride and electrodesiccation   Outcome: patient tolerated procedure well   Post-procedure  details: sterile dressing applied and wound care instructions given   Dressing type: bandage and petrolatum    Specimen 1 - Surgical pathology Differential Diagnosis: Residual Dysplastic nevus  Check Margins: No Healing pink biopsy site.  Nevus Back  Nevus vs Atypia  Recheck back at next visit   Squamous cell carcinoma of skin Left proximal lateral pretibial  Biopsy results discussed  Return to the office for [facility]   Return for [facility] . As scheduled.  IMarye Round, CMA, am acting as scribe for Sarina Ser, MD .  Documentation: I have reviewed the above documentation for accuracy and completeness, and I agree with the above.  Sarina Ser, MD

## 2019-10-03 NOTE — Progress Notes (Signed)
Provider Location : [facility] Patient Location : Home  Virtual Visit via Video Note  I connected with Barbara Russo on 10/03/19 at  4:20 PM EDT by a video enabled telemedicine application and verified that I am speaking with the correct person using two identifiers.   I discussed the limitations of evaluation and management by telemedicine and the availability of in person appointments. The patient expressed understanding and agreed to proceed.    I discussed the assessment and treatment plan with the patient. The patient was provided an opportunity to ask questions and all were answered. The patient agreed with the plan and demonstrated an understanding of the instructions.   The patient was advised to call back or seek an in-person evaluation if the symptoms worsen or if the condition fails to improve as anticipated.   [place] MD OP Progress Note  10/03/2019 5:26 PM Barbara Russo  MRN:  [id number]  Chief Complaint:  Chief Complaint    Follow-up     HPI: Barbara Russo is a 59 year old Caucasian female, single, employed, lives in [place], has a history of MDD, binge eating disorder, headaches, hyperlipidemia was evaluated by telemedicine today..  Patient reports she is currently stable on current medication regimen.  She denies any sadness, anxiety or crying spells.  She reports sleep and appetite as fair.  She denies side effects to medications.  She was able to get an injection to her neck and she reports her headaches have improved.  She reports work continues to be busy.  She reports she had conflict with her colleagues recently however she was able to cope with it well and it does not bother her much at this time.  Patient denies any suicidality, homicidality or perceptual disturbances.  She denies any other concerns today.   Visit Diagnosis:    ICD-10-CM   1. MDD (major depressive disorder), recurrent, in full remission ([place])  F33.42 buPROPion (WELLBUTRIN XL) 300 MG 24  hr tablet  2. Binge eating disorder  F50.81 busPIRone (BUSPAR) 15 MG tablet    Past Psychiatric History: I have reviewed past psychiatric history from my progress note on 01/30/2018.  Past trials of Valium, Wellbutrin, BuSpar, Reglan, Depakote, Vyvanse  Past Medical History:  Past Medical History:  Diagnosis Date  . Anxiety   . Depression     Past Surgical History:  Procedure Laterality Date  . BREAST BIOPSY Left 2008   neg    Family Psychiatric History: I have reviewed family psychiatric history from my progress note on 01/30/2018.  Family History:  Family History  Adopted: Yes    Social History: I have reviewed social history from my progress note on 01/30/2018. Social History   Socioeconomic History  . Marital status: Single    Spouse name: Not on file  . Number of children: 0  . Years of education: Not on file  . Highest education level: Bachelor's degree (e.g., BA, AB, BS)  Occupational History  . Not on file  Tobacco Use  . Smoking status: Never Smoker  . Smokeless tobacco: Never Used  Substance and Sexual Activity  . Alcohol use: Yes    Alcohol/week: 2.0 standard drinks    Types: 2 Cans of beer per week  . Drug use: Never  . Sexual activity: Not Currently  Other Topics Concern  . Not on file  Social History Narrative  . Not on file   Social Determinants of Health   Financial Resource Strain:   . Difficulty of Paying Living Expenses:   Food  Insecurity:   . Worried About Charity fundraiser in the Last Year:   . Arboriculturist in the Last Year:   Transportation Needs:   . Film/video editor (Medical):   Marland Kitchen Lack of Transportation (Non-Medical):   Physical Activity:   . Days of Exercise per Week:   . Minutes of Exercise per Session:   Stress:   . Feeling of Stress :   Social Connections:   . Frequency of Communication with Friends and Family:   . Frequency of Social Gatherings with Friends and Family:   . Attends Religious Services:   . Active  Member of Clubs or Organizations:   . Attends Archivist Meetings:   Marland Kitchen Marital Status:     Allergies: No Known Allergies  Metabolic Disorder Labs: No results found for: HGBA1C, MPG No results found for: PROLACTIN No results found for: CHOL, TRIG, HDL, CHOLHDL, VLDL, LDLCALC No results found for: TSH  Therapeutic Level Labs: No results found for: LITHIUM No results found for: VALPROATE No components found for:  CBMZ  Current Medications: Current Outpatient Medications  Medication Sig Dispense Refill  . buPROPion (WELLBUTRIN XL) 300 MG 24 hr tablet Take 1 tablet (300 mg total) by mouth daily. 90 tablet 1  . busPIRone (BUSPAR) 15 MG tablet Take 1 tablet (15 mg total) by mouth 2 (two) times daily. 180 tablet 1  . Butalbital-APAP-Caffeine 50-325-40 MG capsule Take by mouth.    . Calcium Carbonate-Vitamin D (CALCIUM HIGH POTENCY/VITAMIN D) 600-200 MG-UNIT TABS Take 600 mg by mouth.    . cetirizine (ZYRTEC) 10 MG tablet Take by mouth.    . Coenzyme Q10 10 MG capsule Take by mouth.    . [employer] PURELAX 17 GM/SCOOP powder TAKE AS DIRECTED FOR COLONIC PREP.    Marland Kitchen divalproex (DEPAKOTE) 250 MG DR tablet Take by mouth.    . gabapentin (NEURONTIN) 300 MG capsule Take 300 mg by mouth 4 (four) times daily.    Marland Kitchen gabapentin (NEURONTIN) 400 MG capsule Take 400 mg by mouth 3 (three) times daily.    . naratriptan (AMERGE) 2.5 MG tablet Take 2.5 mg by mouth daily as needed.    . polyethylene glycol powder (GLYCOLAX/MIRALAX) 17 GM/SCOOP powder Take as directed for colonic prep.    . prochlorperazine (COMPAZINE) 5 MG tablet Take 5 mg by mouth every 6 (six) hours as needed.    . Rimegepant Sulfate 75 MG TBDP Take by mouth.    . simvastatin (ZOCOR) 20 MG tablet Take 20 mg by mouth daily.    Marland Kitchen topiramate (TOPAMAX) 25 MG capsule Take by mouth.    . topiramate (TOPAMAX) 25 MG tablet TAKE 7 TABLETS (175 MG TOTAL) BY MOUTH 2 (TWO) TIMES DAILY    . traMADol (ULTRAM) 50 MG tablet Take by mouth.    .  zolmitriptan (ZOMIG-ZMT) 5 MG disintegrating tablet Take by mouth.     No current facility-administered medications for this visit.     Musculoskeletal: Strength & Muscle Tone: UTA Gait & Station: normal Patient leans: N/A  Psychiatric Specialty Exam: Review of Systems  Psychiatric/Behavioral: Negative for agitation, behavioral problems, confusion, decreased concentration, dysphoric mood, hallucinations, self-injury, sleep disturbance and suicidal ideas. The patient is not nervous/anxious and is not hyperactive.   All other systems reviewed and are negative.   There were no vitals taken for this visit.There is no height or weight on file to calculate BMI.  General Appearance: Casual  Eye Contact:  Good  Speech:  Clear and Coherent  Volume:  Normal  Mood:  Euthymic  Affect:  Congruent  Thought Process:  Goal Directed and Descriptions of Associations: Intact  Orientation:  Full (Time, Place, and Person)  Thought Content: Logical   Suicidal Thoughts:  No  Homicidal Thoughts:  No  Memory:  Immediate;   Fair Recent;   Fair Remote;   Fair  Judgement:  Fair  Insight:  Fair  Psychomotor Activity:  Normal  Concentration:  Concentration: Fair and Attention Span: Fair  Recall:  [employer] of Knowledge: Fair  Language: Fair  Akathisia:  No  Handed:  Right  AIMS (if indicated): UTA  Assets:  Communication Skills Desire for Improvement Social Support  ADL's:  Intact  Cognition: WNL  Sleep:  Fair   Screenings: PHQ2-9     Office Visit from 02/11/2019 in [place]  PHQ-2 Total Score  1       Assessment and Plan: Aliyanna is a 59 year old Caucasian female, single, lives in [place], has a history of depression, binge eating disorder was evaluated by telemedicine today.  Patient is currently stable on current medication regimen.  Plan as noted below.  Plan MDD in remission Wellbutrin XL 300 mg p.o. daily BuSpar 15 mg p.o. twice daily  Binge  eating disorder-stable We will monitor closely  Follow-up in clinic in 3 months or sooner if needed.  I have spent atleast 20 minutes non  face to face with patient today. More than 50 % of the time was spent for preparing to see the patient ( e.g., review of test, records ),  ordering medications and test ,psychoeducation and supportive psychotherapy and care coordination,as well as documenting clinical information in electronic health record. This note was generated in part or whole with voice recognition software. Voice recognition is usually quite accurate but there are transcription errors that can and very often do occur. I apologize for any typographical errors that were not detected and corrected.      Ursula Alert, MD 10/03/2019, 5:26 PM

## 2019-10-03 NOTE — Patient Instructions (Signed)

## 2019-10-04 ENCOUNTER — Encounter: Payer: Self-pay | Admitting: Dermatology

## 2019-10-07 ENCOUNTER — Telehealth: Payer: Self-pay

## 2019-10-07 NOTE — Telephone Encounter (Signed)
LM on VM to return my call. 

## 2019-10-08 ENCOUNTER — Telehealth: Payer: Self-pay

## 2019-10-08 NOTE — Telephone Encounter (Signed)
Discussed biopsy results with pt  °

## 2019-12-30 ENCOUNTER — Ambulatory Visit: Payer: 59 | Admitting: Dermatology

## 2020-01-06 ENCOUNTER — Other Ambulatory Visit: Payer: Self-pay

## 2020-01-06 ENCOUNTER — Telehealth (INDEPENDENT_AMBULATORY_CARE_PROVIDER_SITE_OTHER): Payer: 59 | Admitting: Psychiatry

## 2020-01-06 ENCOUNTER — Encounter: Payer: Self-pay | Admitting: Psychiatry

## 2020-01-06 DIAGNOSIS — F3342 Major depressive disorder, recurrent, in full remission: Secondary | ICD-10-CM | POA: Diagnosis not present

## 2020-01-06 DIAGNOSIS — F5081 Binge eating disorder: Secondary | ICD-10-CM

## 2020-01-06 NOTE — Progress Notes (Signed)
Provider Location : [facility] Patient Location : Home  Virtual Visit via Video Note  I connected with Barbara Russo on 01/06/20 at  4:40 PM EDT by a video enabled telemedicine application and verified that I am speaking with the correct person using two identifiers.   I discussed the limitations of evaluation and management by telemedicine and the availability of in person appointments. The patient expressed understanding and agreed to proceed.   I discussed the assessment and treatment plan with the patient. The patient was provided an opportunity to ask questions and all were answered. The patient agreed with the plan and demonstrated an understanding of the instructions.   The patient was advised to call back or seek an in-person evaluation if the symptoms worsen or if the condition fails to improve as anticipated.   [place] MD OP Progress Note  01/06/2020 9:11 PM Barbara Russo  MRN:  [id number]  Chief Complaint:  Chief Complaint    Follow-up     HPI: Barbara Russo is a 59 year old Caucasian female, single, employed, lives in [place], has a history of MDD, binge eating disorder, headaches, hyperlipidemia was evaluated by telemedicine today.  Patient today reports she is currently doing well.  She however reports that few weeks ago she had an episode of feeling sad for a couple of days.  She reports she had a lot going on at that time.  She reports she had to work from home for a few days as well as her brother who lives next door has a problem with alcohol which kind of affected her mood.  She however reports she was able to cope with it and it did not last too long.  She reports she is compliant on medications and notes that she needs these medications.  She denies side effects.  She reports sleep as good.  Patient denies any suicidality, homicidality or perceptual disturbances.  She reports she currently does not have any binging on food.  She reports appetite as  fair.  She reports work as going well and she is able to function at work.  Patient denies any other concerns today.  Visit Diagnosis:    ICD-10-CM   1. MDD (major depressive disorder), recurrent, in full remission ([place])  F33.42   2. Binge eating disorder  F50.81    in remission    Past Psychiatric History: I have reviewed past psychiatric history from my progress note on 01/30/2018.  Past trials of Valium, Wellbutrin, BuSpar, Ritalin, Depakote, Vyvanse.  Past Medical History:  Past Medical History:  Diagnosis Date  . Anxiety   . Depression   . Dysplastic nevus 10/03/2019   L lower back paraspinal mod to severe (shave removal)  . Squamous cell carcinoma of skin 09/26/2019   SCCIS - L prox lat pretibial     Past Surgical History:  Procedure Laterality Date  . BREAST BIOPSY Left 2008   neg    Family Psychiatric History: I have reviewed family psychiatric history from my progress note on 01/30/2018  Family History:  Family History  Adopted: Yes    Social History: Reviewed social history from my progress note on 01/30/2018 Social History   Socioeconomic History  . Marital status: Single    Spouse name: Not on file  . Number of children: 0  . Years of education: Not on file  . Highest education level: Bachelor's degree (e.g., BA, AB, BS)  Occupational History  . Not on file  Tobacco Use  . Smoking status: Never Smoker  .  Smokeless tobacco: Never Used  Vaping Use  . Vaping Use: Never used  Substance and Sexual Activity  . Alcohol use: Yes    Alcohol/week: 2.0 standard drinks    Types: 2 Cans of beer per week  . Drug use: Never  . Sexual activity: Not Currently  Other Topics Concern  . Not on file  Social History Narrative  . Not on file   Social Determinants of Health   Financial Resource Strain:   . Difficulty of Paying Living Expenses:   Food Insecurity:   . Worried About Charity fundraiser in the Last Year:   . Arboriculturist in the Last Year:    Transportation Needs:   . Film/video editor (Medical):   Marland Kitchen Lack of Transportation (Non-Medical):   Physical Activity:   . Days of Exercise per Week:   . Minutes of Exercise per Session:   Stress:   . Feeling of Stress :   Social Connections:   . Frequency of Communication with Friends and Family:   . Frequency of Social Gatherings with Friends and Family:   . Attends Religious Services:   . Active Member of Clubs or Organizations:   . Attends Archivist Meetings:   Marland Kitchen Marital Status:     Allergies: No Known Allergies  Metabolic Disorder Labs: No results found for: HGBA1C, MPG No results found for: PROLACTIN No results found for: CHOL, TRIG, HDL, CHOLHDL, VLDL, LDLCALC No results found for: TSH  Therapeutic Level Labs: No results found for: LITHIUM No results found for: VALPROATE No components found for:  CBMZ  Current Medications: Current Outpatient Medications  Medication Sig Dispense Refill  . topiramate (TOPAMAX) 100 MG tablet Take by mouth.    Marland Kitchen buPROPion (WELLBUTRIN XL) 300 MG 24 hr tablet Take 1 tablet (300 mg total) by mouth daily. 90 tablet 1  . busPIRone (BUSPAR) 15 MG tablet Take 1 tablet (15 mg total) by mouth 2 (two) times daily. 180 tablet 1  . Butalbital-APAP-Caffeine 50-325-40 MG capsule Take by mouth.    . Calcium Carbonate-Vitamin D (CALCIUM HIGH POTENCY/VITAMIN D) 600-200 MG-UNIT TABS Take 600 mg by mouth.    . cetirizine (ZYRTEC) 10 MG tablet Take by mouth.    . Coenzyme Q10 10 MG capsule Take by mouth.    . [employer]S PURELAX 17 GM/SCOOP powder TAKE AS DIRECTED FOR COLONIC PREP.    Marland Kitchen divalproex (DEPAKOTE) 250 MG DR tablet Take by mouth.    . gabapentin (NEURONTIN) 400 MG capsule Take 400 mg by mouth 3 (three) times daily.    . naratriptan (AMERGE) 2.5 MG tablet Take 2.5 mg by mouth daily as needed.    . polyethylene glycol powder (GLYCOLAX/MIRALAX) 17 GM/SCOOP powder Take as directed for colonic prep.    . prochlorperazine (COMPAZINE) 5 MG  tablet Take 5 mg by mouth every 6 (six) hours as needed.    . Rimegepant Sulfate 75 MG TBDP Take by mouth.    . simvastatin (ZOCOR) 20 MG tablet Take 20 mg by mouth daily.    . traMADol (ULTRAM) 50 MG tablet Take by mouth.    . zolmitriptan (ZOMIG-ZMT) 5 MG disintegrating tablet Take by mouth.     No current facility-administered medications for this visit.     Musculoskeletal: Strength & Muscle Tone: UTA Gait & Station: normal Patient leans: N/A  Psychiatric Specialty Exam: Review of Systems  Psychiatric/Behavioral: Negative for agitation, behavioral problems, confusion, decreased concentration, dysphoric mood, hallucinations, self-injury, sleep disturbance and  suicidal ideas. The patient is not nervous/anxious and is not hyperactive.   All other systems reviewed and are negative.   There were no vitals taken for this visit.There is no height or weight on file to calculate BMI.  General Appearance: Casual  Eye Contact:  Fair  Speech:  Clear and Coherent  Volume:  Normal  Mood:  Euthymic  Affect:  Congruent  Thought Process:  Goal Directed and Descriptions of Associations: Intact  Orientation:  Full (Time, Place, and Person)  Thought Content: Logical   Suicidal Thoughts:  No  Homicidal Thoughts:  No  Memory:  Immediate;   Fair Recent;   Fair Remote;   Fair  Judgement:  Fair  Insight:  Fair  Psychomotor Activity:  Normal  Concentration:  Concentration: Fair and Attention Span: Fair  Recall:  [employer] of Knowledge: Fair  Language: Fair  Akathisia:  No  Handed:  Right  AIMS (if indicated): UTA  Assets:  Communication Skills Desire for Improvement Housing Social Support  ADL's:  Intact  Cognition: WNL  Sleep:  Fair   Screenings: PHQ2-9     Office Visit from 02/11/2019 in [facility]  PHQ-2 Total Score 1       Assessment and Plan: Damika Harmon is a 59 year old Caucasian female, single, lives in [place], has a history of  depression, binge eating disorder was evaluated by telemedicine today.  Patient is currently doing well on the current medication regimen.  Plan as noted below.  Plan MDD in remission Wellbutrin XL 300 mg p.o. daily BuSpar 15 mg p.o. twice daily  Binge eating disorder in remission We will monitor closely  Follow-up in clinic in 2 months or sooner if needed.  I have spent atleast 20 minutes non face to face with patient today. More than 50 % of the time was spent for  ordering medications and test ,psychoeducation and supportive psychotherapy and care coordination,as well as documenting clinical information in electronic health record. This note was generated in part or whole with voice recognition software. Voice recognition is usually quite accurate but there are transcription errors that can and very often do occur. I apologize for any typographical errors that were not detected and corrected.        Ursula Alert, MD 01/06/2020, 9:11 PM

## 2020-01-07 ENCOUNTER — Other Ambulatory Visit: Payer: Self-pay

## 2020-01-07 ENCOUNTER — Ambulatory Visit: Payer: 59 | Admitting: Dermatology

## 2020-01-07 ENCOUNTER — Encounter: Payer: Self-pay | Admitting: Dermatology

## 2020-01-07 DIAGNOSIS — D0472 Carcinoma in situ of skin of left lower limb, including hip: Secondary | ICD-10-CM

## 2020-01-07 DIAGNOSIS — D485 Neoplasm of uncertain behavior of skin: Secondary | ICD-10-CM | POA: Diagnosis not present

## 2020-01-07 DIAGNOSIS — Z86018 Personal history of other benign neoplasm: Secondary | ICD-10-CM

## 2020-01-07 DIAGNOSIS — L578 Other skin changes due to chronic exposure to nonionizing radiation: Secondary | ICD-10-CM | POA: Diagnosis not present

## 2020-01-07 DIAGNOSIS — D099 Carcinoma in situ, unspecified: Secondary | ICD-10-CM

## 2020-01-07 DIAGNOSIS — D489 Neoplasm of uncertain behavior, unspecified: Secondary | ICD-10-CM

## 2020-01-07 NOTE — Progress Notes (Signed)
Follow-Up Visit   Subjective  Barbara Russo is a 59 y.o. female who presents for the following: Follow-up.  Patient presents today for follow up on OV 10/03/19. Pt presents today for ED & C Sccis Left prox. lat pretibial and Check Mod to severe dysplastic Left lower paraspinal. Patient also here for evaluation of area of concern on left side near breast.  The following portions of the chart were reviewed this encounter and updated as appropriate:  Tobacco   Allergies   Meds   Problems   Med Hx   Surg Hx   Fam Hx      Review of Systems:  No other skin or systemic complaints except as noted in HPI or Assessment and Plan.  Objective  Well appearing patient in no apparent distress; mood and affect are within normal limits.  A focused examination was performed including Back, left pretibia, left side. Relevant physical exam findings are noted in the Assessment and Plan.  Objective  Left Lateral Breast: 1.1 cm irregular brown papule  Objective  Left Lower Paraspinal: Scar with no evidence of recurrence.   Objective  Left Proximal Pretibial: Keratotic pink papule/nodule or plaque.    Assessment & Plan    Neoplasm of uncertain behavior Left Lateral Breast  Epidermal / dermal shaving  Lesion diameter (cm):  1.1 Informed consent: discussed and consent obtained   Timeout: patient name, date of birth, surgical site, and procedure verified   Procedure prep:  Patient was prepped and draped in usual sterile fashion Prep type:  Isopropyl alcohol Anesthesia: the lesion was anesthetized in a standard fashion   Anesthetic:  1% lidocaine w/ epinephrine 1-100,000 buffered w/ 8.4% NaHCO3 Instrument used: flexible razor blade   Hemostasis achieved with: pressure, aluminum chloride and electrodesiccation   Outcome: patient tolerated procedure well   Post-procedure details: sterile dressing applied and wound care instructions given   Dressing type: bandage and petrolatum    Specimen  1 - Surgical pathology Differential Diagnosis: isk vs nevus vs dysplastic nevus Check Margins: No 1.1 cm irregular brown papule  Shave removal today  History of dysplastic nevus Left Lower Paraspinal Clear. Observe for recurrence. Call clinic for new or changing lesions.  Recommend regular skin exams, daily broad-spectrum spf 30+ sunscreen use, and photoprotection.    Squamous cell carcinoma in situ (SCCIS) -biopsy-proven Left Proximal Pretibial  Destruction of lesion Complexity: simple   Informed consent: discussed and consent obtained   Timeout:  patient name, date of birth, surgical site, and procedure verified Patient was prepped and draped in usual sterile fashion: area prepped with Isopropyl Alcohol. Anesthesia: the lesion was anesthetized in a standard fashion   Anesthetic:  1% lidocaine w/ epinephrine 1-100,000 buffered w/ 8.4% NaHCO3 Lesion length (cm):  1.7 Lesion width (cm):  1.7 Margin per side (cm):  0.2 Final wound size (cm):  2.1 Hemostasis achieved with:  pressure, aluminum chloride and electrodesiccation Outcome: patient tolerated procedure well with no complications   Post-procedure details: wound care instructions given   Post-procedure details comment:  Ointment and bandage applied  ED & C today  Actinic Damage - diffuse scaly erythematous macules with underlying dyspigmentation - Recommend daily broad spectrum sunscreen SPF 30+ to sun-exposed areas, reapply every 2 hours as needed.  - Call for new or changing lesions.  Return for As Scheduled.  Marene Lenz, CMA, am acting as scribe for Sarina Ser, MD . Documentation: I have reviewed the above documentation for accuracy and completeness, and I agree with  the above.  Sarina Ser, MD

## 2020-01-07 NOTE — Patient Instructions (Addendum)
Recommend daily broad spectrum sunscreen SPF 30+ to sun-exposed areas, reapply every 2 hours as needed. Call for new or changing lesions.  Wound Care Instructions  1. Cleanse wound gently with soap and water once a day then pat dry with clean gauze. Apply a thing coat of Petrolatum (petroleum jelly, "Vaseline") over the wound (unless you have an allergy to this). We recommend that you use a new, sterile tube of Vaseline. Do not pick or remove scabs. Do not remove the yellow or white "healing tissue" from the base of the wound.  2. Cover the wound with fresh, clean, nonstick gauze and secure with paper tape. You may use Band-Aids in place of gauze and tape if the would is small enough, but would recommend trimming much of the tape off as there is often too much. Sometimes Band-Aids can irritate the skin.  3. You should call the office for your biopsy report after 1 week if you have not already been contacted.  4. If you experience any problems, such as abnormal amounts of bleeding, swelling, significant bruising, significant pain, or evidence of infection, please call the office immediately.  5. FOR ADULT SURGERY PATIENTS: If you need something for pain relief you may take 1 extra strength Tylenol (acetaminophen) AND 2 Ibuprofen (200mg each) together every 4 hours as needed for pain. (do not take these if you are allergic to them or if you have a reason you should not take them.) Typically, you may only need pain medication for 1 to 3 days.    Electrodesiccation and Curettage ("Scrape and Burn") Wound Care Instructions  6. Leave the original bandage on for 24 hours if possible.  If the bandage becomes soaked or soiled before that time, it is OK to remove it and examine the wound.  A small amount of post-operative bleeding is normal.  If excessive bleeding occurs, remove the bandage, place gauze over the site and apply continuous pressure (no peeking) over the area for 30 minutes. If this does not  work, please call our clinic as soon as possible or page your doctor if it is after hours.   7. Once a day, cleanse the wound with soap and water. It is fine to shower. If a thick crust develops you may use a Q-tip dipped into dilute hydrogen peroxide (mix 1:1 with water) to dissolve it.  Hydrogen peroxide can slow the healing process, so use it only as needed.    8. After washing, apply petroleum jelly (Vaseline) or an antibiotic ointment if your doctor prescribed one for you, followed by a bandage.    9. For best healing, the wound should be covered with a layer of ointment at all times. If you are not able to keep the area covered with a bandage to hold the ointment in place, this may mean re-applying the ointment several times a day.  Continue this wound care until the wound has healed and is no longer open. It may take several weeks for the wound to heal and close.  Itching and mild discomfort is normal during the healing process.  If you have any discomfort, you can take Tylenol (acetaminophen) or ibuprofen as directed on the bottle. (Please do not take these if you have an allergy to them or cannot take them for another reason).  Some redness, tenderness and white or yellow material in the wound is normal healing.  If the area becomes very sore and red, or develops a thick yellow-green material (pus), it may   infected; please notify us.    Wound healing continues for up to one year following surgery. It is not unusual to experience pain in the scar from time to time during the interval.  If the pain becomes severe or the scar thickens, you should notify the office.    A slight amount of redness in a scar is expected for the first six months.  After six months, the redness will fade and the scar will soften and fade.  The color difference becomes less noticeable with time.  If there are any problems, return for a post-op surgery check at your earliest convenience.  To improve the appearance of  the scar, you can use silicone scar gel, cream, or sheets (such as Mederma or Serica) every night for up to one year. These are available over the counter (without a prescription).  Please call our office at [phone] for any questions or concerns. Electrodesiccation and Curettage ("Scrape and Burn") Wound Care Instructions  10. Leave the original bandage on for 24 hours if possible.  If the bandage becomes soaked or soiled before that time, it is OK to remove it and examine the wound.  A small amount of post-operative bleeding is normal.  If excessive bleeding occurs, remove the bandage, place gauze over the site and apply continuous pressure (no peeking) over the area for 30 minutes. If this does not work, please call our clinic as soon as possible or page your doctor if it is after hours.   11. Once a day, cleanse the wound with soap and water. It is fine to shower. If a thick crust develops you may use a Q-tip dipped into dilute hydrogen peroxide (mix 1:1 with water) to dissolve it.  Hydrogen peroxide can slow the healing process, so use it only as needed.    12. After washing, apply petroleum jelly (Vaseline) or an antibiotic ointment if your doctor prescribed one for you, followed by a bandage.    13. For best healing, the wound should be covered with a layer of ointment at all times. If you are not able to keep the area covered with a bandage to hold the ointment in place, this may mean re-applying the ointment several times a day.  Continue this wound care until the wound has healed and is no longer open. It may take several weeks for the wound to heal and close.  Itching and mild discomfort is normal during the healing process.  If you have any discomfort, you can take Tylenol (acetaminophen) or ibuprofen as directed on the bottle. (Please do not take these if you have an allergy to them or cannot take them for another reason).  Some redness, tenderness and white or yellow material in  the wound is normal healing.  If the area becomes very sore and red, or develops a thick yellow-green material (pus), it may be infected; please notify us.    Wound healing continues for up to one year following surgery. It is not unusual to experience pain in the scar from time to time during the interval.  If the pain becomes severe or the scar thickens, you should notify the office.    A slight amount of redness in a scar is expected for the first six months.  After six months, the redness will fade and the scar will soften and fade.  The color difference becomes less noticeable with time.  If there are any problems, return for a post-op surgery check at your earliest  convenience.  To improve the appearance of the scar, you can use silicone scar gel, cream, or sheets (such as Mederma or Serica) every night for up to one year. These are available over the counter (without a prescription).  Please call our office at [phone] for any questions or concerns.

## 2020-01-10 ENCOUNTER — Encounter: Payer: Self-pay | Admitting: Dermatology

## 2020-01-13 ENCOUNTER — Telehealth: Payer: Self-pay

## 2020-01-13 DIAGNOSIS — F5081 Binge eating disorder: Secondary | ICD-10-CM

## 2020-01-13 MED ORDER — BUSPIRONE HCL 15 MG PO TABS: 15.0000 mg | ORAL_TABLET | Freq: Three times a day (TID) | ORAL | 0 refills | Status: DC

## 2020-01-13 NOTE — Telephone Encounter (Signed)
pt is returning your phone call from today

## 2020-01-13 NOTE — Telephone Encounter (Signed)
pt called states that she still having the same situation and she doesnt have time to do yoga and whe wanted to know if you would incread bupropion

## 2020-01-13 NOTE — Telephone Encounter (Signed)
Returned call to patient.  Discussed increasing BuSpar to 15 mg 3 times a day since she is having crying spells.  She reports relationship struggles with her brother which could have triggered this.  Also offered referral for CBT.  She will Biochemist, clinical know.

## 2020-01-13 NOTE — Telephone Encounter (Signed)
Attempted to reach patient. Left message.

## 2020-01-16 ENCOUNTER — Ambulatory Visit: Payer: 59 | Attending: Family Medicine | Admitting: Physical Therapy

## 2020-01-16 ENCOUNTER — Telehealth: Payer: Self-pay

## 2020-01-16 ENCOUNTER — Encounter: Payer: Self-pay | Admitting: Physical Therapy

## 2020-01-16 ENCOUNTER — Other Ambulatory Visit: Payer: Self-pay

## 2020-01-16 DIAGNOSIS — M545 Low back pain, unspecified: Secondary | ICD-10-CM

## 2020-01-16 DIAGNOSIS — M6281 Muscle weakness (generalized): Secondary | ICD-10-CM | POA: Diagnosis present

## 2020-01-16 NOTE — Telephone Encounter (Signed)
Advised patient of results/hd  

## 2020-01-16 NOTE — Telephone Encounter (Signed)
-----   Message from Ralene Bathe, MD sent at 01/16/2020 12:22 PM EDT ----- Skin (M), left lateral breast PIGMENTED SEBORRHEIC KERATOSIS  Benign keratosis No further treatment needed

## 2020-01-16 NOTE — Therapy (Signed)
[facility] [address] [place], [address] Phone: [phone]   Fax:  [phone]  Physical Therapy Evaluation  Patient Details  Name: Barbara Russo MRN: [id number] Date of Birth: August 20, 1960 Referring Provider (PT): Barbara Dillon, NP   Encounter Date: 01/16/2020   PT End of Session - 01/16/20 1756    Visit Number 1    Number of Visits 24    Date for PT Re-Evaluation 04/09/20    PT Start Time 2035    PT Stop Time 5974    PT Time Calculation (min) 70 min    Activity Tolerance Patient tolerated treatment well;No increased pain    Behavior During Therapy WFL for tasks assessed/performed           Past Medical History:  Diagnosis Date  . Anxiety   . Depression   . Dysplastic nevus 10/03/2019   L lower back paraspinal mod to severe (shave removal)  . Squamous cell carcinoma of skin 09/26/2019   SCCIS - L prox lat pretibial     Past Surgical History:  Procedure Laterality Date  . BREAST BIOPSY Left 2008   neg    There were no vitals filed for this visit.    Subjective Assessment - 01/16/20 1619    Subjective Patient states condition has been going on most of her adult life. She met this wonder physiatrist through her headache doctor because she has migraines. When she moved her a couple of years ago she didn't seek anyone for her back because the warm weather in general and she no longer wear any kind of heel regularly and she was no longer on her feet a lot. Migraines were so much worse here and the back pain was better at first. Her back has started bothering her more with daily aggravation and every once in a while it "goes out" for 2-5 days and one day it was a week. It is not often. Her NP thought she should do something about it. It started getting worse at the beginning of the year with more frequent flairs. She saw her physiatrist (near [place]) about every 6 weeks and did all of the things  he suggested which included deep shots in the spine that helped. He also promoted exercise and fitness. She feels that she should be keeping her back strong. Muscle relaxers make her tired. Barbara Russo gave her a little something for pain (tramadol) if she has an episode and has not taken it yet. She responded well to a steroid shot in her neck and her headaches and neck is better. No longer has terrible migraines since she moved down here. Last episode of back pain about 2 months ago. Last shots she had was in [place]. She did get lidocaine shots every 6 weeks (mildly helpful). She has had whiplash a couple of times but no other major trauma to the spine. No spinal surgeries. Loves yoga but getting up and down is bothersome. She likes having the right teacher. Does not currently have any place in mind.    Pertinent History Patient is a 59 y.o. female who presents to outpatient physical therapy with a referral for medical diagnosis lumbar DDD (degenerative disc disease), back muscle spasm. This patient's chief complaints consist of chronic low back pain with increasing episodes of severe pain, leading to the following functional deficits: when her symptoms flair it is very difficult to work, it makes ADLs, and IADLs very hard. It disrupts her concentration  which is instrumental to her work. She is fearful it may cripple her for a day or a few days and she would not be able to work.  Relevant past medical history and comorbidities include migraines and neck pain, major depressive disorder (patient feels well managed at this point), skin cancer spot removed recently, Hx GI discomfort/problems. Patient denies hx stroke, seizures, lung problem, major cardiac events, diabetes, changes in bowel or bladder problems, new onset stumbling or dropping things.    Limitations --   when her symptoms flair it is very difficult to work, it makes ADLs, and IADLs very hard. It disrupts her concentration which is instrumental to  her work. She is fearful it may cripple her for a day or a few days and she would not be able to work.   Diagnostic tests CT abdomen report 08/09/2019: "IMPRESSION:1. Distal colonic diverticulosis without diverticulitis.2. Mild fatty infiltration of the liver.3. No acute intra-abdominal or intrapelvic process. No explanation for weight loss."    Patient Stated Goals would like to make the back feel better and not have these episodes    Currently in Pain? Yes    Pain Score 1    Worst: 7-8/10; Best: 0.5/10   Pain Location Back    Pain Orientation Right;Left;Lower   lower lumbar spine and sacral area with radiation towars right low back and B glutes. denies numbnes tingling or radiation in to either leg ever   Pain Type Chronic pain    Pain Radiating Towards glutes    Pain Onset More than a month ago    Pain Frequency Intermittent    Aggravating Factors  end of day is worse, beginning of the day, prolonged sitting, prolonged standing (standing worse), worse in bed on back flexed with computer,    Pain Relieving Factors movement in general, mid-day, nothing makes it feel better when it is hurting.    Effect of Pain on Daily Activities when her symptoms flair it is very difficult to work, it makes ADLs, and IADLs very hard. It disrupts her concentration which is instrumental to her work. She is fearful it may cripple her for a day or a few days and she would not be able to work.              [facility] PT Assessment - 01/16/20 0001      Assessment   Medical Diagnosis lumbar DDD (degenerative disc disease), back muscle spasm    Referring Provider (PT) Barbara Dillon, NP    Onset Date/Surgical Date 06/14/19    Hand Dominance Right    Next MD Visit  in a couple of months    Prior Therapy has had PT for low back and she thinks it helped      Precautions   Precautions None      Restrictions   Weight Bearing Restrictions No      Balance Screen   Has the patient fallen in the past 6 months No     Has the patient had a decrease in activity level because of a fear of falling?  No    Is the patient reluctant to leave their home because of a fear of falling?  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  Marland Kitchen Posture correction: improved kyphosis . Tremor: none . Muscle bulk: generally overall mildly decreased consistent with lack of regular exercise or challenging physical activity.  . Bed mobility: supine <> sit and rolling WFL . Transfers: sit <> stand WFL . Gait: grossly WFL for household and short community ambulation. More detailed gait analysis deferred to later date as needed.   NEUROLOGICAL  Deep Tendon Reflexes R/L  . 2+/2+ Quadriceps reflex (L4) Neurodynamic Tests   . SLR: negative bilaterally   SPINE MOTION  Lumbar AROM *Indicates pain - Flexion: = fingers in front of ankles - Extension: = 75% ERP in low back  - Rotation: R = WFL, L = WFL - Side Flexion: R = WFL, L = WFL except tightness on R   PERIPHERAL JOINT MOTION (in degrees)  Active Range of Motion (AROM) Comments: BLE appears WFL for basic mobility except lacking in hip extension or sidlying hip ER (clam shell) due to motor control/activation impairment.   Passive Range of Motion (PROM) Comments: BLE appear WFL for basic mobility  MUSCLE PERFORMANCE (MMT):  *Indicates pain 01/16/2020 Date Date  Joint/Motion R/L R/L R/L  Hip     Flexion 4/4 / /    Extension (knee ext) 3/3 / /  Extension (knee flex) 4/4-(P) / /  Abduction 3+/4+ / /  Knee     Extension 5/5 / /  Flexion 5/5 / /  Ankle/Foot     Dorsiflexion  5/5 / /  Great toe extension 4/4 / /  Eversion 5/5 / /  Plantarflexion 5/5/ / /  Comments: able to toe walk and heel walk  REPEATED MOTIONS TESTING: Motion/Technique sets x reps During After ROM  Lumbar extension in prone  1x10 Twinge of pain in back last rep No worse No effect    ACCESSORY MOTION:  - Apprehensive to CPA at L4, L5 with some reproduction of pain there. No reproduction of symptoms with CPA to sacrum.   PALPATION: - Mildly TTP in bilateral deep gluteal region, R > L   FUNCTIONAL/BALANCE TESTS: Abdominal endurance: able to hold 50 seconds in hooklying with hips flexed 90 degrees with trunk off mat at 45 degree angle and hands across chest.   EDUCATION/COGNITION: Patient is alert and oriented X 4.  Objective measurements completed on examination: See above findings.    TREATMENT:   Therapeutic exercise: to centralize symptoms and improve ROM, strength, muscular endurance, and activity tolerance required for successful completion of functional activities.  - Education on diagnosis, prognosis, POC, anatomy and physiology of current condition.  - Education on HEP including handout  - hooklying bridge x 10 - hooklying abdominal brace with march, x10 each side - sidelying clamshell x 10 with R LE and self palpation to prevent excessive trunk rotation and isolate hip external rotators.   HOME EXERCISE PROGRAM Access Code: [email or web address] URL: [email or web address] Date: 01/16/2020 Prepared by: Rosita Kea  Exercises Beginner Bridge - 1 x daily - 2 sets - 15 reps - 1 second hold Supine March - 1 x daily - 2 sets - 15 reps - 1 second hold Clam - 1 x daily - 2 sets -  15 reps - 1 second hold Take at least one day off of exercise if desired.   Patient response to treatment:  Pt tolerated  treatment well. Pt was able to complete all exercises with minimal to no lasting increase in pain or discomfort. Pt required multimodal cuing for proper technique and to facilitate improved neuromuscular control, strength, range of motion, and functional ability resulting in improved performance and form.      PT Education - 01/16/20 1755    Education Details Exercise purpose/form. Self management techniques. Education on diagnosis, prognosis, POC, anatomy and physiology of current condition Education on HEP including handout    Person(s) Educated Patient    Methods Explanation;Demonstration;Tactile cues;Verbal cues;Handout    Comprehension Verbalized understanding;Returned demonstration;Verbal cues required;Tactile cues required;Need further instruction            PT Short Term Goals - 01/16/20 1756      PT SHORT TERM GOAL #1   Title Be independent with initial home exercise program for self-management of symptoms.    Baseline Initial HEP provided at IE (01/16/2020);    Time 2    Period Weeks    Status New    Target Date 01/30/20             PT Long Term Goals - 01/16/20 1806      PT LONG TERM GOAL #1   Title Be independent with a long-term home exercise program for self-management of symptoms.    Baseline Initial HEP provided at IE (01/16/2020);    Time 12    Period Weeks    Status New   TARGET DATE FOR ALL LONG TERM GOALS: 04/09/2020     PT LONG TERM GOAL #2   Title Demonstrate improved FOTO score by 10 or more points to demonstrate improvement in overall condition and self-reported functional ability.    Baseline test at visit 2 (01/16/2020);    Time 12    Period Weeks    Status New      PT LONG TERM GOAL #3   Title Improve core strength to hold 45 degree unsupported reclined position for 60 seconds and modified plank for 60 seconds for improved core strength to allow patient to complete valued functional tasks such as lifting, bending, sitting with less difficulty.     Baseline 50 seconds with difficulty holding position steady (01/16/2020);    Time 12    Period Weeks    Status New      PT LONG TERM GOAL #4   Title Patient will demonstrate B hip strength 4+/5 to demonstrate functional strength for improved lifting, carrying, and bending.    Baseline see objective exam (01/16/2020);    Time 12    Period Weeks    Status New    Target Date 04/09/20      PT LONG TERM GOAL #5   Title Complete community, work and/or recreational activities without limitation due to current condition.    Baseline when her symptoms flair it is very difficult to work, it makes ADLs, and IADLs very hard. It disrupts her concentration which is instrumental to her work. She is fearful it may cripple her for a day or a few days and she would not be able to work. (01/16/2020);    Time 12    Period Weeks    Status New                  Plan - 01/16/20 1818    Clinical Impression Statement  Patient is a 59 y.o. female referred to outpatient physical therapy with a medical diagnosis of lumbar DDD (degenerative disc disease), back muscle spasm who presents with signs and symptoms consistent with chronic back pain with history of increasingly frequent episodes of acute pain that can be debilitating. Patient demonstrates lack of trunk and LE muscle power, strength, and endurance and decreased motor control that is likely contributing to her chronic and episodic back pain. Will continue to assess as needed if symptoms fluctuate.  Patient presents with significant pain, muscle performance (strength/power/endurance), motor control, activity tolerance, joint mobility, and muscle irritation impairments that are limiting ability to complete her usual activities including work, ADLs, IADLs, and concentration for tasks without difficulty when her symptoms flair. Also decreasing quality of life and increasing anxiety about her ability to manage pain and symptoms. Patient will benefit from skilled  physical therapy intervention to address current body structure impairments and activity limitations to improve function and work towards goals set in current POC in order to return to prior level of function or maximal functional improvement.    Personal Factors and Comorbidities Age;Behavior Pattern;Comorbidity 3+    Comorbidities Relevant past medical history and comorbidities include migraines and neck pain, major depressive disorder (patient feels well managed at this point), skin cancer spot removed recently, Hx GI discomfort/problems.    Examination-Activity Limitations Bed Mobility;Bend;Locomotion Level;Stand;Lift;Hygiene/Grooming;Squat;Bathing;Caring for Others;Carry;Sit;Transfers    Examination-Participation Restrictions Occupation;Driving;Community Activity;Cleaning;Laundry;Shop    Stability/Clinical Decision Making Stable/Uncomplicated    Clinical Decision Making Low    Rehab Potential Good    PT Frequency 2x / week    PT Duration 12 weeks    PT Treatment/Interventions ADLs/Self Care Home Management;Moist Heat;Cryotherapy;Therapeutic activities;Therapeutic exercise;Neuromuscular re-education;Patient/family education;Manual techniques;Passive range of motion;Dry needling;Spinal Manipulations;Joint Manipulations    PT Next Visit Plan strengthening    PT Home Exercise Plan Medbridge Access Code: [email or web address]    Consulted and Agree with Plan of Care Patient           Patient will benefit from skilled therapeutic intervention in order to improve the following deficits and impairments:  Pain, Increased muscle spasms, Hypermobility, Decreased coordination, Decreased activity tolerance, Decreased endurance, Decreased range of motion, Decreased strength, Hypomobility, Impaired perceived functional ability, Impaired flexibility  Visit Diagnosis: Bilateral low back pain without sciatica, unspecified chronicity  Muscle weakness (generalized)     Problem List Patient Active Problem List    Diagnosis Date Noted  . MDD (major depressive disorder), recurrent, in full remission ([place]) 10/03/2019  . MDD (major depressive disorder), recurrent, in partial remission ([place]) 02/11/2019  . Binge eating disorder 02/11/2019  . Vitamin D deficiency 08/14/2018  . Intractable chronic migraine without aura and without status migrainosus 03/04/2018  . Postmenopausal 02/15/2018  . Pure hypercholesterolemia 02/15/2018  . Unintended weight gain 02/15/2018    Everlean Alstrom. Graylon Good, PT, DPT 01/16/20, 6:20 PM  [facility] [address] [place], [address] Phone: [phone]   Fax:  [phone]  Name: Barbara [facility]so MRN: [id number] Date of Birth: 09/26/1960

## 2020-01-21 ENCOUNTER — Ambulatory Visit: Payer: 59 | Admitting: Physical Therapy

## 2020-01-21 ENCOUNTER — Encounter: Payer: Self-pay | Admitting: Physical Therapy

## 2020-01-21 ENCOUNTER — Ambulatory Visit: Payer: 59 | Admitting: Dermatology

## 2020-01-21 ENCOUNTER — Other Ambulatory Visit: Payer: Self-pay

## 2020-01-21 DIAGNOSIS — M545 Low back pain, unspecified: Secondary | ICD-10-CM

## 2020-01-21 DIAGNOSIS — Z86007 Personal history of in-situ neoplasm of skin: Secondary | ICD-10-CM

## 2020-01-21 DIAGNOSIS — M6281 Muscle weakness (generalized): Secondary | ICD-10-CM

## 2020-01-21 MED ORDER — MUPIROCIN 2 % EX OINT: 1.0000 "application " | TOPICAL_OINTMENT | Freq: Every day | CUTANEOUS | 0 refills | Status: DC

## 2020-01-21 NOTE — Therapy (Signed)
[facility] [address], [place], [address] Phone: [phone]   Fax:  [phone]  Physical Therapy Treatment  Patient Details  Name: Barbara Russo MRN: [id number] Date of Birth: 1961/05/27 Referring Provider (PT): Allene Dillon, NP   Encounter Date: 01/21/2020   PT End of Session - 01/21/20 1723    Visit Number 2    Number of Visits 24    Date for PT Re-Evaluation 04/09/20    PT Start Time 3614    PT Stop Time 1800    PT Time Calculation (min) 53 min    Activity Tolerance Patient tolerated treatment well;No increased pain    Behavior During Therapy WFL for tasks assessed/performed           Past Medical History:  Diagnosis Date  . Anxiety   . Depression   . Dysplastic nevus 10/03/2019   L lower back paraspinal mod to severe (shave removal)  . Squamous cell carcinoma of skin 09/26/2019   SCCIS - L prox lat pretibial     Past Surgical History:  Procedure Laterality Date  . BREAST BIOPSY Left 2008   neg    There were no vitals filed for this visit.   Subjective Assessment - 01/21/20 1715    Subjective Patient reports she has a slight ache in her low back upon arrival. States she is very embarrased because she lost her paper for HEP. She remembered 2 of the three exercises. States it is hard for her to keep track of paperwork. She states she was more achy than she expected following last session but it was better by the next day. She is achy at night following her HEP and doing some straddle stretches while playing with the cat for 20 min (prescribed for cat's weight loss).    Pertinent History Patient is a 59 y.o. female who presents to outpatient physical therapy with a referral for medical diagnosis lumbar DDD (degenerative disc disease), back muscle spasm. This patient's chief complaints consist of chronic low back pain with increasing episodes of severe pain, leading to the following  functional deficits: when her symptoms flair it is very difficult to work, it makes ADLs, and IADLs very hard. It disrupts her concentration which is instrumental to her work. She is fearful it may cripple her for a day or a few days and she would not be able to work.  Relevant past medical history and comorbidities include migraines and neck pain, major depressive disorder (patient feels well managed at this point), skin cancer spot removed recently, Hx GI discomfort/problems. Patient denies hx stroke, seizures, lung problem, major cardiac events, diabetes, changes in bowel or bladder problems, new onset stumbling or dropping things.    Limitations --   when her symptoms flair it is very difficult to work, it makes ADLs, and IADLs very hard. It disrupts her concentration which is instrumental to her work. She is fearful it may cripple her for a day or a few days and she would not be able to work.   Diagnostic tests CT abdomen report 08/09/2019: "IMPRESSION:1. Distal colonic diverticulosis without diverticulitis.2. Mild fatty infiltration of the liver.3. No acute intra-abdominal or intrapelvic process. No explanation for weight loss."    Patient Stated Goals would like to make the back feel better and not have these episodes    Currently in Pain? Yes    Pain Score 1     Pain Location Back    Pain Orientation  Lower    Pain Descriptors / Indicators Aching    Pain Onset More than a month ago            TREATMENT:   Therapeutic exercise:to centralize symptoms and improve ROM, strength, muscular endurance, and activity tolerance required for successful completion of functional activities.  - hooklying bridge x 30 - sidelying clamshell x20 on each side with self palpation to prevent excessive trunk rotation and isolate hip external rotators.   - hooklying abdominal brace with march, 1x15 each side (easy) - hooklying abdominal brace with alternating LE extension x 10 each side (easy) - reverse curl  hold, 2x10 seconds - full dead bug x 3 each side, then 2x5 each side. Required extra time to practice to improve technique.  - dead bug in hooklying x 10 each side (possibly not challenging enough).  - bird dog 3x10 each side following introduction with instructions on how to find proper spinal position, then progression from alternating arms to alternating legs to both together.  - Education on HEP including handout  HOME EXERCISE PROGRAM Access Code: [email or web address] URL: [email or web address] Date: 01/21/2020 Prepared by: Rosita Kea  Exercises Beginner Bridge - 1 x daily - 2 sets - 15 reps - 1 second hold Clam - 1 x daily - 2 sets - 15 reps - 1 second hold Supine Dead Bug with Leg Extension - 1 x daily - 3 sets - 10 reps - 1 second hold    PT Education - 01/21/20 1722    Education Details Exercise purpose/form. Self management techniques.    Person(s) Educated Patient    Methods Explanation;Demonstration;Tactile cues;Verbal cues;Handout    Comprehension Verbalized understanding;Returned demonstration;Verbal cues required;Tactile cues required;Need further instruction            PT Short Term Goals - 01/21/20 2049      PT SHORT TERM GOAL #1   Title Be independent with initial home exercise program for self-management of symptoms.    Baseline Initial HEP provided at IE (01/16/2020);    Time 2    Period Weeks    Status Achieved    Target Date 01/30/20             PT Long Term Goals - 01/16/20 1806      PT LONG TERM GOAL #1   Title Be independent with a long-term home exercise program for self-management of symptoms.    Baseline Initial HEP provided at IE (01/16/2020);    Time 12    Period Weeks    Status New   TARGET DATE FOR ALL LONG TERM GOALS: 04/09/2020     PT LONG TERM GOAL #2   Title Demonstrate improved FOTO score by 10 or more points to demonstrate improvement in overall condition and self-reported functional ability.    Baseline test at visit 2  (01/16/2020);    Time 12    Period Weeks    Status New      PT LONG TERM GOAL #3   Title Improve core strength to hold 45 degree unsupported reclined position for 60 seconds and modified plank for 60 seconds for improved core strength to allow patient to complete valued functional tasks such as lifting, bending, sitting with less difficulty.    Baseline 50 seconds with difficulty holding position steady (01/16/2020);    Time 12    Period Weeks    Status New      PT LONG TERM GOAL #4   Title Patient will demonstrate B hip strength 4+/5  to demonstrate functional strength for improved lifting, carrying, and bending.    Baseline see objective exam (01/16/2020);    Time 12    Period Weeks    Status New    Target Date 04/09/20      PT LONG TERM GOAL #5   Title Complete community, work and/or recreational activities without limitation due to current condition.    Baseline when her symptoms flair it is very difficult to work, it makes ADLs, and IADLs very hard. It disrupts her concentration which is instrumental to her work. She is fearful it may cripple her for a day or a few days and she would not be able to work. (01/16/2020);    Time 12    Period Weeks    Status New                 Plan - 01/21/20 2049    Clinical Impression Statement Patient tolerated treatment well overall and reported she felt good following session. Felt she benefited from shoulder flexion during exercises. Focused on improving lumbopelvic motor control, stability, strength and endurance. Patient would benefit from continued management of limiting condition by skilled physical therapist to address remaining impairments and functional limitations to work towards stated goals and return to PLOF or maximal functional independence.    Personal Factors and Comorbidities Age;Behavior Pattern;Comorbidity 3+    Comorbidities Relevant past medical history and comorbidities include migraines and neck pain, major depressive  disorder (patient feels well managed at this point), skin cancer spot removed recently, Hx GI discomfort/problems.    Examination-Activity Limitations Bed Mobility;Bend;Locomotion Level;Stand;Lift;Hygiene/Grooming;Squat;Bathing;Caring for Others;Carry;Sit;Transfers    Examination-Participation Restrictions Occupation;Driving;Community Activity;Cleaning;Laundry;Shop    Stability/Clinical Decision Making Stable/Uncomplicated    Rehab Potential Good    PT Frequency 2x / week    PT Duration 12 weeks    PT Treatment/Interventions ADLs/Self Care Home Management;Moist Heat;Cryotherapy;Therapeutic activities;Therapeutic exercise;Neuromuscular re-education;Patient/family education;Manual techniques;Passive range of motion;Dry needling;Spinal Manipulations;Joint Manipulations    PT Next Visit Plan strengthening    PT Home Exercise Plan Medbridge Access Code: [email or web address]    Consulted and Agree with Plan of Care Patient           Patient will benefit from skilled therapeutic intervention in order to improve the following deficits and impairments:  Pain, Increased muscle spasms, Hypermobility, Decreased coordination, Decreased activity tolerance, Decreased endurance, Decreased range of motion, Decreased strength, Hypomobility, Impaired perceived functional ability, Impaired flexibility  Visit Diagnosis: Bilateral low back pain without sciatica, unspecified chronicity  Muscle weakness (generalized)     Problem List Patient Active Problem List   Diagnosis Date Noted  . MDD (major depressive disorder), recurrent, in full remission ([place]) 10/03/2019  . MDD (major depressive disorder), recurrent, in partial remission ([facility]) 02/11/2019  . Binge eating disorder 02/11/2019  . Vitamin D deficiency 08/14/2018  . Intractable chronic migraine without aura and without status migrainosus 03/04/2018  . Postmenopausal 02/15/2018  . Pure hypercholesterolemia 02/15/2018  . Unintended weight gain 02/15/2018     Everlean Alstrom. Graylon Good, PT, DPT 01/21/20, 8:50 PM  [facility] [address] [place], [address] Phone: [phone]   Fax:  [phone]  Name: Barbara Russo MRN: [id number] Date of Birth: [birth year]

## 2020-01-21 NOTE — Progress Notes (Signed)
   Follow-Up Visit   Subjective  Barbara Russo is a 59 y.o. female who presents for the following: Follow-up (Recheck EDC site of left prox pretibial).  The following portions of the chart were reviewed this encounter and updated as appropriate:  Tobacco  Allergies  Meds  Problems  Med Hx  Surg Hx  Fam Hx     Review of Systems:  No other skin or systemic complaints except as noted in HPI or Assessment and Plan.  Objective  Well appearing patient in no apparent distress; mood and affect are within normal limits.  A focused examination was performed including leftr lower leg. Relevant physical exam findings are noted in the Assessment and Plan.  Objective  Left prox pretibial: Healing EDC site. No evidence of infection.   Assessment & Plan  History of squamous cell carcinoma in situ (SCCIS) of skin Left prox pretibial Reassured is healing as expected and no evidence of infection. Advised lower legs heal slowly. Cleansed and re-dressed. Start: mupirocin ointment (BACTROBAN) 2 % - Left prox pretibial   Return for follow up as scheduled.  I, Ashok Cordia, CMA, am acting as scribe for Sarina Ser, MD .  Documentation: I have reviewed the above documentation for accuracy and completeness, and I agree with the above.  Sarina Ser, MD

## 2020-01-22 ENCOUNTER — Encounter: Payer: Self-pay | Admitting: Dermatology

## 2020-01-23 ENCOUNTER — Encounter: Payer: Self-pay | Admitting: Physical Therapy

## 2020-01-23 ENCOUNTER — Other Ambulatory Visit: Payer: Self-pay

## 2020-01-23 ENCOUNTER — Ambulatory Visit: Payer: 59 | Admitting: Physical Therapy

## 2020-01-23 DIAGNOSIS — M545 Low back pain, unspecified: Secondary | ICD-10-CM

## 2020-01-23 DIAGNOSIS — M6281 Muscle weakness (generalized): Secondary | ICD-10-CM

## 2020-01-23 NOTE — Therapy (Signed)
[facility] [address] [place], [address] Phone: [phone]   Fax:  [phone]  Physical Therapy Treatment  Patient Details  Name: Nydia Ytuarte MRN: [id number] Date of Birth: 03/27/1961 Referring Provider (PT): Allene Dillon, NP   Encounter Date: 01/23/2020   PT End of Session - 01/23/20 1527    Visit Number 3    Number of Visits 24    Date for PT Re-Evaluation 04/09/20    PT Start Time 1520    PT Stop Time 1600    PT Time Calculation (min) 40 min    Activity Tolerance Patient tolerated treatment well;No increased pain    Behavior During Therapy WFL for tasks assessed/performed           Past Medical History:  Diagnosis Date  . Anxiety   . Depression   . Dysplastic nevus 10/03/2019   L lower back paraspinal mod to severe (shave removal)  . Squamous cell carcinoma of skin 09/26/2019   SCCIS - L prox lat pretibial     Past Surgical History:  Procedure Laterality Date  . BREAST BIOPSY Left 2008   neg    There were no vitals filed for this visit.   Subjective Assessment - 01/23/20 1524    Subjective Patient reports she is feeling well today but was working many many hours since her last PT appointment and feels terrible that she did not do her HEP yesterday. Felt okay following last treatment session.    Pertinent History Patient is a 59 y.o. female who presents to outpatient physical therapy with a referral for medical diagnosis lumbar DDD (degenerative disc disease), back muscle spasm. This patient's chief complaints consist of chronic low back pain with increasing episodes of severe pain, leading to the following functional deficits: when her symptoms flair it is very difficult to work, it makes ADLs, and IADLs very hard. It disrupts her concentration which is instrumental to her work. She is fearful it may cripple her for a day or a few days and she would not be able to work.  Relevant  past medical history and comorbidities include migraines and neck pain, major depressive disorder (patient feels well managed at this point), skin cancer spot removed recently, Hx GI discomfort/problems. Patient denies hx stroke, seizures, lung problem, major cardiac events, diabetes, changes in bowel or bladder problems, new onset stumbling or dropping things.    Limitations --   when her symptoms flair it is very difficult to work, it makes ADLs, and IADLs very hard. It disrupts her concentration which is instrumental to her work. She is fearful it may cripple her for a day or a few days and she would not be able to work.   Diagnostic tests CT abdomen report 08/09/2019: "IMPRESSION:1. Distal colonic diverticulosis without diverticulitis.2. Mild fatty infiltration of the liver.3. No acute intra-abdominal or intrapelvic process. No explanation for weight loss."    Patient Stated Goals would like to make the back feel better and not have these episodes    Currently in Pain? No/denies    Pain Onset More than a month ago           TREATMENT:  Therapeutic exercise:to centralize symptoms and improve ROM, strength, muscular endurance, and activity tolerance required for successful completion of functional activities. - hooklying bridge x 20 min - sidelying clamshell x30 on each side with self palpation to prevent excessive trunk rotation and isolate hip external rotators.  - reverse curl  hold, 10x10 seconds - full dead bug, then 3x5 each side. Required extra time to practice to improve technique.  - bird dog 3x10 each side following introduction with instructions on how to find proper spinal position, then progression from alternating arms to alternating legs to both together.   - Education on HEP including handout   HOME EXERCISE PROGRAM Access Code: [email or web address] URL: [email or web address] Date: 01/21/2020 Prepared by: Rosita Kea  Exercises Beginner Bridge - 1 x daily - 2 sets  - 15 reps - 1 second hold Clam - 1 x daily - 2 sets - 15 reps - 1 second hold Supine Dead Bug with Leg Extension - 1 x daily - 3 sets - 10 reps - 1 second hold    PT Education - 01/23/20 1526    Education Details Exercise purpose/form. Self management techniques.    Person(s) Educated Patient    Methods Explanation;Demonstration;Tactile cues;Verbal cues    Comprehension Verbalized understanding;Returned demonstration;Verbal cues required;Tactile cues required;Need further instruction            PT Short Term Goals - 01/21/20 2049      PT SHORT TERM GOAL #1   Title Be independent with initial home exercise program for self-management of symptoms.    Baseline Initial HEP provided at IE (01/16/2020);    Time 2    Period Weeks    Status Achieved    Target Date 01/30/20             PT Long Term Goals - 01/16/20 1806      PT LONG TERM GOAL #1   Title Be independent with a long-term home exercise program for self-management of symptoms.    Baseline Initial HEP provided at IE (01/16/2020);    Time 12    Period Weeks    Status New   TARGET DATE FOR ALL LONG TERM GOALS: 04/09/2020     PT LONG TERM GOAL #2   Title Demonstrate improved FOTO score by 10 or more points to demonstrate improvement in overall condition and self-reported functional ability.    Baseline test at visit 2 (01/16/2020);    Time 12    Period Weeks    Status New      PT LONG TERM GOAL #3   Title Improve core strength to hold 45 degree unsupported reclined position for 60 seconds and modified plank for 60 seconds for improved core strength to allow patient to complete valued functional tasks such as lifting, bending, sitting with less difficulty.    Baseline 50 seconds with difficulty holding position steady (01/16/2020);    Time 12    Period Weeks    Status New      PT LONG TERM GOAL #4   Title Patient will demonstrate B hip strength 4+/5 to demonstrate functional strength for improved lifting, carrying, and  bending.    Baseline see objective exam (01/16/2020);    Time 12    Period Weeks    Status New    Target Date 04/09/20      PT LONG TERM GOAL #5   Title Complete community, work and/or recreational activities without limitation due to current condition.    Baseline when her symptoms flair it is very difficult to work, it makes ADLs, and IADLs very hard. It disrupts her concentration which is instrumental to her work. She is fearful it may cripple her for a day or a few days and she would not be able to work. (01/16/2020);    Time 12  Period Weeks    Status New                 Plan - 01/23/20 1552    Clinical Impression Statement Patient tolerated treatment well overall and continued to practice core stabilization and motor control exercises. Also really felt she benefitted from lower trunk rotation. Patient would benefit from continued management of limiting condition by skilled physical therapist to address remaining impairments and functional limitations to work towards stated goals and return to PLOF or maximal functional independence.    Personal Factors and Comorbidities Age;Behavior Pattern;Comorbidity 3+    Comorbidities Relevant past medical history and comorbidities include migraines and neck pain, major depressive disorder (patient feels well managed at this point), skin cancer spot removed recently, Hx GI discomfort/problems.    Examination-Activity Limitations Bed Mobility;Bend;Locomotion Level;Stand;Lift;Hygiene/Grooming;Squat;Bathing;Caring for Others;Carry;Sit;Transfers    Examination-Participation Restrictions Occupation;Driving;Community Activity;Cleaning;Laundry;Shop    Stability/Clinical Decision Making Stable/Uncomplicated    Rehab Potential Good    PT Frequency 2x / week    PT Duration 12 weeks    PT Treatment/Interventions ADLs/Self Care Home Management;Moist Heat;Cryotherapy;Therapeutic activities;Therapeutic exercise;Neuromuscular re-education;Patient/family  education;Manual techniques;Passive range of motion;Dry needling;Spinal Manipulations;Joint Manipulations    PT Next Visit Plan strengthening    PT Home Exercise Plan Medbridge Access Code: [email or web address]    Consulted and Agree with Plan of Care Patient           Patient will benefit from skilled therapeutic intervention in order to improve the following deficits and impairments:  Pain, Increased muscle spasms, Hypermobility, Decreased coordination, Decreased activity tolerance, Decreased endurance, Decreased range of motion, Decreased strength, Hypomobility, Impaired perceived functional ability, Impaired flexibility  Visit Diagnosis: Bilateral low back pain without sciatica, unspecified chronicity  Muscle weakness (generalized)     Problem List Patient Active Problem List   Diagnosis Date Noted  . MDD (major depressive disorder), recurrent, in full remission ([place]) 10/03/2019  . MDD (major depressive disorder), recurrent, in partial remission ([place]) 02/11/2019  . Binge eating disorder 02/11/2019  . Vitamin D deficiency 08/14/2018  . Intractable chronic migraine without aura and without status migrainosus 03/04/2018  . Postmenopausal 02/15/2018  . Pure hypercholesterolemia 02/15/2018  . Unintended weight gain 02/15/2018    Everlean Alstrom. Graylon Good, PT, DPT 01/23/20, 3:53 PM  [facility] [address], [place], [address] Phone: [phone]   Fax:  [phone]  Name: Joanny Dupree MRN: [id number] Date of Birth: May 13, 1961

## 2020-01-27 ENCOUNTER — Encounter: Payer: Self-pay | Admitting: Physical Therapy

## 2020-01-27 ENCOUNTER — Other Ambulatory Visit: Payer: Self-pay

## 2020-01-27 ENCOUNTER — Ambulatory Visit: Payer: 59 | Admitting: Physical Therapy

## 2020-01-27 DIAGNOSIS — M545 Low back pain, unspecified: Secondary | ICD-10-CM

## 2020-01-27 DIAGNOSIS — M6281 Muscle weakness (generalized): Secondary | ICD-10-CM

## 2020-01-27 NOTE — Therapy (Signed)
[facility] [address], [place], [address] Phone: [phone]   Fax:  [phone]  Physical Therapy Treatment  Patient Details  Name: Barbara Russo MRN: [id number] Date of Birth: 10/03/1960 Referring Provider (PT): Allene Dillon, NP   Encounter Date: 01/27/2020   PT End of Session - 01/27/20 1740    Visit Number 4    Number of Visits 24    Date for PT Re-Evaluation 04/09/20    PT Start Time 6546    PT Stop Time 5035    PT Time Calculation (min) 40 min    Activity Tolerance Patient tolerated treatment well;No increased pain    Behavior During Therapy WFL for tasks assessed/performed           Past Medical History:  Diagnosis Date  . Anxiety   . Depression   . Dysplastic nevus 10/03/2019   L lower back paraspinal mod to severe (shave removal)  . Squamous cell carcinoma of skin 09/26/2019   SCCIS - L prox lat pretibial     Past Surgical History:  Procedure Laterality Date  . BREAST BIOPSY Left 2008   neg    There were no vitals filed for this visit.   Subjective Assessment - 01/27/20 1737    Subjective Patient reports she feels okay upon arrival. Has been moving around today but still feels pretty stiff in her back. She did her exercises once since her last PT session and worked and was very busy all weekened.    Pertinent History Patient is a 59 y.o. female who presents to outpatient physical therapy with a referral for medical diagnosis lumbar DDD (degenerative disc disease), back muscle spasm. This patient's chief complaints consist of chronic low back pain with increasing episodes of severe pain, leading to the following functional deficits: when her symptoms flair it is very difficult to work, it makes ADLs, and IADLs very hard. It disrupts her concentration which is instrumental to her work. She is fearful it may cripple her for a day or a few days and she would not be able to work.   Relevant past medical history and comorbidities include migraines and neck pain, major depressive disorder (patient feels well managed at this point), skin cancer spot removed recently, Hx GI discomfort/problems. Patient denies hx stroke, seizures, lung problem, major cardiac events, diabetes, changes in bowel or bladder problems, new onset stumbling or dropping things.    Limitations --   when her symptoms flair it is very difficult to work, it makes ADLs, and IADLs very hard. It disrupts her concentration which is instrumental to her work. She is fearful it may cripple her for a day or a few days and she would not be able to work.   Diagnostic tests CT abdomen report 08/09/2019: "IMPRESSION:1. Distal colonic diverticulosis without diverticulitis.2. Mild fatty infiltration of the liver.3. No acute intra-abdominal or intrapelvic process. No explanation for weight loss."    Patient Stated Goals would like to make the back feel better and not have these episodes    Currently in Pain? Yes    Pain Score 1     Pain Location Back    Pain Orientation Lower    Pain Descriptors / Indicators Aching    Pain Type Chronic pain    Pain Onset More than a month ago           TREATMENT:  Therapeutic exercise:to centralize symptoms and improve ROM, strength, muscular endurance, and activity tolerance  required for successful completion of functional activities. - hooklying low trunk rotation x 2 min - hooklying bridge x2 min - sidelying clamshell with yellow theraband around knees(L x22, x12, x7; R x18, 2x10) on each side withself palpation to prevent excessive trunk rotation and isolate hip external rotators. - reverse curl hold with yellow band around knees, 10x10 seconds  circuit: - full dead bug while holding stick, 3x5 each side. - bird dog 3x10 each side with cone over sacrum to improve control  HOME EXERCISE PROGRAM Access Code: [email or web address] URL: [email or web address] Date:  01/21/2020 Prepared by: Rosita Kea  Exercises Beginner Bridge - 1 x daily - 2 sets - 15 reps - 1 second hold Clam - 1 x daily - 2 sets - 15 reps - 1 second hold Supine Dead Bug with Leg Extension - 1 x daily - 3 sets - 10 reps - 1 second hold    PT Education - 01/27/20 1740    Education Details Exercise purpose/form. Self management techniques.    Person(s) Educated Patient    Methods Explanation;Demonstration;Tactile cues;Verbal cues    Comprehension Verbalized understanding;Returned demonstration;Verbal cues required;Tactile cues required;Need further instruction            PT Short Term Goals - 01/21/20 2049      PT SHORT TERM GOAL #1   Title Be independent with initial home exercise program for self-management of symptoms.    Baseline Initial HEP provided at IE (01/16/2020);    Time 2    Period Weeks    Status Achieved    Target Date 01/30/20             PT Long Term Goals - 01/16/20 1806      PT LONG TERM GOAL #1   Title Be independent with a long-term home exercise program for self-management of symptoms.    Baseline Initial HEP provided at IE (01/16/2020);    Time 12    Period Weeks    Status New   TARGET DATE FOR ALL LONG TERM GOALS: 04/09/2020     PT LONG TERM GOAL #2   Title Demonstrate improved FOTO score by 10 or more points to demonstrate improvement in overall condition and self-reported functional ability.    Baseline test at visit 2 (01/16/2020);    Time 12    Period Weeks    Status New      PT LONG TERM GOAL #3   Title Improve core strength to hold 45 degree unsupported reclined position for 60 seconds and modified plank for 60 seconds for improved core strength to allow patient to complete valued functional tasks such as lifting, bending, sitting with less difficulty.    Baseline 50 seconds with difficulty holding position steady (01/16/2020);    Time 12    Period Weeks    Status New      PT LONG TERM GOAL #4   Title Patient will demonstrate B  hip strength 4+/5 to demonstrate functional strength for improved lifting, carrying, and bending.    Baseline see objective exam (01/16/2020);    Time 12    Period Weeks    Status New    Target Date 04/09/20      PT LONG TERM GOAL #5   Title Complete community, work and/or recreational activities without limitation due to current condition.    Baseline when her symptoms flair it is very difficult to work, it makes ADLs, and IADLs very hard. It disrupts her concentration which is instrumental to her work.  She is fearful it may cripple her for a day or a few days and she would not be able to work. (01/16/2020);    Time 12    Period Weeks    Status New                 Plan - 01/27/20 1819    Clinical Impression Statement Patient tolerated treatment well overall and continues to advance in her exercise tolerance and motor control. She continues to demontrate hip and core weakness that likely impacts her ability to perform funcitonal activities without pain. Patient would benefit from continued management of limiting condition by skilled physical therapist to address remaining impairments and functional limitations to work towards stated goals and return to PLOF or maximal functional independence.    Personal Factors and Comorbidities Age;Behavior Pattern;Comorbidity 3+    Comorbidities Relevant past medical history and comorbidities include migraines and neck pain, major depressive disorder (patient feels well managed at this point), skin cancer spot removed recently, Hx GI discomfort/problems.    Examination-Activity Limitations Bed Mobility;Bend;Locomotion Level;Stand;Lift;Hygiene/Grooming;Squat;Bathing;Caring for Others;Carry;Sit;Transfers    Examination-Participation Restrictions Occupation;Driving;Community Activity;Cleaning;Laundry;Shop    Stability/Clinical Decision Making Stable/Uncomplicated    Rehab Potential Good    PT Frequency 2x / week    PT Duration 12 weeks    PT  Treatment/Interventions ADLs/Self Care Home Management;Moist Heat;Cryotherapy;Therapeutic activities;Therapeutic exercise;Neuromuscular re-education;Patient/family education;Manual techniques;Passive range of motion;Dry needling;Spinal Manipulations;Joint Manipulations    PT Next Visit Plan strengthening    PT Home Exercise Plan Medbridge Access Code: [email or web address]    Consulted and Agree with Plan of Care Patient           Patient will benefit from skilled therapeutic intervention in order to improve the following deficits and impairments:  Pain, Increased muscle spasms, Hypermobility, Decreased coordination, Decreased activity tolerance, Decreased endurance, Decreased range of motion, Decreased strength, Hypomobility, Impaired perceived functional ability, Impaired flexibility  Visit Diagnosis: Bilateral low back pain without sciatica, unspecified chronicity  Muscle weakness (generalized)     Problem List Patient Active Problem List   Diagnosis Date Noted  . MDD (major depressive disorder), recurrent, in full remission ([place]) 10/03/2019  . MDD (major depressive disorder), recurrent, in partial remission ([place]) 02/11/2019  . Binge eating disorder 02/11/2019  . Vitamin D deficiency 08/14/2018  . Intractable chronic migraine without aura and without status migrainosus 03/04/2018  . Postmenopausal 02/15/2018  . Pure hypercholesterolemia 02/15/2018  . Unintended weight gain 02/15/2018   Everlean Alstrom. Graylon Good, PT, DPT 01/27/20, 6:28 PM  [facility] [address] [place], [address] Phone: [phone]   Fax:  [phone]  Name: Barbara Russo MRN: [id number] Date of Birth: January 01, 1961

## 2020-01-28 ENCOUNTER — Ambulatory Visit: Payer: 59 | Admitting: Physical Therapy

## 2020-01-29 ENCOUNTER — Other Ambulatory Visit: Payer: Self-pay

## 2020-01-29 ENCOUNTER — Ambulatory Visit: Payer: 59 | Admitting: Physical Therapy

## 2020-01-29 DIAGNOSIS — M545 Low back pain, unspecified: Secondary | ICD-10-CM

## 2020-01-29 DIAGNOSIS — M6281 Muscle weakness (generalized): Secondary | ICD-10-CM

## 2020-01-29 NOTE — Therapy (Signed)
[facility] [address], [place], [address] Phone: [phone]   Fax:  [phone]  Physical Therapy Treatment  Patient Details  Name: Barbara Russo MRN: [id number] Date of Birth: 07-12-1960 Referring Provider (PT): Allene Dillon, NP   Encounter Date: 01/29/2020   PT End of Session - 01/29/20 1809    Visit Number 5    Number of Visits 24    Date for PT Re-Evaluation 04/09/20    PT Start Time 1638    PT Stop Time 4665    PT Time Calculation (min) 30 min    Activity Tolerance Patient tolerated treatment well;No increased pain    Behavior During Therapy WFL for tasks assessed/performed           Past Medical History:  Diagnosis Date  . Anxiety   . Depression   . Dysplastic nevus 10/03/2019   L lower back paraspinal mod to severe (shave removal)  . Squamous cell carcinoma of skin 09/26/2019   SCCIS - L prox lat pretibial     Past Surgical History:  Procedure Laterality Date  . BREAST BIOPSY Left 2008   neg    There were no vitals filed for this visit.   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 She did not feel achy after last session. She was hoping to have made more progress at this point but also has been worried about the exercises causing a flair up (which they haven't). She has not been able to get to her HEP since last session. She feels a bit stiff although she has been moving a lot the last hour of work.    Pertinent History Patient is a 59 y.o. female who presents to outpatient physical therapy with a referral for medical diagnosis lumbar DDD (degenerative disc disease), back muscle spasm. This patient's chief complaints consist of chronic low back pain with increasing episodes of severe pain, leading to the following functional  deficits: when her symptoms flair it is very difficult to work, it makes ADLs, and IADLs very hard. It disrupts her concentration which is instrumental to her work. She is fearful it may cripple her for a day or a few days and she would not be able to work.  Relevant past medical history and comorbidities include migraines and neck pain, major depressive disorder (patient feels well managed at this point), skin cancer spot removed recently, Hx GI discomfort/problems. Patient denies hx stroke, seizures, lung problem, major cardiac events, diabetes, changes in bowel or bladder problems, new onset stumbling or dropping things.    Limitations --   when her symptoms flair it is very difficult to work, it makes ADLs, and IADLs very hard. It disrupts her concentration which is instrumental to her work. She is fearful it may cripple her for a day or a few days and she would not be able to work.   Diagnostic tests CT abdomen report 08/09/2019: "IMPRESSION:1. Distal colonic diverticulosis without diverticulitis.2. Mild fatty infiltration of the liver.3. No acute intra-abdominal or intrapelvic process. No explanation for weight loss."    Patient Stated Goals would like to make the back feel better and not have these episodes    Currently in Pain? No/denies    Pain Onset More than a month ago  TREATMENT:  Therapeutic exercise:to centralize symptoms and improve ROM, strength, muscular endurance, and activity tolerance required for successful completion of functional activities. - hooklying low trunk rotation x 4 min - hooklying bridge x2 min - sidelying clamshell with yellow theraband around knees3x10 each side, withself palpation to prevent excessive trunk rotation and isolate hip external rotators.  Practice swinging golf club, chipper and driver several reps in gym to help feel more confident and allow introduce the movement to her back after not performing it for several years.   HOME  EXERCISE PROGRAM Access Code: [email or web address] URL: [email or web address] Date: 01/21/2020 Prepared by: Rosita Kea  Exercises Beginner Bridge - 1 x daily - 2 sets - 15 reps - 1 second hold Clam - 1 x daily - 2 sets - 15 reps - 1 second hold Supine Dead Bug with Leg Extension - 1 x daily - 3 sets - 10 reps - 1 second hold     PT Education - 01/29/20 1809    Education Details Exercise purpose/form. Self management techniques.    Person(s) Educated Patient    Methods Explanation;Demonstration;Verbal cues    Comprehension Verbalized understanding;Returned demonstration;Verbal cues required;Need further instruction            PT Short Term Goals - 01/21/20 2049      PT SHORT TERM GOAL #1   Title Be independent with initial home exercise program for self-management of symptoms.    Baseline Initial HEP provided at IE (01/16/2020);    Time 2    Period Weeks    Status Achieved    Target Date 01/30/20             PT Long Term Goals - 01/16/20 1806      PT LONG TERM GOAL #1   Title Be independent with a long-term home exercise program for self-management of symptoms.    Baseline Initial HEP provided at IE (01/16/2020);    Time 12    Period Weeks    Status New   TARGET DATE FOR ALL LONG TERM GOALS: 04/09/2020     PT LONG TERM GOAL #2   Title Demonstrate improved FOTO score by 10 or more points to demonstrate improvement in overall condition and self-reported functional ability.    Baseline test at visit 2 (01/16/2020);    Time 12    Period Weeks    Status New      PT LONG TERM GOAL #3   Title Improve core strength to hold 45 degree unsupported reclined position for 60 seconds and modified plank for 60 seconds for improved core strength to allow patient to complete valued functional tasks such as lifting, bending, sitting with less difficulty.    Baseline 50 seconds with difficulty holding position steady (01/16/2020);    Time 12    Period Weeks    Status New      PT  LONG TERM GOAL #4   Title Patient will demonstrate B hip strength 4+/5 to demonstrate functional strength for improved lifting, carrying, and bending.    Baseline see objective exam (01/16/2020);    Time 12    Period Weeks    Status New    Target Date 04/09/20      PT LONG TERM GOAL #5   Title Complete community, work and/or recreational activities without limitation due to current condition.    Baseline when her symptoms flair it is very difficult to work, it makes ADLs, and IADLs very hard. It disrupts her concentration which is instrumental  to her work. She is fearful it may cripple her for a day or a few days and she would not be able to work. (01/16/2020);    Time 12    Period Weeks    Status New                 Plan - 01/29/20 1927    Clinical Impression Statement Patient tolerated treatment well overall with no increase in pain. Less exercise performed this session due to pt being 15 min late. Patient appears to be making good progress at this point. Went over some strategies to help protect her back during her planned weekend outing. Patient would benefit from continued management of limiting condition by skilled physical therapist to address remaining impairments and functional limitations to work towards stated goals and return to PLOF or maximal functional independence.    Personal Factors and Comorbidities Age;Behavior Pattern;Comorbidity 3+    Comorbidities Relevant past medical history and comorbidities include migraines and neck pain, major depressive disorder (patient feels well managed at this point), skin cancer spot removed recently, Hx GI discomfort/problems.    Examination-Activity Limitations Bed Mobility;Bend;Locomotion Level;Stand;Lift;Hygiene/Grooming;Squat;Bathing;Caring for Others;Carry;Sit;Transfers    Examination-Participation Restrictions Occupation;Driving;Community Activity;Cleaning;Laundry;Shop    Stability/Clinical Decision Making Stable/Uncomplicated     Rehab Potential Good    PT Frequency 2x / week    PT Duration 12 weeks    PT Treatment/Interventions ADLs/Self Care Home Management;Moist Heat;Cryotherapy;Therapeutic activities;Therapeutic exercise;Neuromuscular re-education;Patient/family education;Manual techniques;Passive range of motion;Dry needling;Spinal Manipulations;Joint Manipulations    PT Next Visit Plan strengthening    PT Home Exercise Plan Medbridge Access Code: [email or web address]    Consulted and Agree with Plan of Care Patient           Patient will benefit from skilled therapeutic intervention in order to improve the following deficits and impairments:  Pain, Increased muscle spasms, Hypermobility, Decreased coordination, Decreased activity tolerance, Decreased endurance, Decreased range of motion, Decreased strength, Hypomobility, Impaired perceived functional ability, Impaired flexibility  Visit Diagnosis: Bilateral low back pain without sciatica, unspecified chronicity  Muscle weakness (generalized)     Problem List Patient Active Problem List   Diagnosis Date Noted  . MDD (major depressive disorder), recurrent, in full remission ([place]) 10/03/2019  . MDD (major depressive disorder), recurrent, in partial remission ([place]) 02/11/2019  . Binge eating disorder 02/11/2019  . Vitamin D deficiency 08/14/2018  . Intractable chronic migraine without aura and without status migrainosus 03/04/2018  . Postmenopausal 02/15/2018  . Pure hypercholesterolemia 02/15/2018  . Unintended weight gain 02/15/2018    Everlean Alstrom. Graylon Good, PT, DPT 01/29/20, 7:27 PM  [facility] [address], [place], [address] Phone: [phone]   Fax:  [phone]  Name: Sada Mazzoni MRN: [id number] Date of Birth: July 31, 1960

## 2020-01-30 ENCOUNTER — Encounter: Payer: 59 | Admitting: Physical Therapy

## 2020-02-04 ENCOUNTER — Ambulatory Visit: Payer: 59 | Admitting: Physical Therapy

## 2020-02-04 ENCOUNTER — Encounter: Payer: 59 | Admitting: Physical Therapy

## 2020-02-06 ENCOUNTER — Ambulatory Visit: Payer: 59 | Admitting: Physical Therapy

## 2020-02-06 ENCOUNTER — Encounter: Payer: 59 | Admitting: Physical Therapy

## 2020-02-06 ENCOUNTER — Other Ambulatory Visit: Payer: Self-pay

## 2020-02-10 ENCOUNTER — Ambulatory Visit: Payer: 59 | Admitting: Physical Therapy

## 2020-02-11 ENCOUNTER — Encounter: Payer: 59 | Admitting: Physical Therapy

## 2020-02-13 ENCOUNTER — Encounter: Payer: 59 | Admitting: Physical Therapy

## 2020-02-13 ENCOUNTER — Ambulatory Visit: Payer: 59 | Admitting: Physical Therapy

## 2020-02-19 ENCOUNTER — Ambulatory Visit: Payer: 59 | Admitting: Physical Therapy

## 2020-02-24 ENCOUNTER — Telehealth: Payer: Self-pay | Admitting: Physical Therapy

## 2020-02-24 ENCOUNTER — Ambulatory Visit: Payer: 59 | Attending: Family Medicine | Admitting: Physical Therapy

## 2020-02-24 DIAGNOSIS — M545 Low back pain: Secondary | ICD-10-CM | POA: Insufficient documentation

## 2020-02-24 DIAGNOSIS — M6281 Muscle weakness (generalized): Secondary | ICD-10-CM | POA: Insufficient documentation

## 2020-02-24 NOTE — Telephone Encounter (Signed)
Called patient after she did not come to her 4pm appointment today. Patient answered and said she did not see the appointment on the  mychart so she thought it was canceled by the front desk since she had called earlier. She had a pretty bad fall ~ days ago that she is recovering from and also had to quarantined for possible COVID 19 exposure recently. She decided to cancel her next appointment due to still being sore from the the fall. Confirmed her next scheduled appointment Tuesday 03/03/2020 at 4/15pm.   Clarise Cruz R. Graylon Good, PT, DPT 02/24/20, 4:54 PM

## 2020-02-27 ENCOUNTER — Ambulatory Visit: Payer: 59 | Admitting: Physical Therapy

## 2020-03-03 ENCOUNTER — Ambulatory Visit: Payer: 59 | Admitting: Physical Therapy

## 2020-03-05 ENCOUNTER — Ambulatory Visit: Payer: 59 | Admitting: Physical Therapy

## 2020-03-05 ENCOUNTER — Encounter: Payer: Self-pay | Admitting: Psychiatry

## 2020-03-05 ENCOUNTER — Other Ambulatory Visit: Payer: Self-pay

## 2020-03-05 ENCOUNTER — Encounter: Payer: Self-pay | Admitting: Physical Therapy

## 2020-03-05 ENCOUNTER — Telehealth (INDEPENDENT_AMBULATORY_CARE_PROVIDER_SITE_OTHER): Payer: 59 | Admitting: Psychiatry

## 2020-03-05 DIAGNOSIS — F5081 Binge eating disorder: Secondary | ICD-10-CM

## 2020-03-05 DIAGNOSIS — F3342 Major depressive disorder, recurrent, in full remission: Secondary | ICD-10-CM | POA: Diagnosis not present

## 2020-03-05 DIAGNOSIS — M6281 Muscle weakness (generalized): Secondary | ICD-10-CM

## 2020-03-05 DIAGNOSIS — M545 Low back pain, unspecified: Secondary | ICD-10-CM

## 2020-03-05 MED ORDER — BUPROPION HCL ER (XL) 300 MG PO TB24: 300.0000 mg | ORAL_TABLET | Freq: Every day | ORAL | 1 refills | Status: DC

## 2020-03-05 NOTE — Therapy (Signed)
[facility] [address], [place], [address] Phone: [phone]   Fax:  [phone]  Physical Therapy Treatment  Patient Details  Name: Barbara Russo MRN: [id number] Date of Birth: 1961-03-26 Referring Provider (PT): Allene Dillon, NP   Encounter Date: 03/05/2020   PT End of Session - 03/05/20 1924    Visit Number 6    Number of Visits 24    Date for PT Re-Evaluation 04/09/20    PT Start Time 1062    PT Stop Time 6948    PT Time Calculation (min) 40 min    Activity Tolerance Patient tolerated treatment well;No increased pain    Behavior During Therapy WFL for tasks assessed/performed           Past Medical History:  Diagnosis Date  . Anxiety   . Depression   . Dysplastic nevus 10/03/2019   L lower back paraspinal mod to severe (shave removal)  . Squamous cell carcinoma of skin 09/26/2019   SCCIS - L prox lat pretibial     Past Surgical History:  Procedure Laterality Date  . BREAST BIOPSY Left 2008   neg    There were no vitals filed for this visit.   Subjective Assessment - 03/05/20 1736    Subjective It has been several weeks since patient has been here because she fell in the right breast. She got up in the middle of the night to use the restroom. She was half asleep and didn't realize her foot was caught on the laptop wire and the cat was on the floor where she is not usually and she had to step on her her. She had to lie there for a while and it didn't hurt that much the next day. It started to really hurt the day after. she also had to [place] for COVID 19. It was hurting a lot under her right breast on the right side, her right shoulder was sore and weak. She saw her PCP Tuesday morning who took radiographs (pt does not know results) and put her on predgnisone which she started Tuesday afternoon. Her back "went out" that day and she was in agony with no apparent reason. Yesterday, she  started to feel much better in the back and the right side and she can carry her bag on the right arm now. It still feels weak to lift her R arm. Back of right shoulder is where pain is down and right ribs. Has a feeling that she has to hold her R rib cage when she rolls to the left side. Has 2/10 pain at right sided chest currently at rest sitting. Back is feeling okay today. patient really enjoyed her golf event.    Pertinent History Patient is a 59 y.o. female who presents to outpatient physical therapy with a referral for medical diagnosis lumbar DDD (degenerative disc disease), back muscle spasm. This patient's chief complaints consist of chronic low back pain with increasing episodes of severe pain, leading to the following functional deficits: when her symptoms flair it is very difficult to work, it makes ADLs, and IADLs very hard. It disrupts her concentration which is instrumental to her work. She is fearful it may cripple her for a day or a few days and she would not be able to work.  Relevant past medical history and comorbidities include migraines and neck pain, major depressive disorder (patient feels well managed at this point), skin cancer spot removed recently, Hx GI  discomfort/problems. Patient denies hx stroke, seizures, lung problem, major cardiac events, diabetes, changes in bowel or bladder problems, new onset stumbling or dropping things.    Limitations --   when her symptoms flair it is very difficult to work, it makes ADLs, and IADLs very hard. It disrupts her concentration which is instrumental to her work. She is fearful it may cripple her for a day or a few days and she would not be able to work.   Diagnostic tests CT abdomen report 08/09/2019: "IMPRESSION:1. Distal colonic diverticulosis without diverticulitis.2. Mild fatty infiltration of the liver.3. No acute intra-abdominal or intrapelvic process. No explanation for weight loss."    Patient Stated Goals would like to make the back  feel better and not have these episodes    Currently in Pain? Yes    Pain Score 2     Pain Onset More than a month ago          OBJECTIVE  R shoulder AROM: abduction to about ~140 degrees before pain at axilla stops her.     TREATMENT:  Therapeutic exercise:to centralize symptoms and improve ROM, strength, muscular endurance, and activity tolerance required for successful completion of functional activities. - screen of R shoulder (see above) - hooklying low trunk rotation x 3 min - abdominal brace with marching, 2x20 each side. - hooklying clam shell with green band, 2x10 each side keeping contralateral side still for isometric contraction with abdominal brace to stablize pelvis.  - supine 90/90 Alternating Toe Touch with abdominal brace 2x10 each side - Prone hip extension with pillow under hips and knee flexed to preferentially bias gluteus maximus. 2x10 Cuing to keep knee flexed.  - Education on HEP including handout   Pt required multimodal cuing for proper technique and to facilitate improved neuromuscular control, strength, range of motion, and functional ability resulting in improved performance and form.  HOME EXERCISE PROGRAM Access Code: [email or web address] URL: [email or web address] Date: 03/05/2020 Prepared by: Rosita Kea  Exercises Hooklying Isometric Clamshell - 2-3 sets - 10 reps - green band Supine 90/90 Alternating Toe Touch - 2-3 sets - 10 reps Prone Hip Extension with Bent Knee - One Pillow - 2-3 sets - 10 reps    PT Education - 03/05/20 1754    Education Details Exercise purpose/form. Self management techniques.    Person(s) Educated Patient    Methods Explanation;Demonstration;Tactile cues;Verbal cues    Comprehension Verbalized understanding;Returned demonstration;Verbal cues required;Tactile cues required;Need further instruction            PT Short Term Goals - 01/21/20 2049      PT SHORT TERM GOAL #1   Title Be independent with  initial home exercise program for self-management of symptoms.    Baseline Initial HEP provided at IE (01/16/2020);    Time 2    Period Weeks    Status Achieved    Target Date 01/30/20             PT Long Term Goals - 01/16/20 1806      PT LONG TERM GOAL #1   Title Be independent with a long-term home exercise program for self-management of symptoms.    Baseline Initial HEP provided at IE (01/16/2020);    Time 12    Period Weeks    Status New   TARGET DATE FOR ALL LONG TERM GOALS: 04/09/2020     PT LONG TERM GOAL #2   Title Demonstrate improved FOTO score by 10 or more points to demonstrate improvement in overall  condition and self-reported functional ability.    Baseline test at visit 2 (01/16/2020);    Time 12    Period Weeks    Status New      PT LONG TERM GOAL #3   Title Improve core strength to hold 45 degree unsupported reclined position for 60 seconds and modified plank for 60 seconds for improved core strength to allow patient to complete valued functional tasks such as lifting, bending, sitting with less difficulty.    Baseline 50 seconds with difficulty holding position steady (01/16/2020);    Time 12    Period Weeks    Status New      PT LONG TERM GOAL #4   Title Patient will demonstrate B hip strength 4+/5 to demonstrate functional strength for improved lifting, carrying, and bending.    Baseline see objective exam (01/16/2020);    Time 12    Period Weeks    Status New    Target Date 04/09/20      PT LONG TERM GOAL #5   Title Complete community, work and/or recreational activities without limitation due to current condition.    Baseline when her symptoms flair it is very difficult to work, it makes ADLs, and IADLs very hard. It disrupts her concentration which is instrumental to her work. She is fearful it may cripple her for a day or a few days and she would not be able to work. (01/16/2020);    Time 12    Period Weeks    Status New                 Plan -  03/05/20 1930    Clinical Impression Statement Patient is returning after missisng PT due to increased pain and disability following fall that affected R shoulder and ribcage. Modified exercises today to accommodate discomfort there which is currently being managed by PCP. Did screen R shoulder and it was painful but no evidence of massive RTC tear. Is currently feeling better in lumbar spine and injury site due to prednisone dosepak. Updated HEP for exercise that are tolerable at the moment. Patient would benefit from continued management of limiting condition by skilled physical therapist to address remaining impairments and functional limitations to work towards stated goals and return to PLOF or maximal functional independence.    Personal Factors and Comorbidities Age;Behavior Pattern;Comorbidity 3+    Comorbidities Relevant past medical history and comorbidities include migraines and neck pain, major depressive disorder (patient feels well managed at this point), skin cancer spot removed recently, Hx GI discomfort/problems.    Examination-Activity Limitations Bed Mobility;Bend;Locomotion Level;Stand;Lift;Hygiene/Grooming;Squat;Bathing;Caring for Others;Carry;Sit;Transfers    Examination-Participation Restrictions Occupation;Driving;Community Activity;Cleaning;Laundry;Shop    Stability/Clinical Decision Making Stable/Uncomplicated    Rehab Potential Good    PT Frequency 2x / week    PT Duration 12 weeks    PT Treatment/Interventions ADLs/Self Care Home Management;Moist Heat;Cryotherapy;Therapeutic activities;Therapeutic exercise;Neuromuscular re-education;Patient/family education;Manual techniques;Passive range of motion;Dry needling;Spinal Manipulations;Joint Manipulations    PT Next Visit Plan strengthening    PT Home Exercise Plan Medbridge Access Code: [email or web address]    Consulted and Agree with Plan of Care Patient           Patient will benefit from skilled therapeutic intervention in order to  improve the following deficits and impairments:  Pain, Increased muscle spasms, Hypermobility, Decreased coordination, Decreased activity tolerance, Decreased endurance, Decreased range of motion, Decreased strength, Hypomobility, Impaired perceived functional ability, Impaired flexibility  Visit Diagnosis: Bilateral low back pain without sciatica, unspecified chronicity  Muscle weakness (  generalized)     Problem List Patient Active Problem List   Diagnosis Date Noted  . MDD (major depressive disorder), recurrent, in full remission ([place]) 10/03/2019  . MDD (major depressive disorder), recurrent, in partial remission ([place]) 02/11/2019  . Binge eating disorder 02/11/2019  . Vitamin D deficiency 08/14/2018  . Intractable chronic migraine without aura and without status migrainosus 03/04/2018  . Postmenopausal 02/15/2018  . Pure hypercholesterolemia 02/15/2018  . Unintended weight gain 02/15/2018    Everlean Alstrom. Graylon Good, PT, DPT 03/05/20, 7:30 PM  [facility] [address] [place], [address] Phone: [phone]   Fax:  [phone]  Name: Nichoel Digiulio MRN: [id number] Date of Birth: 10/29/1960

## 2020-03-05 NOTE — Progress Notes (Signed)
Provider Location : [facility] Patient Location : Home  Participants: Patient , Provider  Virtual Visit via Video Note  I connected with Barbara Russo on 03/05/20 at  4:20 PM EDT by a video enabled telemedicine application and verified that I am speaking with the correct person using two identifiers.   I discussed the limitations of evaluation and management by telemedicine and the availability of in person appointments. The patient expressed understanding and agreed to proceed.    I discussed the assessment and treatment plan with the patient. The patient was provided an opportunity to ask questions and all were answered. The patient agreed with the plan and demonstrated an understanding of the instructions.   The patient was advised to call back or seek an in-person evaluation if the symptoms worsen or if the condition fails to improve as anticipated.   [place] [facility] Barbara Russo OP Progress Note  03/05/2020 4:38 PM Barbara Russo  MRN:  [id number]  Chief Complaint:  Chief Complaint    Follow-up     HPI: Barbara Russo is a 59 year old Caucasian female, single, employed, lives in [place], has a history of MDD, binge eating disorder, headaches, hyperlipidemia, was evaluated by telemedicine today.  Patient today reports she is currently stable on current medication regimen.  She denies any significant anxiety or depressive symptoms.  She reports she continues to enjoy her work.  She reports she does struggle with her brothers alcoholism however she is trying to separate herself from it.  She does talk to other family members and that has been beneficial.  Patient denies any suicidality, homicidality or perceptual disturbances.  She is compliant on the Wellbutrin and the BuSpar.  Denies side effects.  She denies any other concerns today.  Visit Diagnosis:    ICD-10-CM   1. MDD (major depressive disorder), recurrent, in full remission ([place])  F33.42 buPROPion (WELLBUTRIN XL) 300 MG 24 hr  tablet  2. Binge eating disorder  F50.81     Past Psychiatric History: I have reviewed past psychiatric history from my progress note on 01/30/2018.  Past trials of Valium, Wellbutrin, BuSpar, Ritalin, Depakote, Vyvanse  Past Medical History:  Past Medical History:  Diagnosis Date  . Anxiety   . Depression   . Dysplastic nevus 10/03/2019   L lower back paraspinal mod to severe (shave removal)  . Squamous cell carcinoma of skin 09/26/2019   SCCIS - L prox lat pretibial     Past Surgical History:  Procedure Laterality Date  . BREAST BIOPSY Left 2008   neg    Family Psychiatric History: I have reviewed family psychiatric history from my progress note on 01/30/2018.  Family History:  Family History  Adopted: Yes    Social History: I have reviewed social history from my progress note on 01/30/2018. Social History   Socioeconomic History  . Marital status: Single    Spouse name: Not on file  . Number of children: 0  . Years of education: Not on file  . Highest education level: Bachelor's degree (e.g., BA, AB, BS)  Occupational History  . Not on file  Tobacco Use  . Smoking status: Never Smoker  . Smokeless tobacco: Never Used  Vaping Use  . Vaping Use: Never used  Substance and Sexual Activity  . Alcohol use: Yes    Alcohol/week: 2.0 standard drinks    Types: 2 Cans of beer per week  . Drug use: Never  . Sexual activity: Not Currently  Other Topics Concern  . Not on file  Social  History Narrative  . Not on file   Social Determinants of Health   Financial Resource Strain:   . Difficulty of Paying Living Expenses: Not on file  Food Insecurity:   . Worried About Charity fundraiser in the Last Year: Not on file  . Ran Out of Food in the Last Year: Not on file  Transportation Needs:   . Lack of Transportation (Medical): Not on file  . Lack of Transportation (Non-Medical): Not on file  Physical Activity:   . Days of Exercise per Week: Not on file  . Minutes of  Exercise per Session: Not on file  Stress:   . Feeling of Stress : Not on file  Social Connections:   . Frequency of Communication with Friends and Family: Not on file  . Frequency of Social Gatherings with Friends and Family: Not on file  . Attends Religious Services: Not on file  . Active Member of Clubs or Organizations: Not on file  . Attends Archivist Meetings: Not on file  . Marital Status: Not on file    Allergies: No Known Allergies  Metabolic Disorder Labs: No results found for: HGBA1C, MPG No results found for: PROLACTIN No results found for: CHOL, TRIG, HDL, CHOLHDL, VLDL, LDLCALC No results found for: TSH  Therapeutic Level Labs: No results found for: LITHIUM No results found for: VALPROATE No components found for:  CBMZ  Current Medications: Current Outpatient Medications  Medication Sig Dispense Refill  . predniSONE (DELTASONE) 10 MG tablet 6 day taper - Take as directed    . traMADol (ULTRAM) 50 MG tablet Take by mouth.    Marland Kitchen buPROPion (WELLBUTRIN XL) 300 MG 24 hr tablet Take 1 tablet (300 mg total) by mouth daily. 90 tablet 1  . busPIRone (BUSPAR) 15 MG tablet Take 1 tablet (15 mg total) by mouth 3 (three) times daily. 270 tablet 0  . Butalbital-APAP-Caffeine 50-325-40 MG capsule Take by mouth.    . Calcium Carbonate-Vitamin D (CALCIUM HIGH POTENCY/VITAMIN D) 600-200 MG-UNIT TABS Take 600 mg by mouth.    . cetirizine (ZYRTEC) 10 MG tablet Take by mouth.    . Coenzyme Q10 10 MG capsule Take by mouth.    Marland Kitchen COVID-19 Specimen Collection KIT See admin instructions. for testing    . [employer] PURELAX 17 GM/SCOOP powder TAKE AS DIRECTED FOR COLONIC PREP.    Marland Kitchen divalproex (DEPAKOTE) 250 MG DR tablet Take by mouth.    . gabapentin (NEURONTIN) 400 MG capsule Take 400 mg by mouth 3 (three) times daily.    . mupirocin ointment (BACTROBAN) 2 % Apply 1 application topically daily. With dressing changes 22 g 0  . naratriptan (AMERGE) 2.5 MG tablet Take 2.5 mg by mouth  daily as needed. (Patient not taking: Reported on 01/16/2020)    . polyethylene glycol powder (GLYCOLAX/MIRALAX) 17 GM/SCOOP powder Take as directed for colonic prep. (Patient not taking: Reported on 01/16/2020)    . predniSONE (DELTASONE) 10 MG tablet Take by mouth.    . prochlorperazine (COMPAZINE) 5 MG tablet Take 5 mg by mouth every 6 (six) hours as needed.    . Rimegepant Sulfate 75 MG TBDP Take by mouth.    . simvastatin (ZOCOR) 20 MG tablet Take 20 mg by mouth daily. (Patient not taking: Reported on 01/16/2020)    . topiramate (TOPAMAX) 100 MG tablet Take by mouth.    . traMADol (ULTRAM) 50 MG tablet Take by mouth.    . zolmitriptan (ZOMIG-ZMT) 5 MG disintegrating tablet  Take by mouth. (Patient not taking: Reported on 01/16/2020)     No current facility-administered medications for this visit.     Musculoskeletal: Strength & Muscle Tone: UTA Gait & Station: normal Patient leans: N/A  Psychiatric Specialty Exam: Review of Systems  Psychiatric/Behavioral: Negative for agitation, behavioral problems, confusion, decreased concentration, dysphoric mood, hallucinations, self-injury, sleep disturbance and suicidal ideas. The patient is not nervous/anxious and is not hyperactive.   All other systems reviewed and are negative.   There were no vitals taken for this visit.There is no height or weight on file to calculate BMI.  General Appearance: Casual  Eye Contact:  Fair  Speech:  Clear and Coherent  Volume:  Normal  Mood:  Euthymic  Affect:  Congruent  Thought Process:  Goal Directed and Descriptions of Associations: Intact  Orientation:  Full (Time, Place, and Person)  Thought Content: Logical   Suicidal Thoughts:  No  Homicidal Thoughts:  No  Memory:  Immediate;   Fair Recent;   Fair Remote;   Fair  Judgement:  Fair  Insight:  Fair  Psychomotor Activity:  Normal  Concentration:  Concentration: Fair and Attention Span: Fair  Recall:  [employer] of Knowledge: Fair  Language: Fair   Akathisia:  No  Handed:  Right  AIMS (if indicated): UTA  Assets:  Communication Skills Desire for Improvement Housing Social Support  ADL's:  Intact  Cognition: WNL  Sleep:  Fair   Screenings: PHQ2-9     Office Visit from 02/11/2019 in [facility]  PHQ-2 Total Score 1       Assessment and Plan: Barbara Russo is a 59 year old Caucasian female, single, lives in [place], has a history of depression, binge eating disorder was evaluated by telemedicine today.  Patient is currently stable on current medication regimen.  Plan as noted below.  Plan MDD in remission Wellbutrin XL 300 mg p.o. daily BuSpar 15 mg p.o. twice daily  Binge eating disorder in remission We will monitor closely  Follow-up in clinic in 2 to 3 months or sooner if needed.  I have spent atleast 20 minutes face to face with patient today. More than 50 % of the time was spent for preparing to see the patient ( e.g., review of test, records ), ordering medications and test ,psychoeducation and supportive psychotherapy and care coordination,as well as documenting clinical information in electronic health record. This note was generated in part or whole with voice recognition software. Voice recognition is usually quite accurate but there are transcription errors that can and very often do occur. I apologize for any typographical errors that were not detected and corrected.        Barbara Alert, Barbara Russo 03/05/2020, 4:38 PM

## 2020-03-09 ENCOUNTER — Ambulatory Visit: Payer: 59 | Admitting: Physical Therapy

## 2020-03-09 ENCOUNTER — Other Ambulatory Visit: Payer: Self-pay

## 2020-03-09 DIAGNOSIS — M545 Low back pain, unspecified: Secondary | ICD-10-CM

## 2020-03-09 DIAGNOSIS — M6281 Muscle weakness (generalized): Secondary | ICD-10-CM

## 2020-03-09 NOTE — Therapy (Signed)
[facility] [address] [place], [address] Phone: [phone]   Fax:  [phone]  Physical Therapy Treatment  Patient Details  Name: Barbara Russo MRN: [id number] Date of Birth: 1960/11/18 Referring Provider (PT): Allene Dillon, NP   Encounter Date: 03/09/2020   PT End of Session - 03/09/20 1738    Visit Number 7    Number of Visits 24    Date for PT Re-Evaluation 04/09/20    PT Start Time 1620    PT Stop Time 1705    PT Time Calculation (min) 45 min    Activity Tolerance Patient tolerated treatment well    Behavior During Therapy [facility] for tasks assessed/performed           Past Medical History:  Diagnosis Date  . Anxiety   . Depression   . Dysplastic nevus 10/03/2019   L lower back paraspinal mod to severe (shave removal)  . Squamous cell carcinoma of skin 09/26/2019   SCCIS - L prox lat pretibial     Past Surgical History:  Procedure Laterality Date  . BREAST BIOPSY Left 2008   neg    There were no vitals filed for this visit.   Subjective Assessment - 03/09/20 1627    Subjective Patient reports her back is "out" and it started last night. Agg: sititng in bed, sitting on coutch, standing, getting out of the car, twisting, bending, lifting, going from sit <> stand, rolling in the bed, moving the back, Pain is in the low back on the left side more than the right. This is not as bad as it gets. Ease: sitting in a straight chair is okay, takes muscle relaxants at night (unsure if it helps), tramadol (unsure if it helped). She has been doing her HEP daily. Walking is not terrible (has been very active today).    Pertinent History Patient is a 59 y.o. female who presents to outpatient physical therapy with a referral for medical diagnosis lumbar DDD (degenerative disc disease), back muscle spasm. This patient's chief complaints consist of chronic low back pain with increasing episodes of severe  pain, leading to the following functional deficits: when her symptoms flair it is very difficult to work, it makes ADLs, and IADLs very hard. It disrupts her concentration which is instrumental to her work. She is fearful it may cripple her for a day or a few days and she would not be able to work.  Relevant past medical history and comorbidities include migraines and neck pain, major depressive disorder (patient feels well managed at this point), skin cancer spot removed recently, Hx GI discomfort/problems. Patient denies hx stroke, seizures, lung problem, major cardiac events, diabetes, changes in bowel or bladder problems, new onset stumbling or dropping things.    Limitations --   when her symptoms flair it is very difficult to work, it makes ADLs, and IADLs very hard. It disrupts her concentration which is instrumental to her work. She is fearful it may cripple her for a day or a few days and she would not be able to work.   Diagnostic tests CT abdomen report 08/09/2019: "IMPRESSION:1. Distal colonic diverticulosis without diverticulitis.2. Mild fatty infiltration of the liver.3. No acute intra-abdominal or intrapelvic process. No explanation for weight loss."    Patient Stated Goals would like to make the back feel better and not have these episodes    Currently in Pain? Yes    Pain Score 6  Pain Location Back    Pain Orientation Left;Lower    Pain Descriptors / Indicators --   constant ache, stiffness   Pain Onset --    Pain Frequency Constant           OBJECTIVE  Lumbar AROM: - Flexion: = to ankles, worse as going down, abberant movement coming up. ERP, worse after. - Extension: = 25% no worse. - Rotation: R = 100%, L = 50% pain/tightness - Side Flexion: R = 100%, L = 100%.  Repeated lumbar extension in standing 2 x10: feels like it is stretching, improved pain and ROM for left rotation.    TREATMENT:  Therapeutic exercise:to centralize symptoms and improve ROM, strength,  muscular endurance, and activity tolerance required for successful completion of functional activities. - measurements of lumbar spine (see above) - standing repeated lumbar extension, 2x10  - standing repeated lumbar extension against counter, 2x10 - education and trial of posture correction sitting with towel lumbar roll - prone lying in lumbar extension (prone on elbows), x2-3 min - lumbar extension in lying x5 - Education on HEP including handout   Manual therapy: to reduce pain and tissue tension, improve range of motion, neuromodulation, in order to promote improved ability to complete functional activities. Prone position:  - STM to bilateral lumbar paraspinals near L4 to decreased spasm and pain  Pt required multimodal cuing for proper technique and to facilitate improved neuromuscular control, strength, range of motion, and functional ability resulting in improved performance and form.  HOME EXERCISE PROGRAM Access Code: [email or web address] URL: [email or web address] Date: 03/05/2020 Prepared by: Rosita Kea  Exercises Hooklying Isometric Clamshell - 2-3 sets - 10 reps - green band Supine 90/90 Alternating Toe Touch - 2-3 sets - 10 reps Prone Hip Extension with Bent Knee - One Pillow - 2-3 sets - 10 reps       PT Education - 03/09/20 1737    Education Details Exercise purpose/form. Self management techniques    Person(s) Educated Patient    Methods Explanation;Demonstration;Tactile cues;Verbal cues;Handout    Comprehension Verbalized understanding;Returned demonstration;Verbal cues required;Tactile cues required;Need further instruction            PT Short Term Goals - 01/21/20 2049      PT SHORT TERM GOAL #1   Title Be independent with initial home exercise program for self-management of symptoms.    Baseline Initial HEP provided at IE (01/16/2020);    Time 2    Period Weeks    Status Achieved    Target Date 01/30/20             PT Long Term Goals  - 01/16/20 1806      PT LONG TERM GOAL #1   Title Be independent with a long-term home exercise program for self-management of symptoms.    Baseline Initial HEP provided at IE (01/16/2020);    Time 12    Period Weeks    Status New   TARGET DATE FOR ALL LONG TERM GOALS: 04/09/2020     PT LONG TERM GOAL #2   Title Demonstrate improved FOTO score by 10 or more points to demonstrate improvement in overall condition and self-reported functional ability.    Baseline test at visit 2 (01/16/2020);    Time 12    Period Weeks    Status New      PT LONG TERM GOAL #3   Title Improve core strength to hold 45 degree unsupported reclined position for 60 seconds and modified plank for 60 seconds  for improved core strength to allow patient to complete valued functional tasks such as lifting, bending, sitting with less difficulty.    Baseline 50 seconds with difficulty holding position steady (01/16/2020);    Time 12    Period Weeks    Status New      PT LONG TERM GOAL #4   Title Patient will demonstrate B hip strength 4+/5 to demonstrate functional strength for improved lifting, carrying, and bending.    Baseline see objective exam (01/16/2020);    Time 12    Period Weeks    Status New    Target Date 04/09/20      PT LONG TERM GOAL #5   Title Complete community, work and/or recreational activities without limitation due to current condition.    Baseline when her symptoms flair it is very difficult to work, it makes ADLs, and IADLs very hard. It disrupts her concentration which is instrumental to her work. She is fearful it may cripple her for a day or a few days and she would not be able to work. (01/16/2020);    Time 12    Period Weeks    Status New                 Plan - 03/09/20 1737    Clinical Impression Statement Patient tolerated treatment well and reported decreased tightness and improved pain and motion following extension exercises and manual therapy. Flair in symptoms do appear to be  responsive to extension principle for MDT  lumbar derangement syndrome. Patient notes she sat on the couch and watched football for many hours the day prior to onset of pain excaerbation. Patient would benefit from continued management of limiting condition by skilled physical therapist to address remaining impairments and functional limitations to work towards stated goals and return to PLOF or maximal functional independence.    Personal Factors and Comorbidities Age;Behavior Pattern;Comorbidity 3+    Comorbidities Relevant past medical history and comorbidities include migraines and neck pain, major depressive disorder (patient feels well managed at this point), skin cancer spot removed recently, Hx GI discomfort/problems.    Examination-Activity Limitations Bed Mobility;Bend;Locomotion Level;Stand;Lift;Hygiene/Grooming;Squat;Bathing;Caring for Others;Carry;Sit;Transfers    Examination-Participation Restrictions Occupation;Driving;Community Activity;Cleaning;Laundry;Shop    Stability/Clinical Decision Making Stable/Uncomplicated    Rehab Potential Good    PT Frequency 2x / week    PT Duration 12 weeks    PT Treatment/Interventions ADLs/Self Care Home Management;Moist Heat;Cryotherapy;Therapeutic activities;Therapeutic exercise;Neuromuscular re-education;Patient/family education;Manual techniques;Passive range of motion;Dry needling;Spinal Manipulations;Joint Manipulations    PT Next Visit Plan strengthening, assess response to HEP    PT Home Exercise Plan Medbridge Access Code: [email or web address]    Consulted and Agree with Plan of Care Patient           Patient will benefit from skilled therapeutic intervention in order to improve the following deficits and impairments:  Pain, Increased muscle spasms, Hypermobility, Decreased coordination, Decreased activity tolerance, Decreased endurance, Decreased range of motion, Decreased strength, Hypomobility, Impaired perceived functional ability, Impaired  flexibility  Visit Diagnosis: Bilateral low back pain without sciatica, unspecified chronicity  Muscle weakness (generalized)     Problem List Patient Active Problem List   Diagnosis Date Noted  . MDD (major depressive disorder), recurrent, in full remission ([place]) 10/03/2019  . MDD (major depressive disorder), recurrent, in partial remission ([place]) 02/11/2019  . Binge eating disorder 02/11/2019  . Vitamin D deficiency 08/14/2018  . Intractable chronic migraine without aura and without status migrainosus 03/04/2018  . Postmenopausal 02/15/2018  . Pure hypercholesterolemia 02/15/2018  .  Unintended weight gain 02/15/2018   Everlean Alstrom. Graylon Good, PT, DPT 03/09/20, 5:39 PM  [facility] [address] [place], [address] Phone: [phone]   Fax:  [phone]  Name: Barbara Russo MRN: [id number] Date of Birth: 1960-08-08

## 2020-03-12 ENCOUNTER — Encounter: Payer: Self-pay | Admitting: Physical Therapy

## 2020-03-12 ENCOUNTER — Ambulatory Visit: Payer: 59 | Admitting: Physical Therapy

## 2020-03-12 ENCOUNTER — Other Ambulatory Visit: Payer: Self-pay

## 2020-03-12 DIAGNOSIS — M545 Low back pain, unspecified: Secondary | ICD-10-CM

## 2020-03-12 DIAGNOSIS — M6281 Muscle weakness (generalized): Secondary | ICD-10-CM

## 2020-03-12 NOTE — Therapy (Signed)
[facility] [address] [place], [address] Phone: [phone]   Fax:  [phone]  Physical Therapy Treatment  Patient Details  Name: Barbara Russo MRN: [id number] Date of Birth: 1960/08/23 Referring Provider (PT): Allene Dillon, NP   Encounter Date: 03/12/2020   PT End of Session - 03/12/20 1705    Visit Number 8    Number of Visits 24    Date for PT Re-Evaluation 04/09/20    PT Start Time 1655    PT Stop Time 1735    PT Time Calculation (min) 40 min    Activity Tolerance Patient tolerated treatment well    Behavior During Therapy [facility] for tasks assessed/performed           Past Medical History:  Diagnosis Date  . Anxiety   . Depression   . Dysplastic nevus 10/03/2019   L lower back paraspinal mod to severe (shave removal)  . Squamous cell carcinoma of skin 09/26/2019   SCCIS - L prox lat pretibial     Past Surgical History:  Procedure Laterality Date  . BREAST BIOPSY Left 2008   neg    There were no vitals filed for this visit.   Subjective Assessment - 03/12/20 1658    Subjective Patient reports she is stil having pain in her back but she feels she has recovered better due to what she did in PT last session. Her back pain is now distinctly on the left and 3/10 pain upon arrival. States she feels her left leg is a bit weaker when she flexes her hip sitting. Has been doing both her HEP. Having difficulty sleeping. The roll behind her back has been helpful.    Pertinent History Patient is a 59 y.o. female who presents to outpatient physical therapy with a referral for medical diagnosis lumbar DDD (degenerative disc disease), back muscle spasm. This patient's chief complaints consist of chronic low back pain with increasing episodes of severe pain, leading to the following functional deficits: when her symptoms flair it is very difficult to work, it makes ADLs, and IADLs very hard. It  disrupts her concentration which is instrumental to her work. She is fearful it may cripple her for a day or a few days and she would not be able to work.  Relevant past medical history and comorbidities include migraines and neck pain, major depressive disorder (patient feels well managed at this point), skin cancer spot removed recently, Hx GI discomfort/problems. Patient denies hx stroke, seizures, lung problem, major cardiac events, diabetes, changes in bowel or bladder problems, new onset stumbling or dropping things.    Limitations --   when her symptoms flair it is very difficult to work, it makes ADLs, and IADLs very hard. It disrupts her concentration which is instrumental to her work. She is fearful it may cripple her for a day or a few days and she would not be able to work.   Diagnostic tests CT abdomen report 08/09/2019: "IMPRESSION:1. Distal colonic diverticulosis without diverticulitis.2. Mild fatty infiltration of the liver.3. No acute intra-abdominal or intrapelvic process. No explanation for weight loss."    Patient Stated Goals would like to make the back feel better and not have these episodes    Currently in Pain? Yes    Pain Score 3            OBJECTIVE  Lumbar AROM:  Flexion: = to ankles, worse as going down, abberant movement coming up. ERP, worse  after.  Extension: = 25% no pain  Rotation: R = 100%, L = 75% pain/tightness  Side Flexion: R = 100% pain on left, L = 100%.  Repeated lumbar extension in standing 1x10: feels like it is stretching, improved pain in previously painful motions. Repeated lumbar extension in standing over counter 2x10 decreased back pain. Improved pain in formerly painful motions.   TREATMENT:  Therapeutic exercise:to centralize symptoms and improve ROM, strength, muscular endurance, and activity tolerance required for successful completion of functional activities. - measurements of lumbar spine (see above) - standing repeated lumbar  extension, 1x10  - standing repeated lumbar extension against counter, 2x10 - lumbar extension in lying x10  Manual therapy: to reduce pain and tissue tension, improve range of motion, neuromodulation, in order to promote improved ability to complete functional activities. Prone position:  - STM to bilateral lumbar paraspinals nearL > R L4 to decreased spasm and pain  Pt required multimodal cuing for proper technique and to facilitate improved neuromuscular control, strength, range of motion, and functional ability resulting in improved performance and form.  HOME EXERCISE PROGRAM Access Code: [email or web address] URL: [email or web address] Date: 03/05/2020 Prepared by: Rosita Kea  Exercises Hooklying Isometric Clamshell - 2-3 sets - 10 reps - green band Supine 90/90 Alternating Toe Touch - 2-3 sets - 10 reps Prone Hip Extension with Bent Knee - One Pillow - 2-3 sets - 10 reps        PT Education - 03/12/20 1705    Education Details Exercise purpose/form. Self management techniques    Person(s) Educated Patient    Methods Explanation;Demonstration;Tactile cues;Verbal cues    Comprehension Verbalized understanding;Returned demonstration;Verbal cues required;Tactile cues required;Need further instruction            PT Short Term Goals - 01/21/20 2049      PT SHORT TERM GOAL #1   Title Be independent with initial home exercise program for self-management of symptoms.    Baseline Initial HEP provided at IE (01/16/2020);    Time 2    Period Weeks    Status Achieved    Target Date 01/30/20             PT Long Term Goals - 01/16/20 1806      PT LONG TERM GOAL #1   Title Be independent with a long-term home exercise program for self-management of symptoms.    Baseline Initial HEP provided at IE (01/16/2020);    Time 12    Period Weeks    Status New   TARGET DATE FOR ALL LONG TERM GOALS: 04/09/2020     PT LONG TERM GOAL #2   Title Demonstrate improved FOTO  score by 10 or more points to demonstrate improvement in overall condition and self-reported functional ability.    Baseline test at visit 2 (01/16/2020);    Time 12    Period Weeks    Status New      PT LONG TERM GOAL #3   Title Improve core strength to hold 45 degree unsupported reclined position for 60 seconds and modified plank for 60 seconds for improved core strength to allow patient to complete valued functional tasks such as lifting, bending, sitting with less difficulty.    Baseline 50 seconds with difficulty holding position steady (01/16/2020);    Time 12    Period Weeks    Status New      PT LONG TERM GOAL #4   Title Patient will demonstrate B hip strength 4+/5 to demonstrate functional strength for  improved lifting, carrying, and bending.    Baseline see objective exam (01/16/2020);    Time 12    Period Weeks    Status New    Target Date 04/09/20      PT LONG TERM GOAL #5   Title Complete community, work and/or recreational activities without limitation due to current condition.    Baseline when her symptoms flair it is very difficult to work, it makes ADLs, and IADLs very hard. It disrupts her concentration which is instrumental to her work. She is fearful it may cripple her for a day or a few days and she would not be able to work. (01/16/2020);    Time 12    Period Weeks    Status New                 Plan - 03/12/20 1741    Clinical Impression Statement Patient tolerated treatment well overall and had good improvement in pain and ROM with extension exercises. Also got relief from manual therapy and appears to be getting better overall but not back to PLOF. Patient would benefit from continued management of limiting condition by skilled physical therapist to address remaining impairments and functional limitations to work towards stated goals and return to PLOF or maximal functional independence    Personal Factors and Comorbidities Age;Behavior Pattern;Comorbidity 3+      Comorbidities Relevant past medical history and comorbidities include migraines and neck pain, major depressive disorder (patient feels well managed at this point), skin cancer spot removed recently, Hx GI discomfort/problems.    Examination-Activity Limitations Bed Mobility;Bend;Locomotion Level;Stand;Lift;Hygiene/Grooming;Squat;Bathing;Caring for Others;Carry;Sit;Transfers    Examination-Participation Restrictions Occupation;Driving;Community Activity;Cleaning;Laundry;Shop    Stability/Clinical Decision Making Stable/Uncomplicated    Rehab Potential Good    PT Frequency 2x / week    PT Duration 12 weeks    PT Treatment/Interventions ADLs/Self Care Home Management;Moist Heat;Cryotherapy;Therapeutic activities;Therapeutic exercise;Neuromuscular re-education;Patient/family education;Manual techniques;Passive range of motion;Dry needling;Spinal Manipulations;Joint Manipulations    PT Next Visit Plan strengthening, assess response to HEP    PT Home Exercise Plan Medbridge Access Code: [email or web address]    Consulted and Agree with Plan of Care Patient           Patient will benefit from skilled therapeutic intervention in order to improve the following deficits and impairments:  Pain, Increased muscle spasms, Hypermobility, Decreased coordination, Decreased activity tolerance, Decreased endurance, Decreased range of motion, Decreased strength, Hypomobility, Impaired perceived functional ability, Impaired flexibility  Visit Diagnosis: Bilateral low back pain without sciatica, unspecified chronicity  Muscle weakness (generalized)     Problem List Patient Active Problem List   Diagnosis Date Noted  . MDD (major depressive disorder), recurrent, in full remission ([place]) 10/03/2019  . MDD (major depressive disorder), recurrent, in partial remission ([place]) 02/11/2019  . Binge eating disorder 02/11/2019  . Vitamin D deficiency 08/14/2018  . Intractable chronic migraine without aura and without status  migrainosus 03/04/2018  . Postmenopausal 02/15/2018  . Pure hypercholesterolemia 02/15/2018  . Unintended weight gain 02/15/2018    Everlean Alstrom. Graylon Good, PT, DPT 03/12/20, 5:42 PM  [facility] [address] [place], [address] Phone: [phone]   Fax:  [phone]  Name: Tauheedah Bok MRN: [id number] Date of Birth: 18-Jan-1961

## 2020-03-16 ENCOUNTER — Encounter: Payer: 59 | Admitting: Physical Therapy

## 2020-03-23 ENCOUNTER — Ambulatory Visit: Payer: 59 | Attending: Family Medicine | Admitting: Physical Therapy

## 2020-03-26 ENCOUNTER — Ambulatory Visit: Payer: 59 | Admitting: Physical Therapy

## 2020-03-26 ENCOUNTER — Encounter: Payer: 59 | Admitting: Dermatology

## 2020-03-31 ENCOUNTER — Ambulatory Visit: Payer: 59 | Admitting: Physical Therapy

## 2020-04-07 ENCOUNTER — Encounter: Payer: 59 | Admitting: Physical Therapy

## 2020-04-13 ENCOUNTER — Encounter: Payer: Self-pay | Admitting: Physical Therapy

## 2020-04-13 DIAGNOSIS — M6281 Muscle weakness (generalized): Secondary | ICD-10-CM

## 2020-04-13 DIAGNOSIS — M545 Low back pain, unspecified: Secondary | ICD-10-CM

## 2020-04-13 NOTE — Therapy (Signed)
[facility] [address], [place], [address] Phone: [phone]   Fax:  [phone]  Physical Therapy No-Visit Discharge Summary Reporting Period: 01/16/2020 - 04/13/2020  Patient Details  Name: Barbara Russo MRN: [id number] Date of Birth: [birth year] Referring Provider (PT): Allene Dillon, NP   Encounter Date: 04/13/2020    Past Medical History:  Diagnosis Date  . Anxiety   . Depression   . Dysplastic nevus 10/03/2019   L lower back paraspinal mod to severe (shave removal)  . Squamous cell carcinoma of skin 09/26/2019   SCCIS - L prox lat pretibial     Past Surgical History:  Procedure Laterality Date  . BREAST BIOPSY Left 2008   neg    There were no vitals filed for this visit.   Subjective Assessment - 04/13/20 1101    Subjective Patient requested discharge due to being too busy to continue PT right now.    Pertinent History Patient is a 59 y.o. female who presents to outpatient physical therapy with a referral for medical diagnosis lumbar DDD (degenerative disc disease), back muscle spasm. This patient's chief complaints consist of chronic low back pain with increasing episodes of severe pain, leading to the following functional deficits: when her symptoms flair it is very difficult to work, it makes ADLs, and IADLs very hard. It disrupts her concentration which is instrumental to her work. She is fearful it may cripple her for a day or a few days and she would not be able to work.  Relevant past medical history and comorbidities include migraines and neck pain, major depressive disorder (patient feels well managed at this point), skin cancer spot removed recently, Hx GI discomfort/problems. Patient denies hx stroke, seizures, lung problem, major cardiac events, diabetes, changes in bowel or bladder problems, new onset stumbling or dropping things.    Limitations --   when her symptoms flair it is very  difficult to work, it makes ADLs, and IADLs very hard. It disrupts her concentration which is instrumental to her work. She is fearful it may cripple her for a day or a few days and she would not be able to work.   Diagnostic tests CT abdomen report 08/09/2019: "IMPRESSION:1. Distal colonic diverticulosis without diverticulitis.2. Mild fatty infiltration of the liver.3. No acute intra-abdominal or intrapelvic process. No explanation for weight loss."    Patient Stated Goals would like to make the back feel better and not have these episodes           OBJECTIVE Patient is not present for examination at this time. Please see previous documentation for latest objective data.      PT Short Term Goals - 01/21/20 2049      PT SHORT TERM GOAL #1   Title Be independent with initial home exercise program for self-management of symptoms.    Baseline Initial HEP provided at IE (01/16/2020);    Time 2    Period Weeks    Status Achieved    Target Date 01/30/20             PT Long Term Goals - 04/13/20 1101      PT LONG TERM GOAL #1   Title Be independent with a long-term home exercise program for self-management of symptoms.    Baseline Initial HEP provided at IE (01/16/2020);    Time 12    Period Weeks    Status Partially Met   TARGET DATE FOR ALL LONG TERM GOALS: 04/09/2020  PT LONG TERM GOAL #2   Title Demonstrate improved FOTO score by 10 or more points to demonstrate improvement in overall condition and self-reported functional ability.    Baseline test at visit 2 (01/16/2020);    Time 12    Period Weeks    Status Unable to assess      PT LONG TERM GOAL #3   Title Improve core strength to hold 45 degree unsupported reclined position for 60 seconds and modified plank for 60 seconds for improved core strength to allow patient to complete valued functional tasks such as lifting, bending, sitting with less difficulty.    Baseline 50 seconds with difficulty holding position steady  (01/16/2020);    Time 12    Period Weeks    Status Unable to assess      PT LONG TERM GOAL #4   Title Patient will demonstrate B hip strength 4+/5 to demonstrate functional strength for improved lifting, carrying, and bending.    Baseline see objective exam (01/16/2020);    Time 12    Period Weeks    Status Unable to assess      PT LONG TERM GOAL #5   Title Complete community, work and/or recreational activities without limitation due to current condition.    Baseline when her symptoms flair it is very difficult to work, it makes ADLs, and IADLs very hard. It disrupts her concentration which is instrumental to her work. She is fearful it may cripple her for a day or a few days and she would not be able to work. (01/16/2020);    Time 12    Period Weeks    Status Unable to assess                 Plan - 04/13/20 1103    Clinical Impression Statement Patient attended 8 physical therapy treatment sessions overall and had difficulty with regular attendance due to very busy work scheduled. Was improving core strength and did get some relief from PT when she had a flair in back pain. Was unable to continue working towards goals due to inability to attend PT regularly. Patient is now discharged from PT per her request due to not being able to attend.    Personal Factors and Comorbidities Age;Behavior Pattern;Comorbidity 3+    Comorbidities Relevant past medical history and comorbidities include migraines and neck pain, major depressive disorder (patient feels well managed at this point), skin cancer spot removed recently, Hx GI discomfort/problems.    Examination-Activity Limitations Bed Mobility;Bend;Locomotion Level;Stand;Lift;Hygiene/Grooming;Squat;Bathing;Caring for Others;Carry;Sit;Transfers    Examination-Participation Restrictions Occupation;Driving;Community Activity;Cleaning;Laundry;Shop    Stability/Clinical Decision Making Stable/Uncomplicated    Rehab Potential Good    PT Frequency  2x / week    PT Duration 12 weeks    PT Treatment/Interventions ADLs/Self Care Home Management;Moist Heat;Cryotherapy;Therapeutic activities;Therapeutic exercise;Neuromuscular re-education;Patient/family education;Manual techniques;Passive range of motion;Dry needling;Spinal Manipulations;Joint Manipulations    PT Next Visit Plan Patient is now discharged from [facility] Access Code: [email or web address]    Consulted and Agree with Plan of Care Patient           Patient will benefit from skilled therapeutic intervention in order to improve the following deficits and impairments:  Pain, Increased muscle spasms, Hypermobility, Decreased coordination, Decreased activity tolerance, Decreased endurance, Decreased range of motion, Decreased strength, Hypomobility, Impaired perceived functional ability, Impaired flexibility  Visit Diagnosis: Bilateral low back pain without sciatica, unspecified chronicity  Muscle weakness (generalized)     Problem List Patient  Active Problem List   Diagnosis Date Noted  . MDD (major depressive disorder), recurrent, in full remission ([place]) 10/03/2019  . MDD (major depressive disorder), recurrent, in partial remission ([place]) 02/11/2019  . Binge eating disorder 02/11/2019  . Vitamin D deficiency 08/14/2018  . Intractable chronic migraine without aura and without status migrainosus 03/04/2018  . Postmenopausal 02/15/2018  . Pure hypercholesterolemia 02/15/2018  . Unintended weight gain 02/15/2018   Everlean Alstrom. Graylon Good, PT, DPT 04/13/20, 11:04 AM  [facility] [address], [place], [address] Phone: [phone]   Fax:  [phone]  Name: Shonteria Abeln MRN: [id number] Date of Birth: 02/19/1961

## 2020-05-11 ENCOUNTER — Telehealth (INDEPENDENT_AMBULATORY_CARE_PROVIDER_SITE_OTHER): Payer: 59 | Admitting: Psychiatry

## 2020-05-11 ENCOUNTER — Encounter: Payer: Self-pay | Admitting: Psychiatry

## 2020-05-11 ENCOUNTER — Other Ambulatory Visit: Payer: Self-pay

## 2020-05-11 DIAGNOSIS — F5081 Binge eating disorder: Secondary | ICD-10-CM

## 2020-05-11 DIAGNOSIS — F3342 Major depressive disorder, recurrent, in full remission: Secondary | ICD-10-CM | POA: Diagnosis not present

## 2020-05-11 MED ORDER — BUSPIRONE HCL 15 MG PO TABS: 15.0000 mg | ORAL_TABLET | Freq: Three times a day (TID) | ORAL | 0 refills | Status: DC

## 2020-05-11 NOTE — Progress Notes (Signed)
Virtual Visit via Video Note  I connected with Barbara Russo on 05/11/20 at 10:00 AM EST by a video enabled telemedicine application and verified that I am speaking with the correct person using two identifiers.  Location Provider Location : [facility] Patient Location : Home  Participants: Patient , Provider    I discussed the limitations of evaluation and management by telemedicine and the availability of in person appointments. The patient expressed understanding and agreed to proceed.  I discussed the assessment and treatment plan with the patient. The patient was provided an opportunity to ask questions and all were answered. The patient agreed with the plan and demonstrated an understanding of the instructions.  The patient was advised to call back or seek an in-person evaluation if the symptoms worsen or if the condition fails to improve as anticipated.   [facility] MD OP Progress Note  05/11/2020 11:24 AM Barbara Russo  MRN:  [id number]  Chief Complaint:  Chief Complaint    Follow-up     HPI: Barbara Russo is a 59 year old Caucasian female, single, employed, lives in [place], has a history of MDD, binge eating disorder, headaches, hyperlipidemia was evaluated by telemedicine today.  Patient today reports she is currently doing well.  She had a good Thanksgiving holiday with her family.  She reports work as busy.  She is compliant on medications.  Denies side effects.  Denies any significant mood swings or depressive episodes.  Reports appetite is good.  Denies suicidality, homicidality or perceptual disturbances.  She reports sleep continues to be good.  Visit Diagnosis:    ICD-10-CM   1. MDD (major depressive disorder), recurrent, in full remission ([place])  F33.42   2. Binge eating disorder  F50.81 busPIRone (BUSPAR) 15 MG tablet    Past Psychiatric History: I have reviewed past psychiatric history from my progress note on 01/30/2018.  Past trials of Valium,  Wellbutrin, BuSpar, Ritalin, Depakote, Vyvanse  Past Medical History:  Past Medical History:  Diagnosis Date  . Anxiety   . Depression   . Dysplastic nevus 10/03/2019   L lower back paraspinal mod to severe (shave removal)  . Squamous cell carcinoma of skin 09/26/2019   SCCIS - L prox lat pretibial     Past Surgical History:  Procedure Laterality Date  . BREAST BIOPSY Left 2008   neg    Family Psychiatric History: I have reviewed family psychiatric history from my progress note on 01/30/2018  Family History:  Family History  Adopted: Yes    Social History: Reviewed social history from my progress note on 01/30/2018 Social History   Socioeconomic History  . Marital status: Single    Spouse name: Not on file  . Number of children: 0  . Years of education: Not on file  . Highest education level: Bachelor's degree (e.g., BA, AB, BS)  Occupational History  . Not on file  Tobacco Use  . Smoking status: Never Smoker  . Smokeless tobacco: Never Used  Vaping Use  . Vaping Use: Never used  Substance and Sexual Activity  . Alcohol use: Yes    Alcohol/week: 2.0 standard drinks    Types: 2 Cans of beer per week  . Drug use: Never  . Sexual activity: Not Currently  Other Topics Concern  . Not on file  Social History Narrative  . Not on file   Social Determinants of Health   Financial Resource Strain:   . Difficulty of Paying Living Expenses: Not on file  Food Insecurity:   .  Worried About Charity fundraiser in the Last Year: Not on file  . Ran Out of Food in the Last Year: Not on file  Transportation Needs:   . Lack of Transportation (Medical): Not on file  . Lack of Transportation (Non-Medical): Not on file  Physical Activity:   . Days of Exercise per Week: Not on file  . Minutes of Exercise per Session: Not on file  Stress:   . Feeling of Stress : Not on file  Social Connections:   . Frequency of Communication with Friends and Family: Not on file  . Frequency  of Social Gatherings with Friends and Family: Not on file  . Attends Religious Services: Not on file  . Active Member of Clubs or Organizations: Not on file  . Attends Archivist Meetings: Not on file  . Marital Status: Not on file    Allergies: No Known Allergies  Metabolic Disorder Labs: No results found for: HGBA1C, MPG No results found for: PROLACTIN No results found for: CHOL, TRIG, HDL, CHOLHDL, VLDL, LDLCALC No results found for: TSH  Therapeutic Level Labs: No results found for: LITHIUM No results found for: VALPROATE No components found for:  CBMZ  Current Medications: Current Outpatient Medications  Medication Sig Dispense Refill  . Botulinum Toxin Type A (BOTOX) 200 units SOLR INJECT 200 UNITS  INTRAMUSCULARLY EVERY 3  MONTHS (GIVEN AT MD OFFICE, DISCARD UNUSED)    . Acetaminophen-Codeine 300-30 MG tablet Take 1 tablet by mouth every 4 (four) hours as needed.    Marland Kitchen buPROPion (WELLBUTRIN XL) 300 MG 24 hr tablet Take 1 tablet (300 mg total) by mouth daily. 90 tablet 1  . busPIRone (BUSPAR) 15 MG tablet Take 1 tablet (15 mg total) by mouth 3 (three) times daily. 270 tablet 0  . Butalbital-APAP-Caffeine 50-325-40 MG capsule Take by mouth.    . Calcium Carbonate-Vitamin D (CALCIUM HIGH POTENCY/VITAMIN D) 600-200 MG-UNIT TABS Take 600 mg by mouth.    . calcium citrate-vitamin D (CITRACAL+D) 315-200 MG-UNIT tablet Take by mouth.    . cetirizine (ZYRTEC) 10 MG tablet Take by mouth.    . Coenzyme Q10 10 MG capsule Take by mouth.    Marland Kitchen COVID-19 Specimen Collection KIT See admin instructions. for testing    . [employer] PURELAX 17 GM/SCOOP powder TAKE AS DIRECTED FOR COLONIC PREP.    Marland Kitchen divalproex (DEPAKOTE) 250 MG DR tablet Take by mouth.    . gabapentin (NEURONTIN) 400 MG capsule Take 400 mg by mouth 3 (three) times daily.    . mupirocin ointment (BACTROBAN) 2 % Apply 1 application topically daily. With dressing changes 22 g 0  . naratriptan (AMERGE) 2.5 MG tablet Take 2.5 mg  by mouth daily as needed. (Patient not taking: Reported on 01/16/2020)    . polyethylene glycol powder (GLYCOLAX/MIRALAX) 17 GM/SCOOP powder Take as directed for colonic prep. (Patient not taking: Reported on 01/16/2020)    . predniSONE (DELTASONE) 10 MG tablet Take by mouth.    . predniSONE (DELTASONE) 10 MG tablet 6 day taper - Take as directed    . prochlorperazine (COMPAZINE) 5 MG tablet Take 5 mg by mouth every 6 (six) hours as needed.    . Rimegepant Sulfate 75 MG TBDP Take by mouth.    . simvastatin (ZOCOR) 20 MG tablet Take 20 mg by mouth daily. (Patient not taking: Reported on 01/16/2020)    . topiramate (TOPAMAX) 100 MG tablet Take by mouth.    . traMADol (ULTRAM) 50 MG tablet  Take by mouth.    . traMADol (ULTRAM) 50 MG tablet Take by mouth.    . zolmitriptan (ZOMIG-ZMT) 5 MG disintegrating tablet Take by mouth. (Patient not taking: Reported on 01/16/2020)     No current facility-administered medications for this visit.     Musculoskeletal: Strength & Muscle Tone: UTA Gait & Station: normal Patient leans: N/A  Psychiatric Specialty Exam: Review of Systems  Psychiatric/Behavioral: Negative for agitation, behavioral problems, confusion, decreased concentration, dysphoric mood, hallucinations, self-injury, sleep disturbance and suicidal ideas. The patient is not nervous/anxious and is not hyperactive.   All other systems reviewed and are negative.   There were no vitals taken for this visit.There is no height or weight on file to calculate BMI.  General Appearance: Casual  Eye Contact:  Fair  Speech:  Clear and Coherent  Volume:  Normal  Mood:  Euthymic  Affect:  Congruent  Thought Process:  Goal Directed and Descriptions of Associations: Intact  Orientation:  Full (Time, Place, and Person)  Thought Content: Logical   Suicidal Thoughts:  No  Homicidal Thoughts:  No  Memory:  Immediate;   Fair Recent;   Fair Remote;   Fair  Judgement:  Fair  Insight:  Fair  Psychomotor  Activity:  Normal  Concentration:  Concentration: Fair and Attention Span: Fair  Recall:  [employer] of Knowledge: Fair  Language: Fair  Akathisia:  No  Handed:  Right  AIMS (if indicated): UTA  Assets:  Communication Skills Desire for Improvement Housing Social Support  ADL's:  Intact  Cognition: WNL  Sleep:  Fair   Screenings: PHQ2-9     Office Visit from 02/11/2019 in [facility]  PHQ-2 Total Score 1       Assessment and Plan: Barbara Russo is a 59 year old Caucasian female, single, lives in [place], has a history of depression, binge eating disorder was evaluated by telemedicine today.  Patient is currently stable on current medication regimen.  Plan as noted below.  Plan MDD in remission Wellbutrin XL 300 mg p.o. daily BuSpar 15 mg p.o. twice daily  Binge eating disorder in remission We will monitor closely  Follow-up in clinic in 3 months or sooner if needed.  I have spent atleast 20 minutes face to face by video with patient today. More than 50 % of the time was spent for preparing to see the patient ( e.g., review of test, records ), ordering medications and test ,psychoeducation and supportive psychotherapy and care coordination,as well as documenting clinical information in electronic health record. This note was generated in part or whole with voice recognition software. Voice recognition is usually quite accurate but there are transcription errors that can and very often do occur. I apologize for any typographical errors that were not detected and corrected.      Ursula Alert, MD 05/11/2020, 11:24 AM

## 2020-05-20 ENCOUNTER — Other Ambulatory Visit: Payer: Self-pay | Admitting: Physician Assistant

## 2020-05-20 DIAGNOSIS — Z1231 Encounter for screening mammogram for malignant neoplasm of breast: Secondary | ICD-10-CM

## 2020-07-02 ENCOUNTER — Other Ambulatory Visit: Payer: Self-pay

## 2020-07-02 ENCOUNTER — Encounter: Payer: Self-pay | Admitting: Dermatology

## 2020-07-02 ENCOUNTER — Ambulatory Visit: Payer: BC Managed Care – PPO | Admitting: Dermatology

## 2020-07-02 DIAGNOSIS — L578 Other skin changes due to chronic exposure to nonionizing radiation: Secondary | ICD-10-CM

## 2020-07-02 DIAGNOSIS — D485 Neoplasm of uncertain behavior of skin: Secondary | ICD-10-CM | POA: Diagnosis not present

## 2020-07-02 DIAGNOSIS — Z85828 Personal history of other malignant neoplasm of skin: Secondary | ICD-10-CM | POA: Diagnosis not present

## 2020-07-02 DIAGNOSIS — Z86018 Personal history of other benign neoplasm: Secondary | ICD-10-CM | POA: Diagnosis not present

## 2020-07-02 NOTE — Progress Notes (Signed)
Follow-Up Visit   Subjective  Barbara Russo is a 60 y.o. female who presents for the following: Nevus (Patient here today to have spot at left leg checked. Patient noticed since last visit with Dr. Nehemiah Massed in August 2021. She advises it has been growing. Patient with history of dysplastic nevus and SCC.).  The following portions of the chart were reviewed this encounter and updated as appropriate:   Tobacco  Allergies  Meds  Problems  Med Hx  Surg Hx  Fam Hx      Review of Systems:  No other skin or systemic complaints except as noted in HPI or Assessment and Plan.  Objective  Well appearing patient in no apparent distress; mood and affect are within normal limits.  A focused examination was performed including left lower leg. Relevant physical exam findings are noted in the Assessment and Plan.  Objective  Left lateral calf: 0.9cm scaly pink thick papule        Objective  Left medial calf: 0.5cm scaly pink papule        Assessment & Plan  Neoplasm of uncertain behavior of skin (2) Left lateral calf  Skin / nail biopsy Type of biopsy: tangential   Informed consent: discussed and consent obtained   Timeout: patient name, date of birth, surgical site, and procedure verified   Patient was prepped and draped in usual sterile fashion: Area prepped with isopropyl alcohol. Anesthesia: the lesion was anesthetized in a standard fashion   Anesthetic:  1% lidocaine w/ epinephrine 1-100,000 buffered w/ 8.4% NaHCO3 Instrument used: flexible razor blade   Hemostasis achieved with: aluminum chloride   Outcome: patient tolerated procedure well   Post-procedure details: wound care instructions given   Additional details:  Mupirocin and a bandage applied  Specimen 1 - Surgical pathology Differential Diagnosis: r/o SCC  Check Margins: No 0.9cm scaly pink thick papule  Left medial calf  Skin / nail biopsy Type of biopsy: tangential   Informed consent:  discussed and consent obtained   Timeout: patient name, date of birth, surgical site, and procedure verified   Patient was prepped and draped in usual sterile fashion: Area prepped with isopropyl alcohol. Anesthesia: the lesion was anesthetized in a standard fashion   Anesthetic:  1% lidocaine w/ epinephrine 1-100,000 buffered w/ 8.4% NaHCO3 Instrument used: flexible razor blade   Hemostasis achieved with: aluminum chloride   Outcome: patient tolerated procedure well   Post-procedure details: wound care instructions given   Additional details:  Mupirocin and a bandage applied  Specimen 2 - Surgical pathology Differential Diagnosis: r/o SCC  Check Margins: No 0.5cm scaly pink papule  History of Squamous Cell Carcinoma in Situ of the Skin - No evidence of recurrence today at left prox lat pretibial - Recommend regular full body skin exams - Recommend daily broad spectrum sunscreen SPF 30+ to sun-exposed areas, reapply every 2 hours as needed.  - Call if any new or changing lesions are noted between office visits - Recommend Nicotinamide 500mg  twice per day to lower risk of non-melanoma skin cancer by approximately 25%.   Actinic Damage - chronic, secondary to cumulative UV radiation exposure/sun exposure over time - diffuse scaly erythematous macules with underlying dyspigmentation - Recommend daily broad spectrum sunscreen SPF 30+ to sun-exposed areas, reapply every 2 hours as needed.  - Call for new or changing lesions. - Recommend taking Heliocare sun protection supplement daily in sunny weather for additional sun protection.   Return for TBSE as scheduled.  Graciella Belton,  RMA, am acting as scribe for Forest Gleason, MD .  Documentation: I have reviewed the above documentation for accuracy and completeness, and I agree with the above.  Forest Gleason, MD

## 2020-07-02 NOTE — Patient Instructions (Addendum)
Wound Care Instructions  1. Cleanse wound gently with soap and water once a day then pat dry with clean gauze. Apply a thing coat of Petrolatum (petroleum jelly, "Vaseline") over the wound (unless you have an allergy to this). We recommend that you use a new, sterile tube of Vaseline. Do not pick or remove scabs. Do not remove the yellow or white "healing tissue" from the base of the wound.  2. Cover the wound with fresh, clean, nonstick gauze and secure with paper tape. You may use Band-Aids in place of gauze and tape if the would is small enough, but would recommend trimming much of the tape off as there is often too much. Sometimes Band-Aids can irritate the skin.  3. You should call the office for your biopsy report after 1 week if you have not already been contacted.  4. If you experience any problems, such as abnormal amounts of bleeding, swelling, significant bruising, significant pain, or evidence of infection, please call the office immediately.   Recommend taking Heliocare sun protection supplement daily in sunny weather for additional sun protection. For maximum protection on the sunniest days, you can take up to 2 capsules of regular Heliocare OR take 1 capsule of Heliocare Ultra. For prolonged exposure (such as a full day in the sun), you can repeat your dose of the supplement 4 hours after your first dose. Heliocare can be purchased at [facility] or at [email or web address].    Recommend Nicotinamide 500mg  twice per day to lower risk of non-melanoma skin cancer by approximately 25%.

## 2020-07-06 ENCOUNTER — Encounter: Payer: Self-pay | Admitting: Dermatology

## 2020-07-07 ENCOUNTER — Telehealth: Payer: Self-pay

## 2020-07-07 NOTE — Progress Notes (Signed)
1. Skin , left lateral calf HYPERTROPHIC ACTINIC KERATOSIS, BASE INVOLVED, bordering on SCCis and cannot rule out SCC at base-> Recommend shave removal with ED&C given clinical suggestive of SCC  2. Skin , left medial calf FOCAL ACANTHOLYTIC DYSKERATOSIS -> Benign skin spot, no treatment needed.  Dr. Laurence Ferrari called 07/07/2020 at 1:25 PM. No answer, left voicemail  MAs please call and schedule for [facility]. If pt has questions before then, let Dr. Laurence Ferrari know. Thank you!

## 2020-07-07 NOTE — Telephone Encounter (Signed)
-----   Message from Alfonso Patten, MD sent at 07/07/2020  1:28 PM EST ----- 1. Skin , left lateral calf HYPERTROPHIC ACTINIC KERATOSIS, BASE INVOLVED, bordering on SCCis and cannot rule out SCC at base-> Recommend shave removal with ED&C given clinical suggestive of SCC  2. Skin , left medial calf FOCAL ACANTHOLYTIC DYSKERATOSIS -> Benign skin spot, no treatment needed.  Dr. Laurence Ferrari called 07/07/2020 at 1:25 PM. No answer, left voicemail  MAs please call and schedule for [facility]. If pt has questions before then, let Dr. Laurence Ferrari know. Thank you!

## 2020-07-07 NOTE — Telephone Encounter (Signed)
Patient advised of biopsy results and scheduled. °

## 2020-07-14 ENCOUNTER — Telehealth: Payer: Self-pay

## 2020-07-14 DIAGNOSIS — F5081 Binge eating disorder: Secondary | ICD-10-CM

## 2020-07-14 MED ORDER — BUSPIRONE HCL 15 MG PO TABS
15.0000 mg | ORAL_TABLET | Freq: Three times a day (TID) | ORAL | 0 refills | Status: DC
Start: 2020-07-14 — End: 2020-08-21

## 2020-07-14 NOTE — Telephone Encounter (Signed)
I have sent BuSpar to pharmacy. 

## 2020-07-14 NOTE — Telephone Encounter (Signed)
Pt called states she needs a refill on her buspar  busPIRone (BUSPAR) 15 MG tablet Medication Date: 05/11/2020 Department: [facility] Ordering/Authorizing: Ursula Alert, MD    Order Providers  Prescribing Provider Encounter Provider  Ursula Alert, MD Ursula Alert, MD   Outpatient Medication Detail   Disp Refills Start End   busPIRone (BUSPAR) 15 MG tablet 270 tablet 0 05/11/2020    Sig - Route: Take 1 tablet (15 mg total) by mouth 3 (three) times daily. - Oral   Sent to pharmacy as: busPIRone (BUSPAR) 15 MG tablet   E-Prescribing Status: Receipt confirmed by pharmacy (05/11/2020 10:12 AM EST)    Associated Diagnoses  Binge eating disorder      Pharmacy  [id number] [address]ina Rabon, [place]

## 2020-07-15 ENCOUNTER — Other Ambulatory Visit: Payer: Self-pay

## 2020-07-15 ENCOUNTER — Other Ambulatory Visit: Payer: Self-pay | Admitting: Dermatology

## 2020-07-15 ENCOUNTER — Ambulatory Visit: Payer: BC Managed Care – PPO | Admitting: Dermatology

## 2020-07-15 DIAGNOSIS — D485 Neoplasm of uncertain behavior of skin: Secondary | ICD-10-CM

## 2020-07-15 DIAGNOSIS — L570 Actinic keratosis: Secondary | ICD-10-CM | POA: Diagnosis not present

## 2020-07-15 NOTE — Patient Instructions (Signed)

## 2020-07-15 NOTE — Progress Notes (Signed)
   Follow-Up Visit   Subjective  Barbara Russo is a 60 y.o. female who presents for the following: HYPERTROPHIC ACTINIC KERATOSIS, BASE INVOLVED (Bx proven - L lat calf, patient is here today for shave removal and ED&C).  The following portions of the chart were reviewed this encounter and updated as appropriate:   Tobacco  Allergies  Meds  Problems  Med Hx  Surg Hx  Fam Hx     Review of Systems:  No other skin or systemic complaints except as noted in HPI or Assessment and Plan.  Objective  Well appearing patient in no apparent distress; mood and affect are within normal limits.  A focused examination was performed including the left leg. Relevant physical exam findings are noted in the Assessment and Plan.  Objective  L lat calf: Healing biopsy site  Assessment & Plan  Neoplasm of uncertain behavior of skin L lat calf  Destruction of lesion Complexity: extensive   Destruction method: electrodesiccation and curettage   Informed consent: discussed and consent obtained   Timeout:  patient name, date of birth, surgical site, and procedure verified Procedure prep:  Patient was prepped and draped in usual sterile fashion Prep type:  Isopropyl alcohol Anesthesia: the lesion was anesthetized in a standard fashion   Anesthetic:  1% lidocaine w/ epinephrine 1-100,000 buffered w/ 8.4% NaHCO3 Curettage performed in three different directions: Yes   Electrodesiccation performed over the curetted area: Yes   Final wound size (cm):  1.4 Hemostasis achieved with:  pressure, aluminum chloride and electrodesiccation Outcome: patient tolerated procedure well with no complications   Post-procedure details: sterile dressing applied and wound care instructions given   Dressing type: bandage and petrolatum    Epidermal / dermal shaving  Lesion diameter (cm):  1.2 Informed consent: discussed and consent obtained   Timeout: patient name, date of birth, surgical site, and procedure  verified   Anesthesia: the lesion was anesthetized in a standard fashion   Anesthetic:  1% lidocaine w/ epinephrine 1-100,000 local infiltration Instrument used: flexible razor blade   Hemostasis achieved with: aluminum chloride   Outcome: patient tolerated procedure well   Post-procedure details: wound care instructions given   Additional details:  Mupirocin and a bandage applied  Specimen 1 - Surgical pathology Differential Diagnosis: D48.5 r/o hypertrophic AK vs SCC  ED&C today  Check Margins: No Healing biopsy site [id number]  Biopsy showed HYPERTROPHIC ACTINIC KERATOSIS, BASE INVOLVED, bordering on SCCis and cannot rule out SCC at base  Return for appointment as scheduled.  Luther Redo, CMA, am acting as scribe for Forest Gleason, MD .  Documentation: I have reviewed the above documentation for accuracy and completeness, and I agree with the above.  Forest Gleason, MD

## 2020-07-16 ENCOUNTER — Ambulatory Visit: Payer: 59 | Admitting: Dermatology

## 2020-07-21 ENCOUNTER — Ambulatory Visit
Admission: RE | Admit: 2020-07-21 | Discharge: 2020-07-21 | Disposition: A | Discharge status: Home / Self Care | Payer: BC Managed Care – PPO | Source: Ambulatory Visit | Attending: Physician Assistant | Admitting: Physician Assistant

## 2020-07-21 ENCOUNTER — Other Ambulatory Visit: Payer: Self-pay

## 2020-07-21 DIAGNOSIS — Z1231 Encounter for screening mammogram for malignant neoplasm of breast: Secondary | ICD-10-CM

## 2020-08-10 ENCOUNTER — Encounter: Payer: Self-pay | Admitting: Dermatology

## 2020-08-11 ENCOUNTER — Telehealth: Payer: 59 | Admitting: Psychiatry

## 2020-08-13 ENCOUNTER — Telehealth: Payer: Self-pay

## 2020-08-13 NOTE — Telephone Encounter (Signed)
Patient advised of biopsy results.

## 2020-08-13 NOTE — Telephone Encounter (Signed)
Patient not yet reviewed bx results on MyChart. Called patient and left msg to return call to office for results, JS

## 2020-08-21 ENCOUNTER — Encounter: Payer: Self-pay | Admitting: Psychiatry

## 2020-08-21 ENCOUNTER — Telehealth (INDEPENDENT_AMBULATORY_CARE_PROVIDER_SITE_OTHER): Payer: BC Managed Care – PPO | Admitting: Psychiatry

## 2020-08-21 ENCOUNTER — Other Ambulatory Visit: Payer: Self-pay

## 2020-08-21 DIAGNOSIS — F5081 Binge eating disorder: Secondary | ICD-10-CM | POA: Diagnosis not present

## 2020-08-21 DIAGNOSIS — F3342 Major depressive disorder, recurrent, in full remission: Secondary | ICD-10-CM | POA: Diagnosis not present

## 2020-08-21 MED ORDER — BUSPIRONE HCL 15 MG PO TABS: 15.0000 mg | ORAL_TABLET | Freq: Three times a day (TID) | ORAL | 0 refills | Status: DC

## 2020-08-21 MED ORDER — BUPROPION HCL ER (XL) 300 MG PO TB24: 300.0000 mg | ORAL_TABLET | Freq: Every day | ORAL | 1 refills | Status: DC

## 2020-08-21 NOTE — Progress Notes (Signed)
Virtual Visit via Video Note  I connected with Barbara Russo on 08/21/20 at  9:40 AM EST by a video enabled telemedicine application and verified that I am speaking with the correct person using two identifiers.  Location Provider Location : [facility] Patient Location : Home  Participants: Patient , Provider   I discussed the limitations of evaluation and management by telemedicine and the availability of in person appointments. The patient expressed understanding and agreed to proceed.    I discussed the assessment and treatment plan with the patient. The patient was provided an opportunity to ask questions and all were answered. The patient agreed with the plan and demonstrated an understanding of the instructions.   The patient was advised to call back or seek an in-person evaluation if the symptoms worsen or if the condition fails to improve as anticipated.   [place] MD OP Progress Note  08/21/2020 1:00 PM Danyel Tobey  MRN:  [id number]  Chief Complaint:  Chief Complaint    Follow-up; Depression; Eating Disorder     HPI: Barbara Russo is a 60 year old Caucasian female, single, employed, lives in [place], has a history of MDD, binge eating disorder, headaches, hyperlipidemia was evaluated by telemedicine today.  Patient today reports she is currently doing well with regards to her mood.  She reports work is going well.  She stays busy.  She does struggle with a headache and is currently following up with neurology for the same.  She reports she is waiting for Botox injection to get approved by her health insurance plan.  Denies any binging on food.  Reports appetite is fair.  She denies any suicidality or homicidality.  Patient denies any other concerns today.  Visit Diagnosis:    ICD-10-CM   1. MDD (major depressive disorder), recurrent, in full remission ([place])  F33.42 buPROPion (WELLBUTRIN XL) 300 MG 24 hr tablet  2. Binge eating disorder  F50.81 busPIRone  (BUSPAR) 15 MG tablet    Past Psychiatric History: I have reviewed past psychiatric history from my progress note on 01/30/2018.  Past trials of Valium, Wellbutrin, BuSpar, Ritalin, Depakote, Vyvanse  Past Medical History:  Past Medical History:  Diagnosis Date  . Anxiety   . Depression   . Dysplastic nevus 10/03/2019   L lower back paraspinal mod to severe (shave removal)  . Squamous cell carcinoma of skin 09/26/2019   SCCIS - L prox lat pretibial     Past Surgical History:  Procedure Laterality Date  . BREAST BIOPSY Left 2008   neg    Family Psychiatric History: I have reviewed family psychiatric history from my progress note on 01/30/2018  Family History:  Family History  Adopted: Yes    Social History: Reviewed social history from my progress note on 01/30/2018 Social History   Socioeconomic History  . Marital status: Single    Spouse name: Not on file  . Number of children: 0  . Years of education: Not on file  . Highest education level: Bachelor's degree (e.g., BA, AB, BS)  Occupational History  . Not on file  Tobacco Use  . Smoking status: Never Smoker  . Smokeless tobacco: Never Used  Vaping Use  . Vaping Use: Never used  Substance and Sexual Activity  . Alcohol use: Yes    Alcohol/week: 2.0 standard drinks    Types: 2 Cans of beer per week  . Drug use: Never  . Sexual activity: Not Currently  Other Topics Concern  . Not on file  Social History Narrative  .  Not on file   Social Determinants of Health   Financial Resource Strain: Not on file  Food Insecurity: Not on file  Transportation Needs: Not on file  Physical Activity: Not on file  Stress: Not on file  Social Connections: Not on file    Allergies: No Known Allergies  Metabolic Disorder Labs: No results found for: HGBA1C, MPG No results found for: PROLACTIN No results found for: CHOL, TRIG, HDL, CHOLHDL, VLDL, LDLCALC No results found for: TSH  Therapeutic Level Labs: No results found  for: LITHIUM No results found for: VALPROATE No components found for:  CBMZ  Current Medications: Current Outpatient Medications  Medication Sig Dispense Refill  . Acetaminophen-Codeine 300-30 MG tablet Take 1 tablet by mouth every 4 (four) hours as needed.    Marland Kitchen buPROPion (WELLBUTRIN XL) 300 MG 24 hr tablet Take 1 tablet (300 mg total) by mouth daily. 90 tablet 1  . busPIRone (BUSPAR) 15 MG tablet Take 1 tablet (15 mg total) by mouth 3 (three) times daily. 270 tablet 0  . butalbital-aspirin-caffeine (FIORINAL) 50-325-40 MG capsule Take 1 capsule by mouth every 4 (four) hours as needed.    . Calcium Carbonate-Vitamin D 600-200 MG-UNIT TABS Take 600 mg by mouth.    . cetirizine (ZYRTEC) 10 MG tablet Take by mouth.    . Coenzyme Q10 10 MG capsule Take by mouth.    Marland Kitchen COVID-19 Specimen Collection KIT See admin instructions. for testing    . [facility] PURELAX 17 GM/SCOOP powder TAKE AS DIRECTED FOR COLONIC PREP.    Marland Kitchen cyclobenzaprine (FLEXERIL) 5 MG tablet Take 5 mg by mouth at bedtime as needed.    . divalproex (DEPAKOTE) 250 MG DR tablet Take 250 mg by mouth 3 (three) times daily.    Marland Kitchen gabapentin (NEURONTIN) 400 MG capsule Take 400 mg by mouth 3 (three) times daily.    . mupirocin ointment (BACTROBAN) 2 % Apply 1 application topically daily. With dressing changes 22 g 0  . polyethylene glycol powder (GLYCOLAX/MIRALAX) 17 GM/SCOOP powder Take as directed for colonic prep.    . simvastatin (ZOCOR) 20 MG tablet Take 20 mg by mouth daily.    Marland Kitchen topiramate (TOPAMAX) 100 MG tablet Take by mouth.    . traMADol (ULTRAM) 50 MG tablet Take by mouth.    . Botulinum Toxin Type A (BOTOX) 200 units SOLR INJECT 200 UNITS  INTRAMUSCULARLY EVERY 3  MONTHS (GIVEN AT MD OFFICE, DISCARD UNUSED) (Patient not taking: Reported on 08/21/2020)    . Butalbital-APAP-Caffeine 50-325-40 MG capsule Take by mouth. (Patient not taking: Reported on 08/21/2020)    . calcium citrate-vitamin D (CITRACAL+D) 315-200 MG-UNIT tablet Take by  mouth. (Patient not taking: Reported on 08/21/2020)    . naratriptan (AMERGE) 2.5 MG tablet Take 2.5 mg by mouth daily as needed. (Patient not taking: Reported on 08/21/2020)    . predniSONE (DELTASONE) 10 MG tablet Take by mouth. (Patient not taking: Reported on 08/21/2020)    . predniSONE (DELTASONE) 10 MG tablet 6 day taper - Take as directed (Patient not taking: Reported on 08/21/2020)    . prochlorperazine (COMPAZINE) 5 MG tablet Take 5 mg by mouth every 6 (six) hours as needed. (Patient not taking: Reported on 08/21/2020)    . Rimegepant Sulfate 75 MG TBDP Take by mouth. (Patient not taking: Reported on 08/21/2020)    . traMADol (ULTRAM) 50 MG tablet Take by mouth. (Patient not taking: Reported on 08/21/2020)    . zolmitriptan (ZOMIG-ZMT) 5 MG disintegrating tablet Take by  mouth. (Patient not taking: Reported on 08/21/2020)     No current facility-administered medications for this visit.     Musculoskeletal: Strength & Muscle Tone: UTA Gait & Station: UTA Patient leans: N/A  Psychiatric Specialty Exam: Review of Systems  Neurological: Positive for headaches.  All other systems reviewed and are negative.   There were no vitals taken for this visit.There is no height or weight on file to calculate BMI.  General Appearance: Casual  Eye Contact:  Fair  Speech:  Clear and Coherent  Volume:  Normal  Mood:  Euthymic  Affect:  Congruent  Thought Process:  Goal Directed and Descriptions of Associations: Intact  Orientation:  Full (Time, Place, and Person)  Thought Content: Logical   Suicidal Thoughts:  No  Homicidal Thoughts:  No  Memory:  Immediate;   Fair Recent;   Fair Remote;   Fair  Judgement:  Fair  Insight:  Fair  Psychomotor Activity:  Normal  Concentration:  Concentration: Fair and Attention Span: Fair  Recall:  [employer] of Knowledge: Fair  Language: Fair  Akathisia:  No  Handed:  Right  AIMS (if indicated): UTA  Assets:  Communication Skills Desire for  Improvement Housing Social Support  ADL's:  Intact  Cognition: WNL  Sleep:  Fair   Screenings: PHQ2-9   Flowsheet Row Video Visit from 08/21/2020 in [facility] Office Visit from 02/11/2019 in [facility]  PHQ-2 Total Score 0 1    Flowsheet Row Video Visit from 08/21/2020 in [facility] No Risk       Assessment and Plan: Barbara Russo is a 60 year old Caucasian female, single, lives in [place], has a history of depression, binge eating disorder was evaluated by telemedicine today.  Patient is currently stable on current medication regimen however does struggle with the headache and is currently following up with neurology.  Plan MDD in remission Wellbutrin XL 300 mg p.o. daily BuSpar 15 mg p.o. twice daily  Binge eating disorder-in remission Will monitor closely  Follow-up in clinic in the office 3 months from now.  This note was generated in part or whole with voice recognition software. Voice recognition is usually quite accurate but there are transcription errors that can and very often do occur. I apologize for any typographical errors that were not detected and corrected.        Ursula Alert, MD 08/21/2020, 1:00 PM

## 2020-09-23 ENCOUNTER — Encounter: Payer: Self-pay | Admitting: Dermatology

## 2020-09-23 ENCOUNTER — Other Ambulatory Visit: Payer: Self-pay

## 2020-09-23 ENCOUNTER — Ambulatory Visit: Payer: BC Managed Care – PPO | Admitting: Dermatology

## 2020-09-23 DIAGNOSIS — L820 Inflamed seborrheic keratosis: Secondary | ICD-10-CM | POA: Diagnosis not present

## 2020-09-23 DIAGNOSIS — Z86018 Personal history of other benign neoplasm: Secondary | ICD-10-CM | POA: Diagnosis not present

## 2020-09-23 DIAGNOSIS — L814 Other melanin hyperpigmentation: Secondary | ICD-10-CM

## 2020-09-23 DIAGNOSIS — L821 Other seborrheic keratosis: Secondary | ICD-10-CM

## 2020-09-23 DIAGNOSIS — Z1283 Encounter for screening for malignant neoplasm of skin: Secondary | ICD-10-CM | POA: Diagnosis not present

## 2020-09-23 DIAGNOSIS — D1800 Hemangioma unspecified site: Secondary | ICD-10-CM

## 2020-09-23 DIAGNOSIS — D229 Melanocytic nevi, unspecified: Secondary | ICD-10-CM

## 2020-09-23 DIAGNOSIS — L578 Other skin changes due to chronic exposure to nonionizing radiation: Secondary | ICD-10-CM

## 2020-09-23 DIAGNOSIS — L501 Idiopathic urticaria: Secondary | ICD-10-CM | POA: Diagnosis not present

## 2020-09-23 DIAGNOSIS — D692 Other nonthrombocytopenic purpura: Secondary | ICD-10-CM

## 2020-09-23 DIAGNOSIS — D485 Neoplasm of uncertain behavior of skin: Secondary | ICD-10-CM | POA: Diagnosis not present

## 2020-09-23 NOTE — Patient Instructions (Addendum)
If you have any questions or concerns for your doctor, please call our main line at [phone] and press option 4 to reach your doctor's medical assistant. If no one answers, please leave a voicemail as directed and we will return your call as soon as possible. Messages left after 4 pm will be answered the following business day.   You may also send us a message via MyChart. We typically respond to MyChart messages within 1-2 business days.  For prescription refills, please ask your pharmacy to contact our office. Our fax number is [phone].  If you have an urgent issue when the clinic is closed that cannot wait until the next business day, you can page your doctor at the number below.    Please note that while we do our best to be available for urgent issues outside of office hours, we are not available 24/7.   If you have an urgent issue and are unable to reach us, you may choose to seek medical care at your doctor's office, retail clinic, urgent care center, or emergency room.  If you have a medical emergency, please immediately call 911 or go to the emergency department.  Pager Numbers  - Dr. Kowalski: [phone]  - Dr. Moye: [phone]  - Dr. Stewart: [phone]  In the event of inclement weather, please call our main line at [phone] for an update on the status of any delays or closures.  Dermatology Medication Tips: Please keep the boxes that topical medications come in in order to help keep track of the instructions about where and how to use these. Pharmacies typically print the medication instructions only on the boxes and not directly on the medication tubes.   If your medication is too expensive, please contact our office at [phone] option 4 or send us a message through MyChart.   We are unable to tell what your co-pay for medications will be in advance as this is different depending on your insurance coverage. However, we may be able to find a  substitute medication at lower cost or fill out paperwork to get insurance to cover a needed medication.   If a prior authorization is required to get your medication covered by your insurance company, please allow us 1-2 business days to complete this process.  Drug prices often vary depending on where the prescription is filled and some pharmacies may offer cheaper prices.  The website www.goodrx.com contains coupons for medications through different pharmacies. The prices here do not account for what the cost may be with help from insurance (it may be cheaper with your insurance), but the website can give you the price if you did not use any insurance.  - You can print the associated coupon and take it with your prescription to the pharmacy.  - You may also stop by our office during regular business hours and pick up a GoodRx coupon card.  - If you need your prescription sent electronically to a different pharmacy, notify our office through [facility] MyChart or by phone at [phone] option 4.     Wound Care Instructions  1. Cleanse wound gently with soap and water once a day then pat dry with clean gauze. Apply a thing coat of Petrolatum (petroleum jelly, "Vaseline") over the wound (unless you have an allergy to this). We recommend that you use a new, sterile tube of Vaseline. Do not pick or remove scabs. Do not remove the yellow or white "healing tissue" from the base of the wound.    2. Cover the wound with fresh, clean, nonstick gauze and secure with paper tape. You may use Band-Aids in place of gauze and tape if the would is small enough, but would recommend trimming much of the tape off as there is often too much. Sometimes Band-Aids can irritate the skin.  3. You should call the office for your biopsy report after 1 week if you have not already been contacted.  4. If you experience any problems, such as abnormal amounts of bleeding, swelling, significant bruising, significant pain,  or evidence of infection, please call the office immediately.  5. FOR ADULT SURGERY PATIENTS: If you need something for pain relief you may take 1 extra strength Tylenol (acetaminophen) AND 2 Ibuprofen (200mg each) together every 4 hours as needed for pain. (do not take these if you are allergic to them or if you have a reason you should not take them.) Typically, you may only need pain medication for 1 to 3 days.     

## 2020-09-23 NOTE — Progress Notes (Signed)
Follow-Up Visit   Subjective  Barbara Russo is a 60 y.o. female who presents for the following: Annual Exam (Hx AK's, SCC, dysplastic nevus ). The patient presents for Total-Body Skin Exam (TBSE) for skin cancer screening and mole check.  The following portions of the chart were reviewed this encounter and updated as appropriate:   Tobacco  Allergies  Meds  Problems  Med Hx  Surg Hx  Fam Hx     Review of Systems:  No other skin or systemic complaints except as noted in HPI or Assessment and Plan.  Objective  Well appearing patient in no apparent distress; mood and affect are within normal limits.  A full examination was performed including scalp, head, eyes, ears, nose, lips, neck, chest, axillae, abdomen, back, buttocks, bilateral upper extremities, bilateral lower extremities, hands, feet, fingers, toes, fingernails, and toenails. All findings within normal limits unless otherwise noted below.  Objective  R lat canthus, R preauricular, L temple, L cheek (4): Erythematous keratotic or waxy stuck-on papule or plaque.   Objective  R lat breast: 0.6 cm irregular brown macule .  Objective  Trunk, extremities: Clear today but hx of chronic pruritus   Assessment & Plan  Inflamed seborrheic keratosis (4) R lat canthus, R preauricular, L temple, L cheek Destruction of lesion - R lat canthus, R preauricular, L temple, L cheek Complexity: simple   Destruction method: cryotherapy   Informed consent: discussed and consent obtained   Timeout:  patient name, date of birth, surgical site, and procedure verified Lesion destroyed using liquid nitrogen: Yes   Region frozen until ice ball extended beyond lesion: Yes   Outcome: patient tolerated procedure well with no complications   Post-procedure details: wound care instructions given    Neoplasm of uncertain behavior of skin R lat breast Epidermal / dermal shaving  Lesion diameter (cm):  0.6 Informed consent: discussed  and consent obtained   Timeout: patient name, date of birth, surgical site, and procedure verified   Procedure prep:  Patient was prepped and draped in usual sterile fashion Prep type:  Isopropyl alcohol Anesthesia: the lesion was anesthetized in a standard fashion   Anesthetic:  1% lidocaine w/ epinephrine 1-100,000 buffered w/ 8.4% NaHCO3 Instrument used: flexible razor blade   Hemostasis achieved with: pressure, aluminum chloride and electrodesiccation   Outcome: patient tolerated procedure well   Post-procedure details: sterile dressing applied and wound care instructions given   Dressing type: bandage and petrolatum    Specimen 1 - Surgical pathology Differential Diagnosis: D48.5 r/o dysplastic nevus  Check Margins: No 0.6 cm irregular brown macule .  Idiopathic urticaria -with dermatographia  Chronic and persistent Trunk, extremities Continue Zyrtec 15mg  po QD and  start Allegra 180mg  po QD.  Consider adding Singulair, and briefly  discussed Xolair injections.   Lentigines - Scattered tan macules - Due to sun exposure - Benign-appering, observe - Recommend daily broad spectrum sunscreen SPF 30+ to sun-exposed areas, reapply every 2 hours as needed. - Call for any changes  Seborrheic Keratoses - Stuck-on, waxy, tan-brown papules and/or plaques  - Benign-appearing - Discussed benign etiology and prognosis. - Observe - Call for any changes  Melanocytic Nevi - Tan-brown and/or pink-flesh-colored symmetric macules and papules - Benign appearing on exam today - Observation - Call clinic for new or changing moles - Recommend daily use of broad spectrum spf 30+ sunscreen to sun-exposed areas.   Hemangiomas - Red papules - Discussed benign nature - Observe - Call for any changes  Actinic Damage - Chronic condition, secondary to cumulative UV/sun exposure - diffuse scaly erythematous macules with underlying dyspigmentation - Recommend daily broad spectrum sunscreen  SPF 30+ to sun-exposed areas, reapply every 2 hours as needed.  - Staying in the shade or wearing long sleeves, sun glasses (UVA+UVB protection) and wide brim hats (4-inch brim around the entire circumference of the hat) are also recommended for sun protection.  - Call for new or changing lesions.  History of Squamous Cell Carcinoma of the Skin - No evidence of recurrence today - No lymphadenopathy - Recommend regular full body skin exams - Recommend daily broad spectrum sunscreen SPF 30+ to sun-exposed areas, reapply every 2 hours as needed.  - Call if any new or changing lesions are noted between office visits  History of Dysplastic Nevus - L low back paraspinal (mod-severe) - No evidence of recurrence today - Recommend regular full body skin exams - Recommend daily broad spectrum sunscreen SPF 30+ to sun-exposed areas, reapply every 2 hours as needed.  - Call if any new or changing lesions are noted between office visits  Purpura - Chronic; persistent and recurrent.  Treatable, but not curable. - Violaceous macules and patches - Benign - Related to trauma, age, sun damage and/or use of blood thinners, chronic use of topical and/or oral steroids - Observe - Can use OTC arnica containing moisturizer such as Dermend Bruise Formula if desired - Call for worsening or other concerns  Skin cancer screening performed today.  Return in about 1 year (around 09/23/2021) for TBSE.  Luther Redo, CMA, am acting as scribe for Sarina Ser, MD .  Documentation: I have reviewed the above documentation for accuracy and completeness, and I agree with the above.  Sarina Ser, MD

## 2020-09-28 ENCOUNTER — Telehealth: Payer: Self-pay

## 2020-09-28 NOTE — Telephone Encounter (Signed)
Patient informed of pathology results 

## 2020-09-28 NOTE — Telephone Encounter (Signed)
-----   Message from Ralene Bathe, MD sent at 09/24/2020  6:57 PM EDT ----- Diagnosis Skin , right lateral breast DYSPLASTIC JUNCTIONAL LENTIGINOUS NEVUS WITH MODERATE ATYPIA, LATERAL MARGIN INVOLVED  Dysplastic Moderate Recheck next visit 11/26/20

## 2020-11-17 ENCOUNTER — Other Ambulatory Visit: Payer: Self-pay

## 2020-11-17 ENCOUNTER — Telehealth (INDEPENDENT_AMBULATORY_CARE_PROVIDER_SITE_OTHER): Payer: BC Managed Care – PPO | Admitting: Psychiatry

## 2020-11-17 ENCOUNTER — Encounter: Payer: Self-pay | Admitting: Psychiatry

## 2020-11-17 DIAGNOSIS — F3342 Major depressive disorder, recurrent, in full remission: Secondary | ICD-10-CM | POA: Diagnosis not present

## 2020-11-17 DIAGNOSIS — F5081 Binge eating disorder: Secondary | ICD-10-CM | POA: Diagnosis not present

## 2020-11-17 MED ORDER — BUSPIRONE HCL 15 MG PO TABS: 15.0000 mg | ORAL_TABLET | Freq: Three times a day (TID) | ORAL | 0 refills | Status: DC

## 2020-11-17 NOTE — Progress Notes (Signed)
Virtual Visit via Video Note  I connected with Barbara Russo on 11/17/20 at 10:30 AM EDT by a video enabled telemedicine application and verified that I am speaking with the correct person using two identifiers.  Location Provider Location :Office Patient Location : Home  Participants: Patient , Provider  I discussed the limitations of evaluation and management by telemedicine and the availability of in person appointments. The patient expressed understanding and agreed to proceed.    I discussed the assessment and treatment plan with the patient. The patient was provided an opportunity to ask questions and all were answered. The patient agreed with the plan and demonstrated an understanding of the instructions.   The patient was advised to call back or seek an in-person evaluation if the symptoms worsen or if the condition fails to improve as anticipated. [facility] [place] MD OP Progress Note  11/17/2020 1:52 PM Barbara Russo  MRN:  [id number]  Chief Complaint:  Chief Complaint    Follow-up; Depression     HPI: Barbara Russo is a 60 year old Caucasian female, single, employed, lives in [place], has a history of MDD, binge eating disorder, headaches, hyperlipidemia was evaluated by telemedicine today.  Patient today reports she is currently having anxiety about her new headache medication prescribed by her neurologist.  She reports she was asked to start Emgality for her headache.  She reports she is worried about starting a new medication for her headaches.  She reports she noticed that Emgality has adverse effect of depression and other side effects listed.  She reports that does worry her.    Patient currently denies any headaches and reports she is doing well today.  She does have medications including tramadol available as needed for her headaches.  She reports other than her worry about this new medication overall she has been doing okay.  She denies any significant  depressive symptoms.  She denies any suicidality, homicidality or perceptual disturbances.  Patient is compliant on medications and denies side effects.  She reports work is going well.  Patient denies any other concerns today.  Visit Diagnosis:    ICD-10-CM   1. MDD (major depressive disorder), recurrent, in full remission ([place])  F33.42   2. Binge eating disorder  F50.81 busPIRone (BUSPAR) 15 MG tablet    Past Psychiatric History: I have reviewed past psychiatric history from progress note on 01/30/2018.  Past trials of Valium, Wellbutrin, BuSpar, Ritalin, Depakote, Vyvanse  Past Medical History:  Past Medical History:  Diagnosis Date  . Actinic keratosis   . Anxiety   . Depression   . Dysplastic nevus 10/03/2019   L lower back paraspinal mod to severe (shave removal)  . Dysplastic nevus 09/23/2020   R lat breast - moderate  . Squamous cell carcinoma of skin 09/26/2019   SCCIS - L prox lat pretibial     Past Surgical History:  Procedure Laterality Date  . BREAST BIOPSY Left 2008   neg    Family Psychiatric History: Reviewed family psychiatric history from progress note on 01/30/2018.  Family History:  Family History  Adopted: Yes    Social History: Reviewed social history from progress note on 01/30/2018. Social History   Socioeconomic History  . Marital status: Single    Spouse name: Not on file  . Number of children: 0  . Years of education: Not on file  . Highest education level: Bachelor's degree (e.g., BA, AB, BS)  Occupational History  . Not on file  Tobacco Use  . Smoking status: Never  Smoker  . Smokeless tobacco: Never Used  Vaping Use  . Vaping Use: Never used  Substance and Sexual Activity  . Alcohol use: Yes    Alcohol/week: 2.0 standard drinks    Types: 2 Cans of beer per week  . Drug use: Never  . Sexual activity: Not Currently  Other Topics Concern  . Not on file  Social History Narrative  . Not on file   Social Determinants of Health    Financial Resource Strain: Not on file  Food Insecurity: Not on file  Transportation Needs: Not on file  Physical Activity: Not on file  Stress: Not on file  Social Connections: Not on file    Allergies: No Known Allergies  Metabolic Disorder Labs: No results found for: HGBA1C, MPG No results found for: PROLACTIN No results found for: CHOL, TRIG, HDL, CHOLHDL, VLDL, LDLCALC No results found for: TSH  Therapeutic Level Labs: No results found for: LITHIUM No results found for: VALPROATE No components found for:  CBMZ  Current Medications: Current Outpatient Medications  Medication Sig Dispense Refill  . buPROPion (WELLBUTRIN XL) 300 MG 24 hr tablet Take 1 tablet (300 mg total) by mouth daily. 90 tablet 1  . Calcium Carbonate-Vitamin D 600-200 MG-UNIT TABS Take 600 mg by mouth.    . cetirizine (ZYRTEC) 10 MG tablet Take by mouth.    . cyclobenzaprine (FLEXERIL) 5 MG tablet Take 5 mg by mouth at bedtime as needed.    . divalproex (DEPAKOTE) 250 MG DR tablet Take 250 mg by mouth 3 (three) times daily.    Marland Kitchen gabapentin (NEURONTIN) 400 MG capsule Take 400 mg by mouth 3 (three) times daily.    . mupirocin ointment (BACTROBAN) 2 % Apply 1 application topically daily. With dressing changes 22 g 0  . simvastatin (ZOCOR) 20 MG tablet Take 20 mg by mouth daily.    Marland Kitchen topiramate (TOPAMAX) 100 MG tablet Take 100 mg by mouth 2 (two) times daily.    . traMADol (ULTRAM) 50 MG tablet Take by mouth.    . Acetaminophen-Codeine 300-30 MG tablet Take 1 tablet by mouth every 4 (four) hours as needed. (Patient not taking: Reported on 11/17/2020)    . ASCOMP-CODEINE 50-325-40-30 MG capsule Take 1 capsule by mouth every 4 (four) hours as needed. (Patient not taking: Reported on 11/17/2020)    . Botulinum Toxin Type A (BOTOX) 200 units SOLR  (Patient not taking: Reported on 11/17/2020)    . busPIRone (BUSPAR) 15 MG tablet Take 1 tablet (15 mg total) by mouth 3 (three) times daily. 270 tablet 0  .  Butalbital-APAP-Caffeine 50-325-40 MG capsule Take by mouth. (Patient not taking: Reported on 11/17/2020)    . butalbital-aspirin-caffeine (FIORINAL) 50-325-40 MG capsule Take 1 capsule by mouth every 4 (four) hours as needed. (Patient not taking: Reported on 11/17/2020)    . calcium citrate-vitamin D (CITRACAL+D) 315-200 MG-UNIT tablet Take by mouth. (Patient not taking: Reported on 11/17/2020)    . Coenzyme Q10 10 MG capsule Take by mouth. (Patient not taking: Reported on 11/17/2020)    . COVID-19 Specimen Collection KIT See admin instructions. for testing (Patient not taking: Reported on 11/17/2020)    . [employer] PURELAX 17 GM/SCOOP powder TAKE AS DIRECTED FOR COLONIC PREP. (Patient not taking: Reported on 11/17/2020)    . Galcanezumab-gnlm 120 MG/ML SOAJ Inject into the skin. (Patient not taking: Reported on 11/17/2020)    . polyethylene glycol powder (GLYCOLAX/MIRALAX) 17 GM/SCOOP powder Take as directed for colonic prep. (Patient not taking:  Reported on 11/17/2020)     No current facility-administered medications for this visit.     Musculoskeletal: Strength & Muscle Tone: UTA Gait & Station: UTA Patient leans: N/A  Psychiatric Specialty Exam: Review of Systems  Psychiatric/Behavioral: The patient is nervous/anxious.   All other systems reviewed and are negative.   There were no vitals taken for this visit.There is no height or weight on file to calculate BMI.  General Appearance: Casual  Eye Contact:  Fair  Speech:  Normal Rate  Volume:  Normal  Mood:  Anxious  Affect:  Congruent  Thought Process:  Goal Directed and Descriptions of Associations: Intact  Orientation:  Full (Time, Place, and Person)  Thought Content: Logical   Suicidal Thoughts:  No  Homicidal Thoughts:  No  Memory:  Immediate;   Fair Recent;   Fair Remote;   Fair  Judgement:  Fair  Insight:  Fair  Psychomotor Activity:  Normal  Concentration:  Concentration: Fair and Attention Span: Fair  Recall:  [employer] of Knowledge:  Fair  Language: Fair  Akathisia:  No  Handed:  Right  AIMS (if indicated): UTA  Assets:  Communication Skills Desire for Improvement Housing Social Support  ADL's:  Intact  Cognition: WNL  Sleep:  Fair   Screenings: PHQ2-9   Flowsheet Row Video Visit from 11/17/2020 in [facility] Video Visit from 08/21/2020 in [facility] Office Visit from 02/11/2019 in [facility]  PHQ-2 Total Score 1 0 1    Flowsheet Row Video Visit from 08/21/2020 in [facility] No Risk       Assessment and Plan: Hellena Pridgen is a 60 year old Caucasian female, single, lives in [place], has a history of depression, binge eating disorder, chronic headaches was evaluated by telemedicine today.  Patient does have anxiety about her new treatment for headaches otherwise doing well.  Plan MDD in remission Wellbutrin XL 300 mg p.o. daily BuSpar 15 mg p.o. twice daily   Binge eating disorder-in remission Will monitor closely  Provided reassurance.  Advised patient to monitor her symptoms closely and let writer know if she has any worsening depression once she started her new medication for headaches.  Patient advised to follow-up in clinic in 4 weeks or sooner if needed.    This note was generated in part or whole with voice recognition software. Voice recognition is usually quite accurate but there are transcription errors that can and very often do occur. I apologize for any typographical errors that were not detected and corrected.      Ursula Alert, MD 11/17/2020, 1:52 PM

## 2020-11-26 ENCOUNTER — Ambulatory Visit: Payer: BC Managed Care – PPO | Admitting: Dermatology

## 2020-12-24 ENCOUNTER — Telehealth: Payer: BC Managed Care – PPO | Admitting: Psychiatry

## 2021-01-07 ENCOUNTER — Other Ambulatory Visit: Payer: Self-pay

## 2021-01-07 ENCOUNTER — Encounter: Payer: Self-pay | Admitting: Psychiatry

## 2021-01-07 ENCOUNTER — Telehealth (INDEPENDENT_AMBULATORY_CARE_PROVIDER_SITE_OTHER): Payer: BC Managed Care – PPO | Admitting: Psychiatry

## 2021-01-07 DIAGNOSIS — F50819 Binge eating disorder, unspecified: Secondary | ICD-10-CM

## 2021-01-07 DIAGNOSIS — F5081 Binge eating disorder: Secondary | ICD-10-CM

## 2021-01-07 DIAGNOSIS — F3342 Major depressive disorder, recurrent, in full remission: Secondary | ICD-10-CM

## 2021-01-07 MED ORDER — BUPROPION HCL ER (XL) 300 MG PO TB24: 300.0000 mg | ORAL_TABLET | Freq: Every day | ORAL | 1 refills | Status: DC

## 2021-01-07 NOTE — Progress Notes (Signed)
Virtual Visit via Video Note  I connected with Barbara Russo on 01/07/21 at 11:00 AM EDT by a video enabled telemedicine application and verified that I am speaking with the correct person using two identifiers.  Location Provider Location : [facility] Patient Location : Home  Participants: Patient , Provider    I discussed the limitations of evaluation and management by telemedicine and the availability of in person appointments. The patient expressed understanding and agreed to proceed.    I discussed the assessment and treatment plan with the patient. The patient was provided an opportunity to ask questions and all were answered. The patient agreed with the plan and demonstrated an understanding of the instructions.   The patient was advised to call back or seek an in-person evaluation if the symptoms worsen or if the condition fails to improve as anticipated.   [place] MD OP Progress Note  01/07/2021 11:18 AM Barbara Russo  MRN:  [id number]  Chief Complaint:  Chief Complaint   Follow-up; Depression; Anxiety    HPI: Barbara Russo is a 60 year old Caucasian female, single, employed, lives in [place], has a history of MDD, binge eating disorder, headaches, hyperlipidemia was evaluated by telemedicine today.  Patient today reports her headaches are currently more under control on the Emgality.  She is tolerating the medication well.  Patient reports she currently does not have any side effects like worsening depression on the Emgality.  She was worried about that when she started this new medication.  She reports her anxiety is under control.  She reports work is going well.  She reports sleep is good.  She reports appetite is fair.  She is compliant on Wellbutrin and BuSpar.  Denies side effects.  Patient denies any suicidality, homicidality or perceptual disturbances.  Patient denies any other concerns today.  Visit Diagnosis:    ICD-10-CM   1. MDD (major  depressive disorder), recurrent, in full remission ([place])  F33.42 buPROPion (WELLBUTRIN XL) 300 MG 24 hr tablet    2. Binge eating disorder  F50.81       Past Psychiatric History: Reviewed past psychiatric history from progress note on 01/30/2018.  Past trials of Valium, Wellbutrin, BuSpar, Ritalin, Depakote, Vyvanse  Past Medical History:  Past Medical History:  Diagnosis Date   Actinic keratosis    Anxiety    Depression    Dysplastic nevus 10/03/2019   L lower back paraspinal mod to severe (shave removal)   Dysplastic nevus 09/23/2020   R lat breast - moderate   Squamous cell carcinoma of skin 09/26/2019   SCCIS - L prox lat pretibial     Past Surgical History:  Procedure Laterality Date   BREAST BIOPSY Left 2008   neg    Family Psychiatric History: I have reviewed family psychiatric history from progress note on 01/30/2018.  Family History:  Family History  Adopted: Yes    Social History: Reviewed social history from progress note on 01/30/2018. Social History   Socioeconomic History   Marital status: Single    Spouse name: Not on file   Number of children: 0   Years of education: Not on file   Highest education level: Bachelor's degree (e.g., BA, AB, BS)  Occupational History   Not on file  Tobacco Use   Smoking status: Never   Smokeless tobacco: Never  Vaping Use   Vaping Use: Never used  Substance and Sexual Activity   Alcohol use: Yes    Alcohol/week: 2.0 standard drinks    Types: 2 Cans  of beer per week   Drug use: Never   Sexual activity: Not Currently  Other Topics Concern   Not on file  Social History Narrative   Not on file   Social Determinants of Health   Financial Resource Strain: Not on file  Food Insecurity: Not on file  Transportation Needs: Not on file  Physical Activity: Not on file  Stress: Not on file  Social Connections: Not on file    Allergies: No Known Allergies  Metabolic Disorder Labs: No results found for: HGBA1C,  MPG No results found for: PROLACTIN No results found for: CHOL, TRIG, HDL, CHOLHDL, VLDL, LDLCALC No results found for: TSH  Therapeutic Level Labs: No results found for: LITHIUM No results found for: VALPROATE No components found for:  CBMZ  Current Medications: Current Outpatient Medications  Medication Sig Dispense Refill   simvastatin (ZOCOR) 20 MG tablet Take 1 tablet by mouth daily.     Acetaminophen-Codeine 300-30 MG tablet Take 1 tablet by mouth every 4 (four) hours as needed. (Patient not taking: Reported on 11/17/2020)     ASCOMP-CODEINE 50-325-40-30 MG capsule Take 1 capsule by mouth every 4 (four) hours as needed. (Patient not taking: Reported on 11/17/2020)     Botulinum Toxin Type A (BOTOX) 200 units SOLR  (Patient not taking: Reported on 11/17/2020)     buPROPion (WELLBUTRIN XL) 300 MG 24 hr tablet Take 1 tablet (300 mg total) by mouth daily. 90 tablet 1   busPIRone (BUSPAR) 15 MG tablet Take 1 tablet (15 mg total) by mouth 3 (three) times daily. 270 tablet 0   Butalbital-APAP-Caffeine 50-325-40 MG capsule Take by mouth. (Patient not taking: Reported on 11/17/2020)     butalbital-aspirin-caffeine (FIORINAL) 50-325-40 MG capsule Take 1 capsule by mouth every 4 (four) hours as needed. (Patient not taking: Reported on 11/17/2020)     Calcium Carbonate-Vitamin D 600-200 MG-UNIT TABS Take 600 mg by mouth.     calcium citrate-vitamin D (CITRACAL+D) 315-200 MG-UNIT tablet Take by mouth. (Patient not taking: Reported on 11/17/2020)     cetirizine (ZYRTEC) 10 MG tablet Take by mouth.     cetirizine (ZYRTEC) 10 MG tablet Take by mouth.     Coenzyme Q10 10 MG capsule Take by mouth. (Patient not taking: Reported on 11/17/2020)     COVID-19 Specimen Collection KIT See admin instructions. for testing (Patient not taking: Reported on 11/17/2020)     [employer] PURELAX 17 GM/SCOOP powder TAKE AS DIRECTED FOR COLONIC PREP. (Patient not taking: Reported on 11/17/2020)     cyclobenzaprine (FLEXERIL) 5 MG tablet Take  5 mg by mouth at bedtime as needed.     divalproex (DEPAKOTE) 250 MG DR tablet Take 250 mg by mouth 3 (three) times daily.     gabapentin (NEURONTIN) 400 MG capsule Take 400 mg by mouth 3 (three) times daily.     Galcanezumab-gnlm 120 MG/ML SOAJ Inject into the skin. (Patient not taking: Reported on 11/17/2020)     mupirocin ointment (BACTROBAN) 2 % Apply 1 application topically daily. With dressing changes 22 g 0   polyethylene glycol powder (GLYCOLAX/MIRALAX) 17 GM/SCOOP powder Take as directed for colonic prep. (Patient not taking: Reported on 11/17/2020)     simvastatin (ZOCOR) 20 MG tablet Take 20 mg by mouth daily.     topiramate (TOPAMAX) 100 MG tablet Take 100 mg by mouth 2 (two) times daily.     traMADol (ULTRAM) 50 MG tablet Take by mouth.     No current facility-administered medications for  this visit.     Musculoskeletal: Strength & Muscle Tone:  UTA Gait & Station:  UTA Patient leans: N/A  Psychiatric Specialty Exam: Review of Systems  Psychiatric/Behavioral:  Negative for dysphoric mood, hallucinations, self-injury, sleep disturbance and suicidal ideas. The patient is nervous/anxious. The patient is not hyperactive.   All other systems reviewed and are negative.  There were no vitals taken for this visit.There is no height or weight on file to calculate BMI.  General Appearance: Casual  Eye Contact:  Fair  Speech:  Clear and Coherent  Volume:  Normal  Mood:  Anxious Coping  Affect:  Congruent  Thought Process:  Goal Directed and Descriptions of Associations: Intact  Orientation:  Full (Time, Place, and Person)  Thought Content: Logical   Suicidal Thoughts:  No  Homicidal Thoughts:  No  Memory:  Immediate;   Fair Recent;   Fair Remote;   Fair  Judgement:  Good  Insight:  Fair  Psychomotor Activity:  Normal  Concentration:  Concentration: Fair and Attention Span: Fair  Recall:  [employer] of Knowledge: Fair  Language: Fair  Akathisia:  No  Handed:  Right  AIMS  (if indicated): not done  Assets:  Communication Skills Desire for Improvement Housing Social Support  ADL's:  Intact  Cognition: WNL  Sleep:  Fair   Screenings: PHQ2-9    Flowsheet Row Video Visit from 11/17/2020 in [facility] Video Visit from 08/21/2020 in [facility] Office Visit from 02/11/2019 in [facility]  PHQ-2 Total Score 1 0 1      Flowsheet Row Video Visit from 08/21/2020 in [facility] No Risk        Assessment and Plan: Barbara Russo is a 60 year old Caucasian female, single, lives in [place], has a history of depression, binge eating disorder, chronic headaches was evaluated by telemedicine today.  Patient is currently stable.  Plan as noted below.  Plan MDD in remission Wellbutrin XL 300 mg p.o. daily BuSpar 15 mg p.o. twice daily  Binge eating disorder in remission We will monitor closely  Long-term plan is to taper off to monotherapy.  If patient continues to do well we will consider reducing the dosage of Wellbutrin or BuSpar gradually.  This was discussed with patient.  Follow-up in clinic in 3 months or sooner if needed.  This note was generated in part or whole with voice recognition software. Voice recognition is usually quite accurate but there are transcription errors that can and very often do occur. I apologize for any typographical errors that were not detected and corrected.       Ursula Alert, MD 01/08/2021, 5:32 PM

## 2021-01-28 ENCOUNTER — Other Ambulatory Visit: Payer: Self-pay

## 2021-01-28 ENCOUNTER — Ambulatory Visit (INDEPENDENT_AMBULATORY_CARE_PROVIDER_SITE_OTHER): Payer: BC Managed Care – PPO | Admitting: Dermatology

## 2021-01-28 DIAGNOSIS — L578 Other skin changes due to chronic exposure to nonionizing radiation: Secondary | ICD-10-CM

## 2021-01-28 DIAGNOSIS — L820 Inflamed seborrheic keratosis: Secondary | ICD-10-CM | POA: Diagnosis not present

## 2021-01-28 DIAGNOSIS — L988 Other specified disorders of the skin and subcutaneous tissue: Secondary | ICD-10-CM

## 2021-01-28 NOTE — Progress Notes (Signed)
Follow-Up Visit   Subjective  Barbara Russo is a 60 y.o. female who presents for the following: lip fillers (Patient would like to have lip fillers. She reports she has had done about a year and half ago. She would like to discuss possible filler at cheek areas. ).  The following portions of the chart were reviewed this encounter and updated as appropriate:  Tobacco  Allergies  Meds  Problems  Med Hx  Surg Hx  Fam Hx     Objective  Well appearing patient in no apparent distress; mood and affect are within normal limits.  A focused examination was performed including face. Relevant physical exam findings are noted in the Assessment and Plan.  lips and face Rhytides and volume loss.                          Left Lateral Canthus x 1 Erythematous keratotic or waxy stuck-on papule or plaque.   Assessment & Plan  Elastosis of skin lips and face  Start Will prescribe [employer] Anti-Aging Tretinoin 0.025%/Niacinamide/Vitamin C/Vitamin E/Turmeric/Resveratrol with Hyaluronic Acid. Apply pea sized amount nightly to the entire face.  The patient was advised this is not covered by insurance since it is made by a compounding pharmacy. They will receive an email to check out and the medication will be mailed to their home.   Topical retinoid medications like tretinoin can cause dryness and irritation when first started. Only apply a pea-sized amount to the entire affected area. Avoid applying it around the eyes, edges of mouth and creases at the nose. If you experience irritation, use a good moisturizer first and/or apply the medicine less often. If you are doing well with the medicine, you can increase how often you use it until you are applying every night. Be careful with sun protection while using this medication as it can make you sensitive to the sun. This medicine should not be used by pregnant women.      Filling material injection - lips and  face Prior to the procedure, the patient's past medical history, allergies and the rare but potential risks and complications were reviewed with the patient and a signed consent was obtained. Pre and post-treatment care was discussed and instructions provided.  Location: lips  Filler TypeOlena Heckle Lot : [id number] exp 2022-01-18  Procedure: Lidocaine-tetracaine ointment was applied to treatment areas to achieve good local anesthesia. The area was prepped thoroughly with Puracyn. After introducing the needle into the desired treatment area, the syringe plunger was drawn back to ensure there was no flash of blood prior to injecting the filler in order to minimize risk of intravascular injection and vascular occlusion. After injection of the filler, the treated areas were cleansed and iced to reduce swelling. Post-treatment instructions were reviewed with the patient.       Patient tolerated the procedure well. The patient will call with any problems, questions or concerns prior to their next appointment.   Inflamed seborrheic keratosis Left Lateral Canthus x 1  Destruction of lesion - Left Lateral Canthus x 1 Complexity: simple   Destruction method: cryotherapy   Informed consent: discussed and consent obtained   Timeout:  patient name, date of birth, surgical site, and procedure verified Lesion destroyed using liquid nitrogen: Yes   Region frozen until ice ball extended beyond lesion: Yes   Outcome: patient tolerated procedure well with no complications   Post-procedure details: wound care instructions given  Actinic Damage - chronic, secondary to cumulative UV radiation exposure/sun exposure over time - diffuse scaly erythematous macules with underlying dyspigmentation - Recommend daily broad spectrum sunscreen SPF 30+ to sun-exposed areas, reapply every 2 hours as needed.  - Recommend staying in the shade or wearing long sleeves, sun glasses (UVA+UVB protection) and wide  brim hats (4-inch brim around the entire circumference of the hat). - Call for new or changing lesions.  Return for Follow up as scheduled. IRuthell Rummage, CMA, am acting as scribe for Sarina Ser, MD. Documentation: I have reviewed the above documentation for accuracy and completeness, and I agree with the above.  Sarina Ser, MD

## 2021-01-28 NOTE — Patient Instructions (Signed)
Cryotherapy Aftercare  Wash gently with soap and water everyday.   Apply Vaseline and Band-Aid daily until healed.     Instructions for [facility] Medications  One or more of your medications was sent to the [facility] mail order compounding pharmacy. You will receive an email from them and can purchase the medicine through that link. It will then be mailed to your home at the address you confirmed. If for any reason you do not receive an email from them, please check your spam folder. If you still do not find the email, please let us know. [facility] phone number is [phone].   If you have any questions or concerns for your doctor, please call our main line at [phone] and press option 4 to reach your doctor's medical assistant. If no one answers, please leave a voicemail as directed and we will return your call as soon as possible. Messages left after 4 pm will be answered the following business day.   You may also send [place] a message via [place]. We typically respond to MyChart messages within 1-2 business days.  For prescription refills, please ask your pharmacy to contact our office. Our fax number is [phone].  If you have an urgent issue when the clinic is closed that cannot wait until the next business day, you can page your doctor at the number below.    Please note that while we do our best to be available for urgent issues outside of office hours, we are not available 24/7.   If you have an urgent issue and are unable to reach [place], you may choose to seek medical care at your doctor's office, retail clinic, urgent care center, or emergency room.  If you have a medical emergency, please immediately call 911 or go to the emergency department.  Pager Numbers  - Dr. Nehemiah Massed: [phone]  - Dr. Laurence Ferrari: [phone]  - Dr. Nicole Kindred: [phone]  In the event of inclement weather, please call our main line at [phone] for an update on the status of any  delays or closures.  Dermatology Medication Tips: Please keep the boxes that topical medications come in in order to help keep track of the instructions about where and how to use these. Pharmacies typically print the medication instructions only on the boxes and not directly on the medication tubes.   If your medication is too expensive, please contact our office at [phone] option 4 or send [place] a message through [place].   We are unable to tell what your co-pay for medications will be in advance as this is different depending on your insurance coverage. However, we may be able to find a substitute medication at lower cost or fill out paperwork to get insurance to cover a needed medication.   If a prior authorization is required to get your medication covered by your insurance company, please allow [place] 1-2 business days to complete this process.  Drug prices often vary depending on where the prescription is filled and some pharmacies may offer cheaper prices.  The website www.goodrx.com contains coupons for medications through different pharmacies. The prices here do not account for what the cost may be with help from insurance (it may be cheaper with your insurance), but the website can give you the price if you did not use any insurance.  - You can print the associated coupon and take it with your prescription to the pharmacy.  - You may also stop by our office during regular business hours  and pick up a GoodRx coupon card.  - If you need your prescription sent electronically to a different pharmacy, notify our office through [facility] or by phone at [phone] option 4.

## 2021-02-01 ENCOUNTER — Encounter: Payer: Self-pay | Admitting: Dermatology

## 2021-02-14 ENCOUNTER — Encounter: Payer: Self-pay | Admitting: Dermatology

## 2021-02-22 ENCOUNTER — Telehealth: Payer: Self-pay

## 2021-02-22 NOTE — Telephone Encounter (Signed)
Please schedule this patient for an appointment to be evaluated at the next available , I do have appointments available tomorrow.

## 2021-02-22 NOTE — Telephone Encounter (Signed)
pt left a message that the medication for her headaches is not working for her she states she feels down and irritable

## 2021-02-23 ENCOUNTER — Ambulatory Visit (INDEPENDENT_AMBULATORY_CARE_PROVIDER_SITE_OTHER): Payer: BC Managed Care – PPO | Admitting: Psychiatry

## 2021-02-23 ENCOUNTER — Encounter: Payer: Self-pay | Admitting: Psychiatry

## 2021-02-23 ENCOUNTER — Other Ambulatory Visit: Payer: Self-pay

## 2021-02-23 VITALS — BP 129/83 | HR 97 | Temp 98.3°F | Ht 70.0 in | Wt 155.0 lb

## 2021-02-23 DIAGNOSIS — F411 Generalized anxiety disorder: Secondary | ICD-10-CM

## 2021-02-23 DIAGNOSIS — F330 Major depressive disorder, recurrent, mild: Secondary | ICD-10-CM | POA: Diagnosis not present

## 2021-02-23 DIAGNOSIS — F5081 Binge eating disorder: Secondary | ICD-10-CM | POA: Diagnosis not present

## 2021-02-23 DIAGNOSIS — F50819 Binge eating disorder, unspecified: Secondary | ICD-10-CM

## 2021-02-23 MED ORDER — BUSPIRONE HCL 30 MG PO TABS
30.0000 mg | ORAL_TABLET | Freq: Two times a day (BID) | ORAL | 0 refills | Status: DC
Start: 2021-02-23 — End: 2021-05-20

## 2021-02-23 NOTE — Progress Notes (Signed)
[facility] MD OP Progress Note  02/23/2021 4:25 PM Barbara Russo  MRN:  [id number]  Chief Complaint:  Chief Complaint   Follow-up; Anxiety; Depression; Headache    HPI: Barbara Russo is a 60 year old Caucasian female single, employed, lives in [place], has a history of MDD, binge eating disorder, headaches, hyperlipidemia was evaluated in office today.  Patient today reports she has been feeling sad, irritable the past few days.  Patient became tearful in session when she discussed this.  She reports she initially thought it could be the Emgality that she has been taking since the past few months.  It has been progressively getting worse since being on the Emgality.  Her headaches are much better now.  It does not really affect her day-to-day functioning anymore.  Patient reports sleep is overall okay.  She reports she has been worrying a lot.  She often worries about everything that can go wrong, comes up with the worst case scenario.  She reports she is often restless, anxious, nervous.  This has been getting worse since the past several months.  Patient reports recently she got up in the middle of the night and wondered what her life was all about.  She however denies suicidal thoughts.  Patient reports appetite is fair.  She denies any hallucinations.  Patient reports work is going well and she lost her work.  She is compliant on her medications.  She denies side effects.  Patient denies any binging on food.  She reports recent psychosocial stressors of deaths of her close friend's parents.  She reports that made her sad because her depression was initially triggered by death of her own parents.  Patient denies any other concerns today.  Visit Diagnosis:    ICD-10-CM   1. MDD (major depressive disorder), recurrent episode, mild (HCC)  F33.0 busPIRone (BUSPAR) 30 MG tablet    2. Binge eating disorder  F50.81     3. GAD (generalized anxiety disorder)  F41.1 busPIRone  (BUSPAR) 30 MG tablet      Past Psychiatric History: Reviewed past psychiatric history from progress note on 01/30/2018.  Past trials of Valium, Wellbutrin, BuSpar, Ritalin, Depakote, Vyvanse  Past Medical History:  Past Medical History:  Diagnosis Date   Actinic keratosis    Anxiety    Depression    Dysplastic nevus 10/03/2019   L lower back paraspinal mod to severe (shave removal)   Dysplastic nevus 09/23/2020   R lat breast - moderate   Squamous cell carcinoma of skin 09/26/2019   SCCIS - L prox lat pretibial     Past Surgical History:  Procedure Laterality Date   BREAST BIOPSY Left 2008   neg    Family Psychiatric History: Reviewed family psychiatric history from progress note on 01/30/2018  Family History:  Family History  Adopted: Yes    Social History: Reviewed social history from progress note on 01/30/2018 Social History   Socioeconomic History   Marital status: Single    Spouse name: Not on file   Number of children: 0   Years of education: Not on file   Highest education level: Bachelor's degree (e.g., BA, AB, BS)  Occupational History   Not on file  Tobacco Use   Smoking status: Never   Smokeless tobacco: Never  Vaping Use   Vaping Use: Never used  Substance and Sexual Activity   Alcohol use: Yes    Alcohol/week: 2.0 standard drinks    Types: 2 Cans of beer per week   Drug  use: Never   Sexual activity: Not Currently  Other Topics Concern   Not on file  Social History Narrative   Not on file   Social Determinants of Health   Financial Resource Strain: Not on file  Food Insecurity: Not on file  Transportation Needs: Not on file  Physical Activity: Not on file  Stress: Not on file  Social Connections: Not on file    Allergies: No Known Allergies  Metabolic Disorder Labs: No results found for: HGBA1C, MPG No results found for: PROLACTIN No results found for: CHOL, TRIG, HDL, CHOLHDL, VLDL, LDLCALC No results found for: TSH  Therapeutic  Level Labs: No results found for: LITHIUM No results found for: VALPROATE No components found for:  CBMZ  Current Medications: Current Outpatient Medications  Medication Sig Dispense Refill   Acetaminophen-Codeine 300-30 MG tablet Take 1 tablet by mouth every 4 (four) hours as needed.     ASCOMP-CODEINE 50-325-40-30 MG capsule Take 1 capsule by mouth every 4 (four) hours as needed.     buPROPion (WELLBUTRIN XL) 300 MG 24 hr tablet Take 1 tablet (300 mg total) by mouth daily. 90 tablet 1   busPIRone (BUSPAR) 30 MG tablet Take 1 tablet (30 mg total) by mouth 2 (two) times daily. 180 tablet 0   butalbital-acetaminophen-caffeine (FIORICET) 50-325-40 MG tablet Take by mouth.     butalbital-aspirin-caffeine (FIORINAL) 50-325-40 MG capsule Take 1 capsule by mouth every 4 (four) hours as needed.     Calcium Carbonate-Vitamin D 600-200 MG-UNIT TABS Take 600 mg by mouth.     cetirizine (ZYRTEC) 10 MG tablet Take by mouth.     divalproex (DEPAKOTE) 250 MG DR tablet Take 250 mg by mouth 3 (three) times daily.     gabapentin (NEURONTIN) 400 MG capsule Take 400 mg by mouth 3 (three) times daily.     Galcanezumab-gnlm 120 MG/ML SOAJ Inject into the skin.     simvastatin (ZOCOR) 20 MG tablet Take 20 mg by mouth daily.     traMADol (ULTRAM) 50 MG tablet Take by mouth.     topiramate (TOPAMAX) 100 MG tablet Take 100 mg by mouth 2 (two) times daily.     No current facility-administered medications for this visit.     Musculoskeletal: Strength & Muscle Tone: within normal limits Gait & Station: normal Patient leans: N/A  Psychiatric Specialty Exam: Review of Systems  Neurological:  Positive for headaches.  Psychiatric/Behavioral:  Positive for dysphoric mood. The patient is nervous/anxious.   All other systems reviewed and are negative.  Blood pressure 129/83, pulse 97, temperature 98.3 F (36.8 C), temperature source Temporal, height '5\' 10"'$  (1.778 m), weight 155 lb (70.3 kg).Body mass index is  22.24 kg/m.  General Appearance: Casual  Eye Contact:  Good  Speech:  Clear and Coherent  Volume:  Normal  Mood:  Anxious and Depressed  Affect:  Tearful  Thought Process:  Goal Directed and Descriptions of Associations: Intact  Orientation:  Full (Time, Place, and Person)  Thought Content: Logical   Suicidal Thoughts:  No  Homicidal Thoughts:  No  Memory:  Immediate;   Fair Recent;   Fair Remote;   Fair  Judgement:  Fair  Insight:  Fair  Psychomotor Activity:  Normal  Concentration:  Concentration: Fair and Attention Span: Fair  Recall:  [employer] of Knowledge: Fair  Language: Fair  Akathisia:  No  Handed:  Right  AIMS (if indicated): done  Assets:  Communication Skills Desire for [place]  Talents/Skills Transportation Vocational/Educational  ADL's:  Intact  Cognition: WNL  Sleep:  Fair   Screenings: GAD-7    Flowsheet [place] Office Visit from 02/23/2021 in [facility]  Total GAD-7 Score 7      PHQ2-9    [place] Office Visit from 02/23/2021 in [facility] Video Visit from 11/17/2020 in [facility] Video Visit from 08/21/2020 in [facility] Office Visit from 02/11/2019 in [facility]  PHQ-2 Total Score 3 1 0 1  PHQ-9 Total Score 10 -- -- --      [place] Office Visit from 02/23/2021 in [facility] Video Visit from 08/21/2020 in [facility] No Risk No Risk        Assessment and Plan: Barbara Russo is a 60 year old Caucasian female, single, lives in [place], has a history of depression, binge eating disorder, chronic headache was evaluated in office today.  Patient is currently struggling with depression, anxiety and will benefit from the following plan.  Plan MDD-unstable Discussed changing her  BuSpar to another antidepressant however she is not interested at this time. Increase BuSpar to 30 mg p.o. twice daily Wellbutrin XL 300 mg p.o. daily  GAD-unstable Referred for psychotherapy sessions with Ms. Christina Hussami Increase BuSpar to 30 mg p.o. twice daily   Binge eating disorder in remission We will monitor closely  Follow-up in clinic in 3 weeks in person.  This note was generated in part or whole with voice recognition software. Voice recognition is usually quite accurate but there are transcription errors that can and very often do occur. I apologize for any typographical errors that were not detected and corrected.       Ursula Alert, MD 02/23/2021, 4:25 PM

## 2021-03-16 ENCOUNTER — Other Ambulatory Visit: Payer: Self-pay

## 2021-03-16 ENCOUNTER — Ambulatory Visit (INDEPENDENT_AMBULATORY_CARE_PROVIDER_SITE_OTHER): Payer: BC Managed Care – PPO | Admitting: Licensed Clinical Social Worker

## 2021-03-16 DIAGNOSIS — F330 Major depressive disorder, recurrent, mild: Secondary | ICD-10-CM | POA: Diagnosis not present

## 2021-03-16 NOTE — Progress Notes (Signed)
Virtual Visit via Video Note  I connected with Barbara Russo on 03/16/21 at  4:00 PM EDT by a video enabled telemedicine application and verified that I am speaking with the correct person using two identifiers.  Location: Patient: home Provider: remote office [place], [place])   I discussed the limitations of evaluation and management by telemedicine and the availability of in person appointments. The patient expressed understanding and agreed to proceed.  I discussed the assessment and treatment plan with the patient. The patient was provided an opportunity to ask questions and all were answered. The patient agreed with the plan and demonstrated an understanding of the instructions.   The patient was advised to call back or seek an in-person evaluation if the symptoms worsen or if the condition fails to improve as anticipated.  I provided 60 minutes of non-face-to-face time during this encounter.   Yosef Krogh R Spyros Winch, LCSW   THERAPIST PROGRESS NOTE  Session Time: 4-5p  Participation Level: Active  Behavioral Response: Neat and Well GroomedAlertDepressed  Type of Therapy: Individual Therapy  Treatment Goals addressed:  Problem: Decrease depressive symptoms and improve levels of effective functioning  Goal: LTG: Reduce frequency, intensity, and duration of depression symptoms as evidenced by: SSB input needed on appropriate metric Outcome: Not Applicable  Goal: STG: Barbara Russo WILL PARTICIPATE IN AT LEAST 80% OF SCHEDULED INDIVIDUAL PSYCHOTHERAPY SESSIONS Outcome: Not Applicable  Interventions:  Intervention: Assist with coping skills and behavior  Intervention: Encourage patient to identify triggers  Intervention: Encourage new environment or opportunities for social interaction  Intervention: Provide grief and bereavement support  Summary: Barbara Russo is a 60 y.o. female who presents with symptoms consistent w/ MDD. Pt reports that overall mood has been  stable. Pt reports good quality and quantity of sleep.   Allowed pt to explore and express thoughts and feelings associated with recent life situations and external stressors. Pt reports that she recently moved from [place] to [place] 3 years ago. Pt reports that she also separated from a partner she had for 16 years. Pt has had counseling in the past and did not find it very helpful--counselor was more psychodynamic in practice and pt did not connect with that. Discussed loss of parents, relationships with siblings, transition in work environments, pleasurable activities, childhood environment, and current health related concerns.   Reviewed pts current coping skills for managing depression and anxiety/stress. Encouraged pt to continue with current coping skills and discussed other CBT based techniques. Pt receptive and reflects understanding.   Continued recommendations are as follows: self care behaviors, positive social engagements, focusing on overall work/home/life balance, and focusing on positive physical and emotional wellness.   Suicidal/Homicidal: No  Therapist Response: Reviewed CCA of record and developed tx plan/goals  Plan: Return again in 4 weeks.  Diagnosis: Axis I: MDD, recurrent    Axis II: No diagnosis    [place], LCSW 03/16/2021

## 2021-03-17 NOTE — Plan of Care (Signed)
  Problem: Decrease depressive symptoms and improve levels of effective functioning Goal: LTG: Reduce frequency, intensity, and duration of depression symptoms as evidenced by: SSB input needed on appropriate metric Outcome: Not Applicable Goal: STG: Barbara Russo WILL PARTICIPATE IN AT LEAST 80% OF SCHEDULED INDIVIDUAL PSYCHOTHERAPY SESSIONS Outcome: Not Applicable Intervention: Assist with coping skills and behavior Intervention: Encourage patient to identify triggers Intervention: Encourage new environment or opportunities for social interaction Intervention: Provide grief and bereavement support

## 2021-03-18 ENCOUNTER — Encounter: Payer: Self-pay | Admitting: Psychiatry

## 2021-03-18 ENCOUNTER — Ambulatory Visit: Payer: BC Managed Care – PPO | Admitting: Psychiatry

## 2021-03-18 ENCOUNTER — Other Ambulatory Visit: Payer: Self-pay

## 2021-03-18 VITALS — BP 130/85 | HR 87 | Temp 98.1°F | Wt 155.6 lb

## 2021-03-18 DIAGNOSIS — F5081 Binge eating disorder: Secondary | ICD-10-CM | POA: Diagnosis not present

## 2021-03-18 DIAGNOSIS — F411 Generalized anxiety disorder: Secondary | ICD-10-CM | POA: Diagnosis not present

## 2021-03-18 DIAGNOSIS — F3342 Major depressive disorder, recurrent, in full remission: Secondary | ICD-10-CM | POA: Diagnosis not present

## 2021-03-18 DIAGNOSIS — F330 Major depressive disorder, recurrent, mild: Secondary | ICD-10-CM

## 2021-03-18 NOTE — Progress Notes (Signed)
[facility] MD OP Progress Note  03/18/2021 9:52 AM Barbara Russo  MRN:  [id number]  Chief Complaint:  Chief Complaint   Follow-up; Anxiety; Depression    HPI: Barbara Russo is a 60 year old Caucasian female, single, employed, lives in [place], has a history of MDD, binge eating disorder, headaches, hyperlipidemia was evaluated in office today.  Patient today reports she has noticed improvement in her sadness, irritability, since being on the higher dosage of BuSpar.  She has not noticed any side effects.  She reports she had psychotherapy sessions with Ms. Christina Hussami and that also was beneficial.  Patient continues to tolerate Emgality, for her headaches well.  Patient denies any suicidality, homicidality or perceptual disturbances.  Patient reports appetite is fair.  She reports work is going well.  Patient denies any other concerns today.  Visit Diagnosis:    ICD-10-CM   1. MDD (major depressive disorder), recurrent, in full remission ([name])  F33.42     2. Binge eating disorder  F50.81     3. GAD (generalized anxiety disorder)  F41.1       Past Psychiatric History: Reviewed past psychiatric history from progress note on 01/30/2018.  Past trials of Valium, Wellbutrin, BuSpar, Ritalin, Depakote, Vyvanse  Past Medical History:  Past Medical History:  Diagnosis Date   Actinic keratosis    Anxiety    Depression    Dysplastic nevus 10/03/2019   L lower back paraspinal mod to severe (shave removal)   Dysplastic nevus 09/23/2020   R lat breast - moderate   Squamous cell carcinoma of skin 09/26/2019   SCCIS - L prox lat pretibial     Past Surgical History:  Procedure Laterality Date   BREAST BIOPSY Left 2008   neg    Family Psychiatric History: Reviewed family psychiatric history from progress note on 01/30/2018  Family History:  Family History  Adopted: Yes    Social History: Reviewed social history from progress note on 01/30/2018 Social History    Socioeconomic History   Marital status: Single    Spouse name: Not on file   Number of children: 0   Years of education: Not on file   Highest education level: Bachelor's degree (e.g., BA, AB, BS)  Occupational History   Not on file  Tobacco Use   Smoking status: Never   Smokeless tobacco: Never  Vaping Use   Vaping Use: Never used  Substance and Sexual Activity   Alcohol use: Yes    Alcohol/week: 2.0 standard drinks    Types: 2 Cans of beer per week   Drug use: Never   Sexual activity: Not Currently  Other Topics Concern   Not on file  Social History Narrative   Not on file   Social Determinants of Health   Financial Resource Strain: Not on file  Food Insecurity: Not on file  Transportation Needs: Not on file  Physical Activity: Not on file  Stress: Not on file  Social Connections: Not on file    Allergies: No Known Allergies  Metabolic Disorder Labs: No results found for: HGBA1C, MPG No results found for: PROLACTIN No results found for: CHOL, TRIG, HDL, CHOLHDL, VLDL, LDLCALC No results found for: TSH  Therapeutic Level Labs: No results found for: LITHIUM No results found for: VALPROATE No components found for:  CBMZ  Current Medications: Current Outpatient Medications  Medication Sig Dispense Refill   buPROPion (WELLBUTRIN XL) 300 MG 24 hr tablet Take 1 tablet (300 mg total) by mouth daily. 90 tablet 1  busPIRone (BUSPAR) 30 MG tablet Take 1 tablet (30 mg total) by mouth 2 (two) times daily. 180 tablet 0   butalbital-acetaminophen-caffeine (FIORICET) 50-325-40 MG tablet Take by mouth.     Calcium Carbonate-Vitamin D 600-200 MG-UNIT TABS Take 600 mg by mouth.     cetirizine (ZYRTEC) 10 MG tablet Take by mouth.     divalproex (DEPAKOTE) 250 MG DR tablet Take 250 mg by mouth 3 (three) times daily.     gabapentin (NEURONTIN) 400 MG capsule Take 400 mg by mouth 3 (three) times daily.     Galcanezumab-gnlm 120 MG/ML SOAJ Inject into the skin.      simvastatin (ZOCOR) 20 MG tablet Take 20 mg by mouth daily.     traMADol (ULTRAM) 50 MG tablet Take by mouth.     Acetaminophen-Codeine 300-30 MG tablet Take 1 tablet by mouth every 4 (four) hours as needed. (Patient not taking: Reported on 03/18/2021)     topiramate (TOPAMAX) 100 MG tablet Take 100 mg by mouth 2 (two) times daily.     No current facility-administered medications for this visit.     Musculoskeletal: Strength & Muscle Tone:  WNL Gait & Station: normal Patient leans: N/A  Psychiatric Specialty Exam: Review of Systems  Psychiatric/Behavioral:  The patient is nervous/anxious.   All other systems reviewed and are negative.  Blood pressure 130/85, pulse 87, temperature 98.1 F (36.7 C), temperature source Temporal, weight 155 lb 9.6 oz (70.6 kg).Body mass index is 22.33 kg/m.  General Appearance: Casual  Eye Contact:  Fair  Speech:  Clear and Coherent  Volume:  Normal  Mood:  Anxious improving  Affect:  Congruent  Thought Process:  Goal Directed and Descriptions of Associations: Intact  Orientation:  Full (Time, Place, and Person)  Thought Content: Logical   Suicidal Thoughts:  No  Homicidal Thoughts:  No  Memory:  Immediate;   Fair Recent;   Fair Remote;   Fair  Judgement:  Fair  Insight:  Fair  Psychomotor Activity:  Normal  Concentration:  Concentration: Fair and Attention Span: Fair  Recall:  [employer] of Knowledge: Fair  Language: Fair  Akathisia:  No  Handed:  Right  AIMS (if indicated): done  Assets:  Communication Skills Desire for Improvement Housing Social Support  ADL's:  Intact  Cognition: WNL  Sleep:  Fair   Screenings: GAD-7    [place] Office Visit from 02/23/2021 in [facility]  Total GAD-7 Score 7      PHQ2-9    [place] Visit from 03/18/2021 in [facility] Visit from 02/23/2021 in [facility] Video Visit from 11/17/2020  in [facility] Video Visit from 08/21/2020 in [facility] Visit from 02/11/2019 in [facility]  PHQ-2 Total Score 0 3 1 0 1  PHQ-9 Total Score 2 10 -- -- --      Flowsheet Row Counselor from 03/16/2021 in [facility] Visit from 02/23/2021 in [facility] Video Visit from 08/21/2020 in [facility] No Risk No Risk No Risk        Assessment and Plan: Barbara Russo is a 60 year old Caucasian female, single, lives in [place], has a history of depression, binge eating disorder, chronic headache was evaluated in office today.  Patient is currently improving.  Plan as noted below.  Plan MDD in remission BuSpar 30 mg p.o. twice daily Wellbutrin  XL 300 mg p.o. daily  GAD-stable BuSpar 30 mg p.o. twice daily Continue CBT with Christina Hussami  Binge eating disorder in remission We will monitor closely  Follow-up in clinic in person in 4 weeks or sooner if needed.  This note was generated in part or whole with voice recognition software. Voice recognition is usually quite accurate but there are transcription errors that can and very often do occur. I apologize for any typographical errors that were not detected and corrected.       Ursula Alert, MD 03/19/2021, 8:18 AM

## 2021-04-08 ENCOUNTER — Encounter: Payer: Self-pay | Admitting: Psychiatry

## 2021-04-08 ENCOUNTER — Telehealth (INDEPENDENT_AMBULATORY_CARE_PROVIDER_SITE_OTHER): Payer: BC Managed Care – PPO | Admitting: Psychiatry

## 2021-04-08 ENCOUNTER — Other Ambulatory Visit: Payer: Self-pay

## 2021-04-08 DIAGNOSIS — F5081 Binge eating disorder: Secondary | ICD-10-CM

## 2021-04-08 DIAGNOSIS — F3342 Major depressive disorder, recurrent, in full remission: Secondary | ICD-10-CM | POA: Diagnosis not present

## 2021-04-08 DIAGNOSIS — F411 Generalized anxiety disorder: Secondary | ICD-10-CM | POA: Diagnosis not present

## 2021-04-08 NOTE — Progress Notes (Signed)
Virtual Visit via Video Note  I connected with Barbara Russo on 04/08/21 at 10:20 AM EDT by a video enabled telemedicine application and verified that I am speaking with the correct person using two identifiers.  Location Provider Location : [facility] Patient Location : Home  Participants: Patient , Provider    I discussed the limitations of evaluation and management by telemedicine and the availability of in person appointments. The patient expressed understanding and agreed to proceed.    I discussed the assessment and treatment plan with the patient. The patient was provided an opportunity to ask questions and all were answered. The patient agreed with the plan and demonstrated an understanding of the instructions.   The patient was advised to call back or seek an in-person evaluation if the symptoms worsen or if the condition fails to improve as anticipated. [facility] MD OP Progress Note  04/08/2021 10:28 AM Barbara Russo  MRN:  [id number]  Chief Complaint:  Chief Complaint   Follow-up; Depression; Anxiety    HPI: Barbara Russo is a 60 year old Caucasian female, single, employed, lives in [place], has a history of MDD, binge eating disorder, headaches, hyperlipidemia was evaluated by telemedicine today.  Patient today reports overall her mood symptoms are currently stable.  She denies any significant anxiety at this time.  She is coping better with her day-to-day stressors.  She enjoys going to work.  Today is her day off and she is planning to relax.  She is compliant on medications.  Denies side effects.  She reports the Emgality has been very helpful and that has been keeping her headaches stable.  That also has a lot to do with her mood symptoms being better.  Continues to follow-up with her therapist Ms. Christina Hussami.  Patient denies any suicidality, homicidality or perceptual disturbances.  Patient denies any other concerns today.  Visit Diagnosis:     ICD-10-CM   1. MDD (major depressive disorder), recurrent, in full remission ([place])  F33.42     2. Binge eating disorder  F50.81     3. GAD (generalized anxiety disorder)  F41.1       Past Psychiatric History: Reviewed past psychiatric history from progress note on 01/30/2018.  Past trials of Valium, Wellbutrin, BuSpar, Ritalin, Depakote, Vyvanse  Past Medical History:  Past Medical History:  Diagnosis Date   Actinic keratosis    Anxiety    Depression    Dysplastic nevus 10/03/2019   L lower back paraspinal mod to severe (shave removal)   Dysplastic nevus 09/23/2020   R lat breast - moderate   Squamous cell carcinoma of skin 09/26/2019   SCCIS - L prox lat pretibial     Past Surgical History:  Procedure Laterality Date   BREAST BIOPSY Left 2008   neg    Family Psychiatric History: Reviewed family psychiatric history from progress note on 01/30/2018  Family History:  Family History  Adopted: Yes    Social History: Reviewed social history from progress note on 01/30/2018 Social History   Socioeconomic History   Marital status: Single    Spouse name: Not on file   Number of children: 0   Years of education: Not on file   Highest education level: Bachelor's degree (e.g., BA, AB, BS)  Occupational History   Not on file  Tobacco Use   Smoking status: Never   Smokeless tobacco: Never  Vaping Use   Vaping Use: Never used  Substance and Sexual Activity   Alcohol use: Yes    Alcohol/week:  2.0 standard drinks    Types: 2 Cans of beer per week   Drug use: Never   Sexual activity: Not Currently  Other Topics Concern   Not on file  Social History Narrative   Not on file   Social Determinants of Health   Financial Resource Strain: Not on file  Food Insecurity: Not on file  Transportation Needs: Not on file  Physical Activity: Not on file  Stress: Not on file  Social Connections: Not on file    Allergies: No Known Allergies  Metabolic Disorder Labs: No results  found for: HGBA1C, MPG No results found for: PROLACTIN No results found for: CHOL, TRIG, HDL, CHOLHDL, VLDL, LDLCALC No results found for: TSH  Therapeutic Level Labs: No results found for: LITHIUM No results found for: VALPROATE No components found for:  CBMZ  Current Medications: Current Outpatient Medications  Medication Sig Dispense Refill   Acetaminophen-Codeine 300-30 MG tablet Take 1 tablet by mouth every 4 (four) hours as needed. (Patient not taking: Reported on 03/18/2021)     buPROPion (WELLBUTRIN XL) 300 MG 24 hr tablet Take 1 tablet (300 mg total) by mouth daily. 90 tablet 1   busPIRone (BUSPAR) 30 MG tablet Take 1 tablet (30 mg total) by mouth 2 (two) times daily. 180 tablet 0   butalbital-acetaminophen-caffeine (FIORICET) 50-325-40 MG tablet Take by mouth.     Calcium Carbonate-Vitamin D 600-200 MG-UNIT TABS Take 600 mg by mouth.     cetirizine (ZYRTEC) 10 MG tablet Take by mouth.     divalproex (DEPAKOTE) 250 MG DR tablet Take 250 mg by mouth 3 (three) times daily.     gabapentin (NEURONTIN) 400 MG capsule Take 400 mg by mouth 3 (three) times daily.     Galcanezumab-gnlm 120 MG/ML SOAJ Inject into the skin.     simvastatin (ZOCOR) 20 MG tablet Take 20 mg by mouth daily.     topiramate (TOPAMAX) 100 MG tablet Take 100 mg by mouth 2 (two) times daily.     traMADol (ULTRAM) 50 MG tablet Take by mouth.     No current facility-administered medications for this visit.     Musculoskeletal: Strength & Muscle Tone:  UTA Gait & Station: normal Patient leans: N/A  Psychiatric Specialty Exam: Review of Systems  Psychiatric/Behavioral:  Negative for agitation, behavioral problems, confusion, decreased concentration, dysphoric mood, hallucinations, self-injury, sleep disturbance and suicidal ideas. The patient is not nervous/anxious and is not hyperactive.   All other systems reviewed and are negative.  There were no vitals taken for this visit.There is no height or weight on  file to calculate BMI.  General Appearance: Casual  Eye Contact:  Fair  Speech:  Normal Rate  Volume:  Normal  Mood:  Euthymic  Affect:  Congruent  Thought Process:  Goal Directed and Descriptions of Associations: Intact  Orientation:  Full (Time, Place, and Person)  Thought Content: Logical   Suicidal Thoughts:  No  Homicidal Thoughts:  No  Memory:  Immediate;   Fair Recent;   Fair Remote;   Fair  Judgement:  Fair  Insight:  Fair  Psychomotor Activity:  Normal  Concentration:  Concentration: Fair and Attention Span: Fair  Recall:  [employer] of Knowledge: Fair  Language: Fair  Akathisia:  No  Handed:  Right  AIMS (if indicated): done  Assets:  Communication Skills Desire for Improvement Housing Social Support Talents/Skills Transportation Vocational/Educational  ADL's:  Intact  Cognition: WNL  Sleep:  Fair   Screenings: GAD-7  Flowsheet Row Video Visit from 04/08/2021 in [facility] Office Visit from 02/23/2021 in [facility]  Total GAD-7 Score 2 7      PHQ2-9    Flowsheet Row Video Visit from 04/08/2021 in [facility] Office Visit from 03/18/2021 in [facility] Office Visit from 02/23/2021 in [facility] Video Visit from 11/17/2020 in [facility] Video Visit from 08/21/2020 in [facility]  PHQ-2 Total Score 0 0 3 1 0  PHQ-9 Total Score -- 2 10 -- --      Flowsheet Row Counselor from 03/16/2021 in [facility] Office Visit from 02/23/2021 in [facility] Video Visit from 08/21/2020 in [facility] No Risk No Risk No Risk        Assessment and Plan: Barbara Russo is a 60 year old Caucasian female, single, lives in [place], has a history of depression,  binge eating disorder, chronic headaches was evaluated by telemedicine today.  Patient is currently stable.  Plan as noted below.  Plan MDD in remission BuSpar 30 mg p.o. daily Wellbutrin XL 300 mg p.o. daily  GAD-stable BuSpar 30 mg p.o. twice daily Continue CBT with Ms. Christina Hussami  Binge eating disorder in remission We will monitor closely  Follow-up in clinic in 6 to 8 weeks or sooner if needed.  This note was generated in part or whole with voice recognition software. Voice recognition is usually quite accurate but there are transcription errors that can and very often do occur. I apologize for any typographical errors that were not detected and corrected.       Ursula Alert, MD 04/08/2021, 2:51 PM

## 2021-05-20 ENCOUNTER — Other Ambulatory Visit: Payer: Self-pay

## 2021-05-20 ENCOUNTER — Telehealth: Payer: BC Managed Care – PPO | Admitting: Psychiatry

## 2021-05-20 ENCOUNTER — Encounter: Payer: Self-pay | Admitting: Psychiatry

## 2021-05-20 DIAGNOSIS — F3342 Major depressive disorder, recurrent, in full remission: Secondary | ICD-10-CM | POA: Diagnosis not present

## 2021-05-20 DIAGNOSIS — F5081 Binge eating disorder: Secondary | ICD-10-CM | POA: Diagnosis not present

## 2021-05-20 DIAGNOSIS — F411 Generalized anxiety disorder: Secondary | ICD-10-CM | POA: Diagnosis not present

## 2021-05-20 MED ORDER — BUSPIRONE HCL 30 MG PO TABS: 30.0000 mg | ORAL_TABLET | Freq: Two times a day (BID) | ORAL | 0 refills | Status: DC

## 2021-05-20 NOTE — Progress Notes (Signed)
Virtual Visit via Video Note  I connected with Barbara Russo on 05/20/21 at  9:00 AM EST by a video enabled telemedicine application and verified that I am speaking with the correct person using two identifiers.  Location Provider Location : [facility] Patient Location : Home  Participants: Patient , Provider    I discussed the limitations of evaluation and management by telemedicine and the availability of in person appointments. The patient expressed understanding and agreed to proceed.  I discussed the assessment and treatment plan with the patient. The patient was provided an opportunity to ask questions and all were answered. The patient agreed with the plan and demonstrated an understanding of the instructions.   The patient was advised to call back or seek an in-person evaluation if the symptoms worsen or if the condition fails to improve as anticipated.    [facility] MD OP Progress Note  05/20/2021 2:15 PM Barbara Russo  MRN:  [id number]  Chief Complaint:  Chief Complaint   Follow-up; Depression; Anxiety    HPI: Barbara Russo is a 60 year old Caucasian female, single, employed, lives in [place], has a history of MDD, binge eating disorder, headaches, hyperlipidemia was evaluated by telemedicine today.  Patient today reports she is currently stable on the current medication regimen.  Denies any significant anxiety or depression.  Reports appetite is fair.  Denies any binge eating episodes.  Patient continues to have headaches which are chronic.  She is on Emgality and reports that has been helpful.  She does have upcoming appointment with her neurologist.  Patient reports her provider left and she is going to see a new provider.  Patient denies any suicidality, homicidality or perceptual disturbances.  Patient is compliant on the BuSpar and Wellbutrin, denies side effects.  Reports she would like to get off of the BuSpar gradually however would like to be on it for now  since she wants to get through the holiday season.    Visit Diagnosis:    ICD-10-CM   1. MDD (major depressive disorder), recurrent, in full remission ([facility])  F33.42     2. Binge eating disorder  F50.81     3. GAD (generalized anxiety disorder)  F41.1 busPIRone (BUSPAR) 30 MG tablet      Past Psychiatric History: Reviewed past psychiatric history from progress note on 01/30/2018.  Past trials of Valium, Wellbutrin, BuSpar, Ritalin, Depakote, Vyvanse  Past Medical History:  Past Medical History:  Diagnosis Date   Actinic keratosis    Anxiety    Depression    Dysplastic nevus 10/03/2019   L lower back paraspinal mod to severe (shave removal)   Dysplastic nevus 09/23/2020   R lat breast - moderate   Squamous cell carcinoma of skin 09/26/2019   SCCIS - L prox lat pretibial     Past Surgical History:  Procedure Laterality Date   BREAST BIOPSY Left 2008   neg    Family Psychiatric History: Reviewed family psychiatric history from progress note on 01/30/2089  Family History:  Family History  Adopted: Yes    Social History: Reviewed social history from progress note on 01/30/2018 Social History   Socioeconomic History   Marital status: Single    Spouse name: Not on file   Number of children: 0   Years of education: Not on file   Highest education level: Bachelor's degree (e.g., BA, AB, BS)  Occupational History   Not on file  Tobacco Use   Smoking status: Never   Smokeless tobacco: Never  Vaping Use  Vaping Use: Never used  Substance and Sexual Activity   Alcohol use: Yes    Alcohol/week: 2.0 standard drinks    Types: 2 Cans of beer per week   Drug use: Never   Sexual activity: Not Currently  Other Topics Concern   Not on file  Social History Narrative   Not on file   Social Determinants of Health   Financial Resource Strain: Not on file  Food Insecurity: Not on file  Transportation Needs: Not on file  Physical Activity: Not on file  Stress: Not on file   Social Connections: Not on file    Allergies: No Known Allergies  Metabolic Disorder Labs: No results found for: HGBA1C, MPG No results found for: PROLACTIN No results found for: CHOL, TRIG, HDL, CHOLHDL, VLDL, LDLCALC No results found for: TSH  Therapeutic Level Labs: No results found for: LITHIUM No results found for: VALPROATE No components found for:  CBMZ  Current Medications: Current Outpatient Medications  Medication Sig Dispense Refill   Acetaminophen-Codeine 300-30 MG tablet Take 1 tablet by mouth every 4 (four) hours as needed. (Patient not taking: Reported on 03/18/2021)     buPROPion (WELLBUTRIN XL) 300 MG 24 hr tablet Take 1 tablet (300 mg total) by mouth daily. 90 tablet 1   busPIRone (BUSPAR) 30 MG tablet Take 1 tablet (30 mg total) by mouth 2 (two) times daily. 180 tablet 0   butalbital-acetaminophen-caffeine (FIORICET) 50-325-40 MG tablet Take by mouth.     Calcium Carbonate-Vitamin D 600-200 MG-UNIT TABS Take 600 mg by mouth.     cetirizine (ZYRTEC) 10 MG tablet Take by mouth.     divalproex (DEPAKOTE) 250 MG DR tablet Take 250 mg by mouth 3 (three) times daily.     gabapentin (NEURONTIN) 400 MG capsule Take 400 mg by mouth 3 (three) times daily.     Galcanezumab-gnlm 120 MG/ML SOAJ Inject into the skin.     simvastatin (ZOCOR) 20 MG tablet Take 20 mg by mouth daily.     topiramate (TOPAMAX) 100 MG tablet Take 100 mg by mouth 2 (two) times daily.     traMADol (ULTRAM) 50 MG tablet Take by mouth.     No current facility-administered medications for this visit.     Musculoskeletal: Strength & Muscle Tone:  UTA Gait & Station: normal Patient leans: N/A  Psychiatric Specialty Exam: Review of Systems  Neurological:  Positive for headaches.  Psychiatric/Behavioral:  Negative for agitation, behavioral problems, confusion, decreased concentration, dysphoric mood, hallucinations, self-injury, sleep disturbance and suicidal ideas. The patient is not  nervous/anxious and is not hyperactive.   All other systems reviewed and are negative.  There were no vitals taken for this visit.There is no height or weight on file to calculate BMI.  General Appearance: Casual  Eye Contact:  Fair  Speech:  Clear and Coherent  Volume:  Normal  Mood:  Euthymic  Affect:  Congruent  Thought Process:  Goal Directed and Descriptions of Associations: Intact  Orientation:  Full (Time, Place, and Person)  Thought Content: Logical   Suicidal Thoughts:  No  Homicidal Thoughts:  No  Memory:  Immediate;   Fair Recent;   Fair Remote;   Fair  Judgement:  Fair  Insight:  Fair  Psychomotor Activity:  Normal  Concentration:  Concentration: Fair and Attention Span: Fair  Recall:  [employer] of Knowledge: Fair  Language: Fair  Akathisia:  No  Handed:  Right  AIMS (if indicated): done, 0  Assets:  Communication  Skills Desire for Improvement Housing Social Support  ADL's:  Intact  Cognition: WNL  Sleep:  Fair   Screenings: GAD-7    Flowsheet Row Video Visit from 05/20/2021 in [facility] Video Visit from 04/08/2021 in [facility] Office Visit from 02/23/2021 in [facility]  Total GAD-7 Score 0 2 7      PHQ2-9    Flowsheet Row Video Visit from 05/20/2021 in [facility] Video Visit from 04/08/2021 in [facility] Office Visit from 03/18/2021 in [facility] Visit from 02/23/2021 in [facility] Video Visit from 11/17/2020 in [facility]  PHQ-2 Total Score 0 0 0 3 1  PHQ-9 Total Score -- -- 2 10 --      Flowsheet Row Counselor from 03/16/2021 in [facility] Office Visit from 02/23/2021 in [facility] Video Visit from 08/21/2020 in [facility] No Risk No Risk No Risk        Assessment and Plan: Jaimey Franchini is a 60 year old Caucasian female, single, lives in [place], has a history of depression, binge eating disorder, chronic headache was evaluated by telemedicine today.  Patient is currently stable.  Plan as noted below.  Plan MDD in remission BuSpar 30 mg p.o. twice daily. Wellbutrin XL 300 mg p.o. daily  GAD-stable BuSpar 30 mg p.o. twice daily Continue CBT with Ms. Christina Hussami  Binge eating disorder in remission We will monitor closely  Follow-up in clinic in 8 weeks or sooner if needed.   This note was generated in part or whole with voice recognition software. Voice recognition is usually quite accurate but there are transcription errors that can and very often do occur. I apologize for any typographical errors that were not detected and corrected.       Ursula Alert, MD 05/20/2021, 2:15 PM

## 2021-05-31 ENCOUNTER — Other Ambulatory Visit: Payer: Self-pay | Admitting: Physician Assistant

## 2021-05-31 DIAGNOSIS — Z1231 Encounter for screening mammogram for malignant neoplasm of breast: Secondary | ICD-10-CM

## 2021-06-12 IMAGING — MG MM DIGITAL SCREENING BILAT W/ TOMO AND CAD
8 series · 8 of 24 positions shown · non-contrast
Comparison: Previous exam(s).

CLINICAL DATA: Screening.

EXAM:
DIGITAL SCREENING BILATERAL MAMMOGRAM WITH TOMOSYNTHESIS AND CAD
TECHNIQUE: Bilateral screening digital craniocaudal and mediolateral oblique
mammograms were obtained. Bilateral screening digital breast
tomosynthesis was performed. The images were evaluated with
computer-aided detection.

[R CC synth-2D]
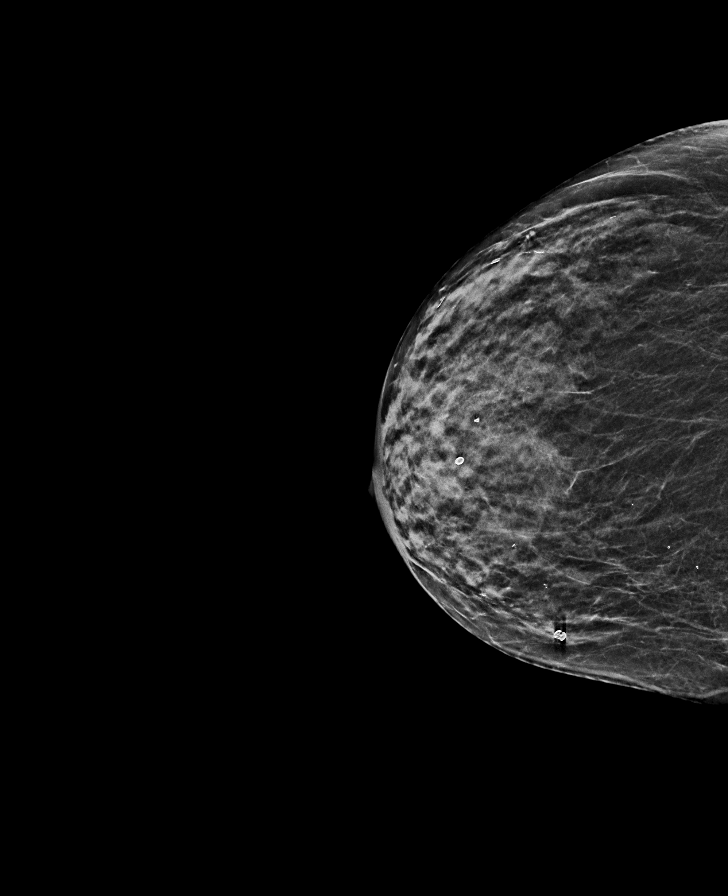

[R MLO synth-2D]
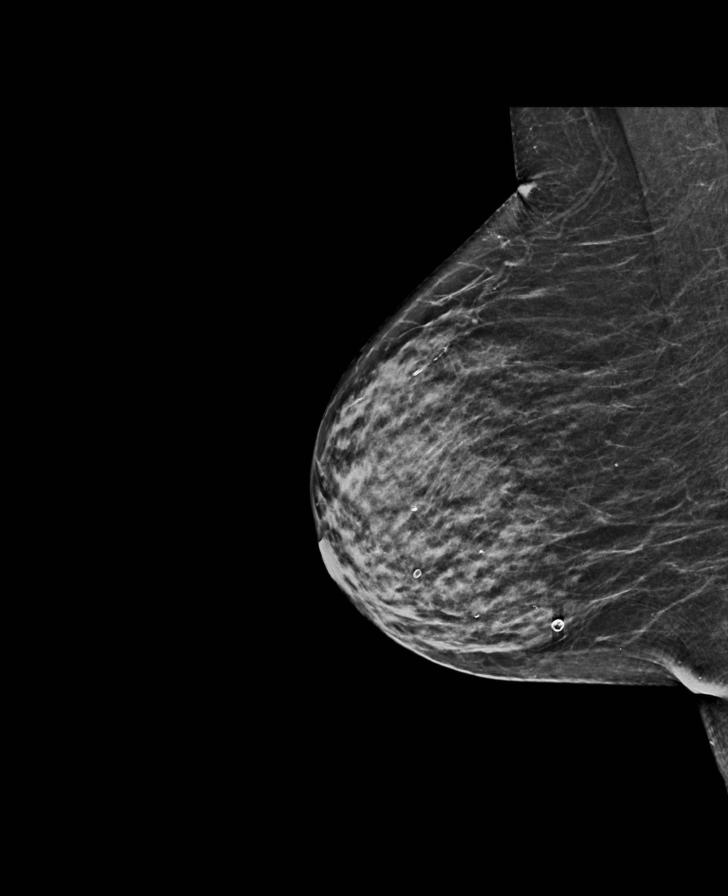

[L CC synth-2D]
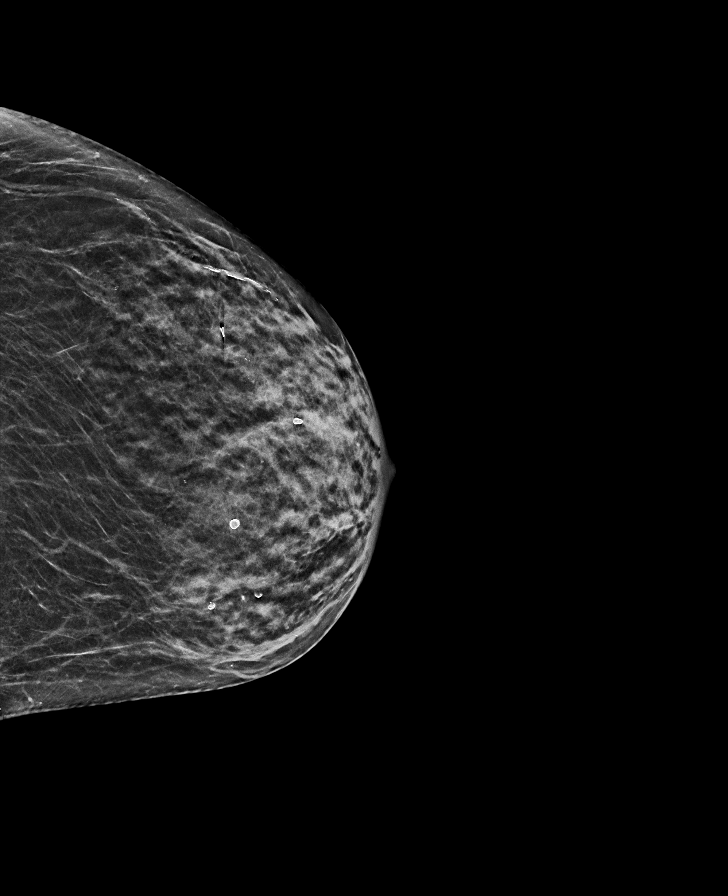

[L MLO synth-2D]
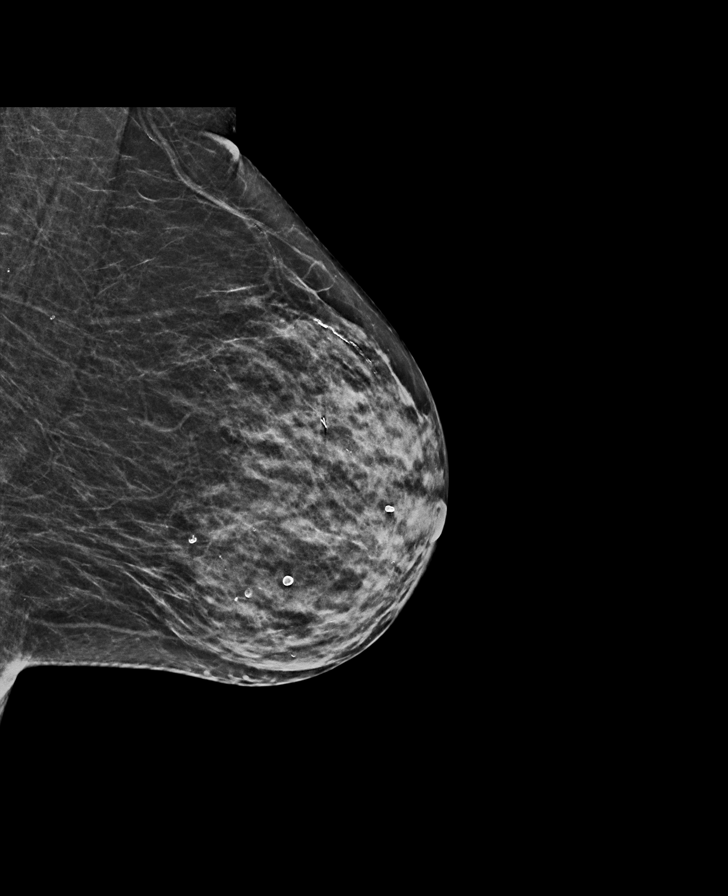

[L MLO tomo · tomo slice 25/50.0]
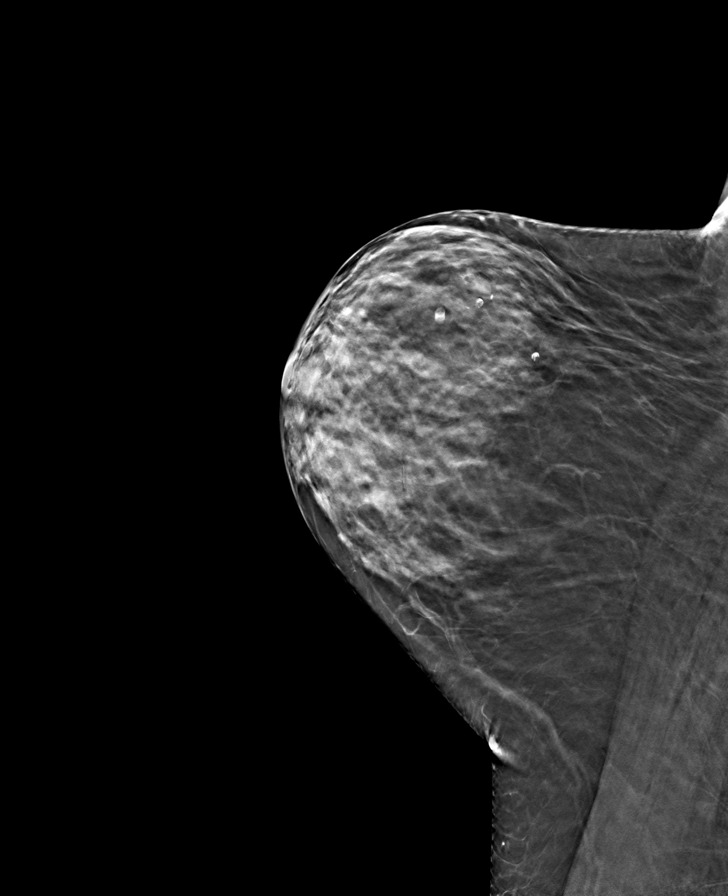

[R MLO tomo · tomo slice 25/49.0]
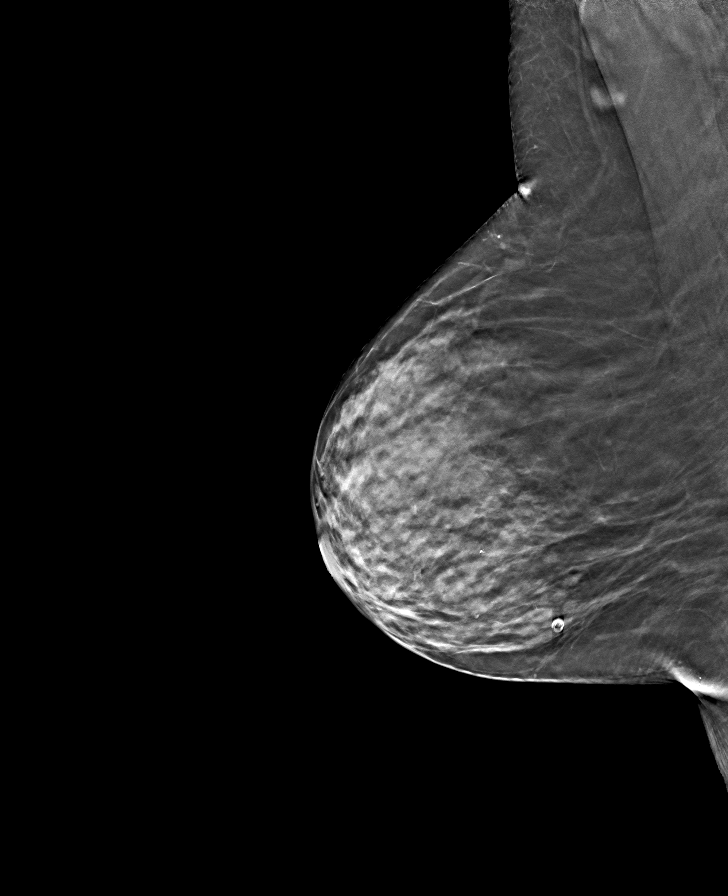

[R CC tomo · tomo slice 22/43.0]
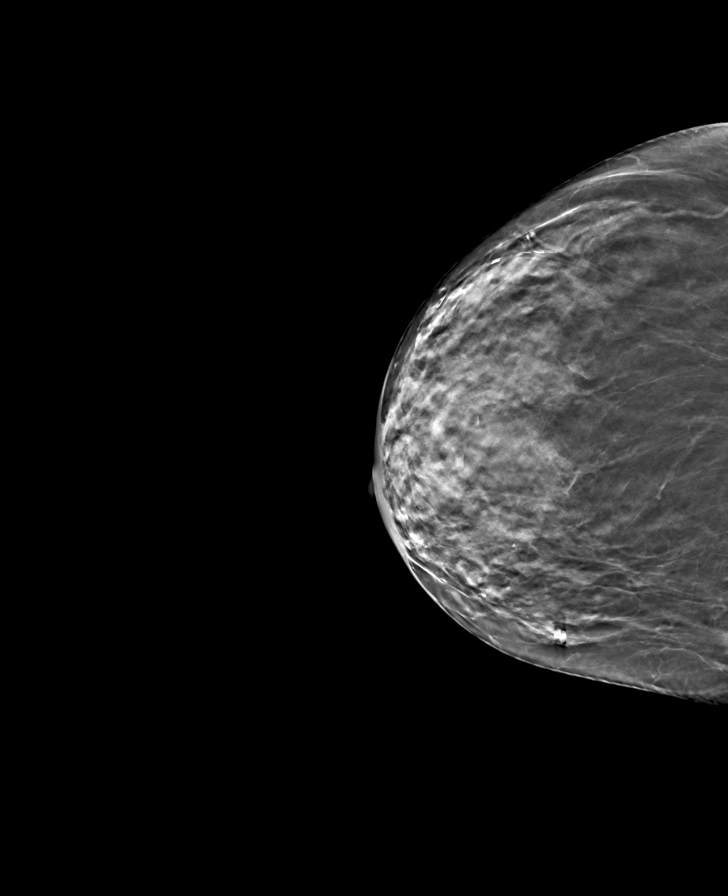

[L CC tomo · tomo slice 24/47.0]
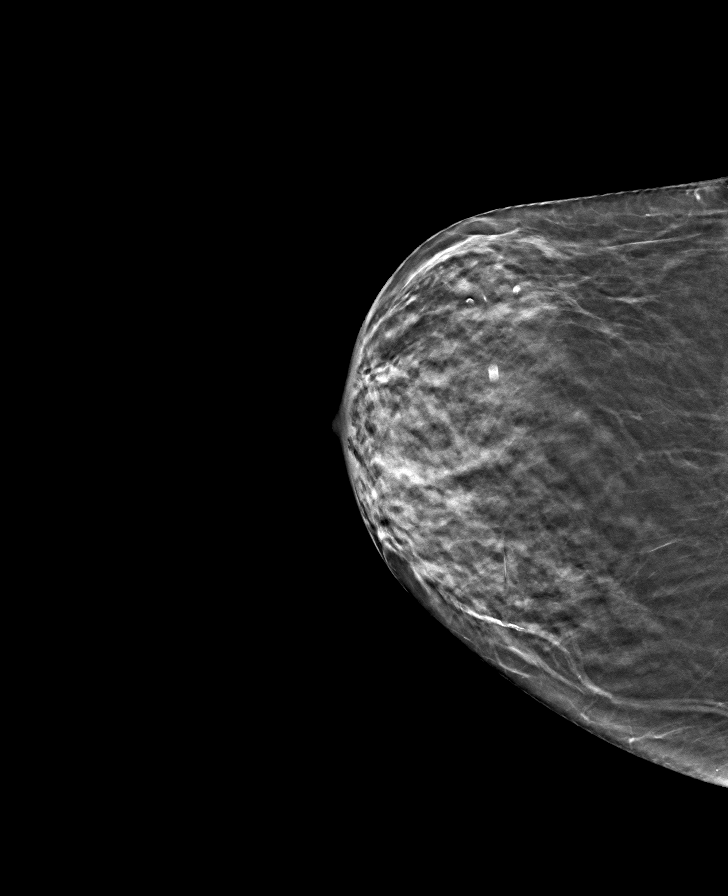

[8 of 24 positions shown; findings below may reference images not displayed]

ACR Breast Density Category c: The breast tissue is heterogeneously
dense, which may obscure small masses.
FINDINGS: There are no findings suspicious for malignancy.
IMPRESSION: No mammographic evidence of malignancy. A result letter of this
screening mammogram will be mailed directly to the patient.

RECOMMENDATION:
Screening mammogram in one year. (Code:Q3-W-BC3)

BI-RADS CATEGORY  1: Negative.

## 2021-07-09 ENCOUNTER — Telehealth (INDEPENDENT_AMBULATORY_CARE_PROVIDER_SITE_OTHER): Payer: BC Managed Care – PPO | Admitting: Psychiatry

## 2021-07-09 ENCOUNTER — Other Ambulatory Visit: Payer: Self-pay

## 2021-07-09 ENCOUNTER — Encounter: Payer: Self-pay | Admitting: Psychiatry

## 2021-07-09 DIAGNOSIS — F5081 Binge eating disorder: Secondary | ICD-10-CM | POA: Diagnosis not present

## 2021-07-09 DIAGNOSIS — F3342 Major depressive disorder, recurrent, in full remission: Secondary | ICD-10-CM | POA: Diagnosis not present

## 2021-07-09 DIAGNOSIS — F411 Generalized anxiety disorder: Secondary | ICD-10-CM | POA: Diagnosis not present

## 2021-07-09 MED ORDER — BUPROPION HCL ER (XL) 300 MG PO TB24: 300.0000 mg | ORAL_TABLET | Freq: Every day | ORAL | 1 refills | Status: DC

## 2021-07-09 MED ORDER — BUSPIRONE HCL 30 MG PO TABS: 30.0000 mg | ORAL_TABLET | Freq: Two times a day (BID) | ORAL | 0 refills | Status: DC

## 2021-07-09 NOTE — Progress Notes (Signed)
Virtual Visit via Video Note  I connected with Barbara Russo on 07/09/21 at  8:30 AM EST by a video enabled telemedicine application and verified that I am speaking with the correct person using two identifiers.  Location Provider Location : [facility] Patient Location : Home  Participants: Patient , Provider   I discussed the limitations of evaluation and management by telemedicine and the availability of in person appointments. The patient expressed understanding and agreed to proceed.    I discussed the assessment and treatment plan with the patient. The patient was provided an opportunity to ask questions and all were answered. The patient agreed with the plan and demonstrated an understanding of the instructions.   The patient was advised to call back or seek an in-person evaluation if the symptoms worsen or if the condition fails to improve as anticipated.    [facility] MD OP Progress Note  07/09/2021 10:26 AM Savilla Turbyfill  MRN:  [id number]  Chief Complaint:  Chief Complaint   Follow-up 61 year old Caucasian female, with history of major depression, binge eating disorder, generalized anxiety disorder was evaluated for medication management.    HPI: Barbara Russo is a 61 year old Caucasian female, single, employed, lives in [place], has a history of MDD, binge eating disorder, headaches, hyperlipidemia was evaluated by telemedicine today.  Patient is currently doing well with regards to her mood.  Reports her current medications as beneficial.  Denies side effects to her Wellbutrin or the BuSpar.  Reports appetite as good.  Denies any binging episodes.  Continues to have a headache however reports it is manageable.  Continues to follow-up with her neurologist.  Sleep could get restless at times due to headaches however overall she has been sleeping fairly well.  Reports she enjoys her work and stays busy.  Denies any suicidality, homicidality or perceptual  disturbances.  Patient denies any other concerns today.  Visit Diagnosis:    ICD-10-CM   1. MDD (major depressive disorder), recurrent, in full remission ([place])  F33.42 buPROPion (WELLBUTRIN XL) 300 MG 24 hr tablet    2. Binge eating disorder  F50.81     3. GAD (generalized anxiety disorder)  F41.1 busPIRone (BUSPAR) 30 MG tablet      Past Psychiatric History: Reviewed past psychiatric history from progress note on 01/30/2018.  Past trials of Valium, Wellbutrin, BuSpar, Ritalin, Depakote, Vyvanse.  Past Medical History:  Past Medical History:  Diagnosis Date   Actinic keratosis    Anxiety    Depression    Dysplastic nevus 10/03/2019   L lower back paraspinal mod to severe (shave removal)   Dysplastic nevus 09/23/2020   R lat breast - moderate   Squamous cell carcinoma of skin 09/26/2019   SCCIS - L prox lat pretibial     Past Surgical History:  Procedure Laterality Date   BREAST BIOPSY Left 2008   neg    Family Psychiatric History: Reviewed family psychiatric history from progress note on 01/30/2018.  Family History:  Family History  Adopted: Yes    Social History: Reviewed social history from progress note on 01/30/2018. Social History   Socioeconomic History   Marital status: Single    Spouse name: Not on file   Number of children: 0   Years of education: Not on file   Highest education level: Bachelor's degree (e.g., BA, AB, BS)  Occupational History   Not on file  Tobacco Use   Smoking status: Never   Smokeless tobacco: Never  Vaping Use   Vaping Use: Never  used  Substance and Sexual Activity   Alcohol use: Yes    Alcohol/week: 2.0 standard drinks    Types: 2 Cans of beer per week   Drug use: Never   Sexual activity: Not Currently  Other Topics Concern   Not on file  Social History Narrative   Not on file   Social Determinants of Health   Financial Resource Strain: Not on file  Food Insecurity: Not on file  Transportation Needs: Not on file   Physical Activity: Not on file  Stress: Not on file  Social Connections: Not on file    Allergies: No Known Allergies  Metabolic Disorder Labs: No results found for: HGBA1C, MPG No results found for: PROLACTIN No results found for: CHOL, TRIG, HDL, CHOLHDL, VLDL, LDLCALC No results found for: TSH  Therapeutic Level Labs: No results found for: LITHIUM No results found for: VALPROATE No components found for:  CBMZ  Current Medications: Current Outpatient Medications  Medication Sig Dispense Refill   Erenumab-aooe 140 MG/ML SOAJ Inject into the skin.     Acetaminophen-Codeine 300-30 MG tablet Take 1 tablet by mouth every 4 (four) hours as needed. (Patient not taking: Reported on 03/18/2021)     buPROPion (WELLBUTRIN XL) 300 MG 24 hr tablet Take 1 tablet (300 mg total) by mouth daily. 90 tablet 1   [START ON 08/11/2021] busPIRone (BUSPAR) 30 MG tablet Take 1 tablet (30 mg total) by mouth 2 (two) times daily. 180 tablet 0   butalbital-acetaminophen-caffeine (FIORICET) 50-325-40 MG tablet Take by mouth.     Calcium Carbonate-Vitamin D 600-200 MG-UNIT TABS Take 600 mg by mouth.     cetirizine (ZYRTEC) 10 MG tablet Take by mouth.     cetirizine (ZYRTEC) 10 MG tablet Take by mouth.     divalproex (DEPAKOTE) 250 MG DR tablet Take 250 mg by mouth 3 (three) times daily.     gabapentin (NEURONTIN) 400 MG capsule Take 400 mg by mouth 3 (three) times daily.     Galcanezumab-gnlm 120 MG/ML SOAJ Inject into the skin.     simvastatin (ZOCOR) 20 MG tablet Take 20 mg by mouth daily.     topiramate (TOPAMAX) 100 MG tablet Take 100 mg by mouth 2 (two) times daily.     traMADol (ULTRAM) 50 MG tablet Take by mouth.     No current facility-administered medications for this visit.     Musculoskeletal: Strength & Muscle Tone:  UTA Gait & Station: normal Patient leans: N/A  Psychiatric Specialty Exam: Review of Systems  Neurological:  Positive for headaches (Chronic).  Psychiatric/Behavioral:   Negative for agitation, behavioral problems, confusion, decreased concentration, dysphoric mood, hallucinations, self-injury, sleep disturbance and suicidal ideas. The patient is not nervous/anxious and is not hyperactive.   All other systems reviewed and are negative.  There were no vitals taken for this visit.There is no height or weight on file to calculate BMI.  General Appearance: Casual  Eye Contact:  Fair  Speech:  Clear and Coherent  Volume:  Normal  Mood:  Euthymic  Affect:  Congruent  Thought Process:  Goal Directed and Descriptions of Associations: Intact  Orientation:  Full (Time, Place, and Person)  Thought Content: Logical   Suicidal Thoughts:  No  Homicidal Thoughts:  No  Memory:  Immediate;   Fair Recent;   Fair Remote;   Fair  Judgement:  Fair  Insight:  Fair  Psychomotor Activity:  Normal  Concentration:  Concentration: Fair and Attention Span: Fair  Recall:  Fair  Fund of Knowledge: Fair  Language: Fair  Akathisia:  No  Handed:  Right  AIMS (if indicated): not done  Assets:  Communication Skills Desire for Improvement Housing Social Support  ADL's:  Intact  Cognition: WNL  Sleep:  Fair   Screenings: GAD-7    Flowsheet Row Video Visit from 07/09/2021 in [facility] Video Visit from 05/20/2021 in [facility] Video Visit from 04/08/2021 in [facility] Visit from 02/23/2021 in [facility]  Total GAD-7 Score 0 0 2 7      PHQ2-9    Flowsheet Row Video Visit from 07/09/2021 in [facility] Video Visit from 05/20/2021 in [facility] Video Visit from 04/08/2021 in [facility] Office Visit from 03/18/2021 in [facility] Visit from 02/23/2021 in [facility]  PHQ-2 Total Score 0 0 0 0 3  PHQ-9 Total  Score -- -- -- 2 10      Flowsheet Row Counselor from 03/16/2021 in [facility] Office Visit from 02/23/2021 in [facility] Video Visit from 08/21/2020 in [facility] No Risk No Risk No Risk        Assessment and Plan: Barbara Russo is a 61 year old Caucasian female, single, lives in [place], has a history of depression, binge eating disorder, chronic headaches was evaluated by telemedicine today.  Patient is currently stable.  Plan as noted below.  Plan MDD in remission BuSpar 30 mg p.o. twice daily Wellbutrin XL 300 mg p.o. daily  GAD-stable BuSpar 30 mg p.o. twice daily Continue CBT with Ms. Christina Hussami  Binge eating disorder in remission We will monitor closely  Follow-up in clinic in 2 to 3 months or sooner if needed.  This note was generated in part or whole with voice recognition software. Voice recognition is usually quite accurate but there are transcription errors that can and very often do occur. I apologize for any typographical errors that were not detected and corrected.      Ursula Alert, MD 07/09/2021, 10:26 AM

## 2021-08-16 ENCOUNTER — Telehealth: Payer: Self-pay

## 2021-08-16 DIAGNOSIS — F411 Generalized anxiety disorder: Secondary | ICD-10-CM

## 2021-08-16 MED ORDER — BUSPIRONE HCL 15 MG PO TABS: 15.0000 mg | ORAL_TABLET | Freq: Four times a day (QID) | ORAL | 1 refills | Status: DC

## 2021-08-16 NOTE — Telephone Encounter (Signed)
pt left a message that the buspar '30mg'$  was a leverl 2 drug and is costing her $35.   pt states that the buspar '15mg'$  is a level 1 and is $10.  she wanted to know if you would do '15mg'$  take 4 times a day.  ?

## 2021-08-16 NOTE — Telephone Encounter (Signed)
Changed BuSpar, I have spoken to the patient also.  BuSpar 15 mg-take 4 a day. ?

## 2021-09-23 ENCOUNTER — Telehealth: Payer: BC Managed Care – PPO | Admitting: Psychiatry

## 2021-09-23 ENCOUNTER — Encounter: Payer: Self-pay | Admitting: Psychiatry

## 2021-09-23 DIAGNOSIS — F3342 Major depressive disorder, recurrent, in full remission: Secondary | ICD-10-CM

## 2021-09-23 DIAGNOSIS — F411 Generalized anxiety disorder: Secondary | ICD-10-CM

## 2021-09-23 DIAGNOSIS — F5081 Binge eating disorder: Secondary | ICD-10-CM | POA: Diagnosis not present

## 2021-09-23 NOTE — Progress Notes (Signed)
Virtual Visit via Video Note ? ?I connected with Barbara Russo on 09/23/21 at  8:30 AM EDT by a video enabled telemedicine application and verified that I am speaking with the correct person using two identifiers. ? ?Location ?Provider Location : [facility] ?Patient Location : Home ? ?Participants: Patient , Provider ?  ?I discussed the limitations of evaluation and management by telemedicine and the availability of in person appointments. The patient expressed understanding and agreed to proceed. ? ?  ?I discussed the assessment and treatment plan with the patient. The patient was provided an opportunity to ask questions and all were answered. The patient agreed with the plan and demonstrated an understanding of the instructions. ?  ?The patient was advised to call back or seek an in-person evaluation if the symptoms worsen or if the condition fails to improve as anticipated. ? ? ?[facility] MD OP Progress Note ? ?09/23/2021 8:47 AM ?Barbara Russo  ?MRN:  [id number] ? ?Chief Complaint:  ?Chief Complaint  ?Patient presents with  ? Follow-up : 61 year old Caucasian female with history of MDD, binge eating disorder, generalized anxiety disorder was evaluated for medication management.  ? ?HPI: Barbara Russo is a 61 year old Caucasian female, single, employed, lives in [place], has a history of MDD, binge eating disorder, headaches, hyperlipidemia was evaluated by telemedicine today. ? ?Patient today reports she is currently stable with regards to her mood.  Denies any significant anxiety or sadness. ? ?Reports she is sleeping well. ? ?Reports she is trying to stay active trying to get out more and trying to stay healthy. ? ?Patient reports work is going well. ? ?Reports she is compliant on her medications.  Currently takes the BuSpar in divided dosage of 15 mg.  She is tolerating it well. ? ?Patient denies any suicidality.  Patient denies any homicidality.  Patient denies any perceptual disturbances. ? ?Patient  denies any other concerns today. ? ? ? ?Visit Diagnosis:  ?  ICD-10-CM   ?1. MDD (major depressive disorder), recurrent, in full remission ([place])  F33.42   ?  ?2. Binge eating disorder  F50.81   ?  ?3. GAD (generalized anxiety disorder)  F41.1   ?  ? ? ?Past Psychiatric History: Reviewed past psychiatric history from progress note on 01/30/2018.  Past trials of Valium, Wellbutrin, BuSpar, Ritalin, Depakote, Vyvanse. ? ?Past Medical History:  ?Past Medical History:  ?Diagnosis Date  ? Actinic keratosis   ? Anxiety   ? Depression   ? Dysplastic nevus 10/03/2019  ? L lower back paraspinal mod to severe (shave removal)  ? Dysplastic nevus 09/23/2020  ? R lat breast - moderate  ? Squamous cell carcinoma of skin 09/26/2019  ? SCCIS - L prox lat pretibial   ?  ?Past Surgical History:  ?Procedure Laterality Date  ? BREAST BIOPSY Left 2008  ? neg  ? ? ?Family Psychiatric History: Reviewed family psychiatric history from progress note on 01/30/2018. ? ?Family History:  ?Family History  ?Adopted: Yes  ? ? ?Social History: Reviewed social history from progress note on 01/30/2018. ?Social History  ? ?Socioeconomic History  ? Marital status: Single  ?  Spouse name: Not on file  ? Number of children: 0  ? Years of education: Not on file  ? Highest education level: Bachelor's degree (e.g., BA, AB, BS)  ?Occupational History  ? Not on file  ?Tobacco Use  ? Smoking status: Never  ? Smokeless tobacco: Never  ?Vaping Use  ? Vaping Use: Never used  ?Substance and Sexual Activity  ?  Alcohol use: Yes  ?  Alcohol/week: 2.0 standard drinks  ?  Types: 2 Cans of beer per week  ? Drug use: Never  ? Sexual activity: Not Currently  ?Other Topics Concern  ? Not on file  ?Social History Narrative  ? Not on file  ? ?Social Determinants of Health  ? ?Financial Resource Strain: Not on file  ?Food Insecurity: Not on file  ?Transportation Needs: Not on file  ?Physical Activity: Not on file  ?Stress: Not on file  ?Social Connections: Not on file   ? ? ?Allergies: No Known Allergies ? ?Metabolic Disorder Labs: ?No results found for: HGBA1C, MPG ?No results found for: PROLACTIN ?No results found for: CHOL, TRIG, HDL, CHOLHDL, VLDL, LDLCALC ?No results found for: TSH ? ?Therapeutic Level Labs: ?No results found for: LITHIUM ?No results found for: VALPROATE ?No components found for:  CBMZ ? ?Current Medications: ?Current Outpatient Medications  ?Medication Sig Dispense Refill  ? Acetaminophen-Codeine 300-30 MG tablet Take 1 tablet by mouth every 4 (four) hours as needed. (Patient not taking: Reported on 03/18/2021)    ? buPROPion (WELLBUTRIN XL) 300 MG 24 hr tablet Take 1 tablet (300 mg total) by mouth daily. 90 tablet 1  ? busPIRone (BUSPAR) 15 MG tablet Take 1 tablet (15 mg total) by mouth in the morning, at noon, in the evening, and at bedtime. 360 tablet 1  ? butalbital-acetaminophen-caffeine (FIORICET) 50-325-40 MG tablet Take by mouth.    ? Calcium Carbonate-Vitamin D 600-200 MG-UNIT TABS Take 600 mg by mouth.    ? cetirizine (ZYRTEC) 10 MG tablet Take by mouth.    ? cetirizine (ZYRTEC) 10 MG tablet Take by mouth.    ? divalproex (DEPAKOTE) 250 MG DR tablet Take 250 mg by mouth 3 (three) times daily.    ? Erenumab-aooe 140 MG/ML SOAJ Inject into the skin.    ? gabapentin (NEURONTIN) 400 MG capsule Take 400 mg by mouth 3 (three) times daily.    ? Galcanezumab-gnlm 120 MG/ML SOAJ Inject into the skin.    ? simvastatin (ZOCOR) 20 MG tablet Take 20 mg by mouth daily.    ? topiramate (TOPAMAX) 100 MG tablet Take 100 mg by mouth 2 (two) times daily.    ? traMADol (ULTRAM) 50 MG tablet Take by mouth.    ? ?No current facility-administered medications for this visit.  ? ? ? ?Musculoskeletal: ?Strength & Muscle Tone:  UTA ?Gait & Station:  Seated ?Patient leans: N/A ? ?Psychiatric Specialty Exam: ?Review of Systems  ?Psychiatric/Behavioral:  Negative for agitation, behavioral problems, confusion, decreased concentration, dysphoric mood, hallucinations, self-injury,  sleep disturbance and suicidal ideas. The patient is not nervous/anxious and is not hyperactive.   ?All other systems reviewed and are negative.  ?There were no vitals taken for this visit.There is no height or weight on file to calculate BMI.  ?General Appearance: Casual  ?Eye Contact:  Fair  ?Speech:  Clear and Coherent  ?Volume:  Normal  ?Mood:  Euthymic  ?Affect:  Congruent  ?Thought Process:  Goal Directed and Descriptions of Associations: Intact  ?Orientation:  Full (Time, Place, and Person)  ?Thought Content: Logical   ?Suicidal Thoughts:  No  ?Homicidal Thoughts:  No  ?Memory:  Immediate;   Fair ?Recent;   Fair ?Remote;   Fair  ?Judgement:  Fair  ?Insight:  Fair  ?Psychomotor Activity:  Normal  ?Concentration:  Concentration: Fair and Attention Span: Fair  ?Recall:  Fair  ?Fund of Knowledge: Fair  ?Language: Fair  ?Akathisia:  No  ?Handed:  Right  ?AIMS (if indicated): not done  ?Assets:  Communication Skills ?Desire for Improvement ?Housing ?Social Support  ?ADL's:  Intact  ?Cognition: WNL  ?Sleep:  Fair  ? ?Screenings: ?GAD-7   ? ?Flowsheet Row Video Visit from 07/09/2021 in [facility] Video Visit from 05/20/2021 in [facility] Video Visit from 04/08/2021 in [facility] Office Visit from 02/23/2021 in [facility]  ?Total GAD-7 Score 0 0 2 7  ? ?  ? ?PHQ2-9   ? ?Flowsheet Row Video Visit from 07/09/2021 in [facility] Video Visit from 05/20/2021 in [facility] Video Visit from 04/08/2021 in [facility] Office Visit from 03/18/2021 in [facility] Office Visit from 02/23/2021 in [facility]  ?PHQ-2 Total Score 0 0 0 0 3  ?PHQ-9 Total Score -- -- -- 2 10  ? ?  ? ?Flowsheet Row Counselor from 03/16/2021 in [facility] Office Visit  from 02/23/2021 in [facility] Video Visit from 08/21/2020 in [facility]  ?C-SSRS RISK CATEGORY No Risk No Risk No Risk  ? ?  ? ? ? ?Assessment and Plan: Kavin Leech

## 2021-09-29 ENCOUNTER — Ambulatory Visit: Payer: BC Managed Care – PPO | Admitting: Dermatology

## 2021-09-29 DIAGNOSIS — L570 Actinic keratosis: Secondary | ICD-10-CM | POA: Diagnosis not present

## 2021-09-29 DIAGNOSIS — Z86007 Personal history of in-situ neoplasm of skin: Secondary | ICD-10-CM

## 2021-09-29 DIAGNOSIS — D1800 Hemangioma unspecified site: Secondary | ICD-10-CM

## 2021-09-29 DIAGNOSIS — Z86018 Personal history of other benign neoplasm: Secondary | ICD-10-CM | POA: Diagnosis not present

## 2021-09-29 DIAGNOSIS — L821 Other seborrheic keratosis: Secondary | ICD-10-CM

## 2021-09-29 DIAGNOSIS — D225 Melanocytic nevi of trunk: Secondary | ICD-10-CM | POA: Diagnosis not present

## 2021-09-29 DIAGNOSIS — D492 Neoplasm of unspecified behavior of bone, soft tissue, and skin: Secondary | ICD-10-CM

## 2021-09-29 DIAGNOSIS — L578 Other skin changes due to chronic exposure to nonionizing radiation: Secondary | ICD-10-CM

## 2021-09-29 DIAGNOSIS — Z1283 Encounter for screening for malignant neoplasm of skin: Secondary | ICD-10-CM | POA: Diagnosis not present

## 2021-09-29 DIAGNOSIS — L820 Inflamed seborrheic keratosis: Secondary | ICD-10-CM | POA: Diagnosis not present

## 2021-09-29 DIAGNOSIS — L304 Erythema intertrigo: Secondary | ICD-10-CM | POA: Diagnosis not present

## 2021-09-29 DIAGNOSIS — L988 Other specified disorders of the skin and subcutaneous tissue: Secondary | ICD-10-CM

## 2021-09-29 DIAGNOSIS — L814 Other melanin hyperpigmentation: Secondary | ICD-10-CM

## 2021-09-29 DIAGNOSIS — D229 Melanocytic nevi, unspecified: Secondary | ICD-10-CM

## 2021-09-29 DIAGNOSIS — L2081 Atopic neurodermatitis: Secondary | ICD-10-CM

## 2021-09-29 NOTE — Patient Instructions (Addendum)
Wound Care Instructions ? ?Cleanse wound gently with soap and water once a day then pat dry with clean gauze. Apply a thing coat of Petrolatum (petroleum jelly, "Vaseline") over the wound (unless you have an allergy to this). We recommend that you use a new, sterile tube of Vaseline. Do not pick or remove scabs. Do not remove the yellow or white "healing tissue" from the base of the wound. ? ?Cover the wound with fresh, clean, nonstick gauze and secure with paper tape. You may use Band-Aids in place of gauze and tape if the would is small enough, but would recommend trimming much of the tape off as there is often too much. Sometimes Band-Aids can irritate the skin. ? ?You should call the office for your biopsy report after 1 week if you have not already been contacted. ? ?If you experience any problems, such as abnormal amounts of bleeding, swelling, significant bruising, significant pain, or evidence of infection, please call the office immediately. ? ?FOR ADULT SURGERY PATIENTS: If you need something for pain relief you may take 1 extra strength Tylenol (acetaminophen) AND 2 Ibuprofen ('200mg'$  each) together every 4 hours as needed for pain. (do not take these if you are allergic to them or if you have a reason you should not take them.) Typically, you may only need pain medication for 1 to 3 days.  ? ?Cryotherapy Aftercare ? ?Wash gently with soap and water everyday.   ?Apply Vaseline and Band-Aid daily until healed. Instructions for Skin  ? ? ? ?[facility] Medications ? ?One or more of your medications was sent to the [facility] mail order compounding pharmacy. You will receive an email from them and can purchase the medicine through that link. It will then be mailed to your home at the address you confirmed. If for any reason you do not receive an email from them, please check your spam folder. If you still do not find the email, please let us know. [facility] phone number is [phone].   ? ? ? ? ? ?If You Need Anything After Your Visit ? ?If you have any questions or concerns for your doctor, please call our main line at [phone] and press option 4 to reach your doctor's medical assistant. If no one answers, please leave a voicemail as directed and we will return your call as soon as possible. Messages left after 4 pm will be answered the following business day.  ? ?You may also send [place] a message via MyChart. We typically respond to MyChart messages within 1-2 business days. ? ?For prescription refills, please ask your pharmacy to contact our office. Our fax number is [phone]. ? ?If you have an urgent issue when the clinic is closed that cannot wait until the next business day, you can page your doctor at the number below.   ? ?Please note that while we do our best to be available for urgent issues outside of office hours, we are not available 24/7.  ? ?If you have an urgent issue and are unable to reach [place], you may choose to seek medical care at your doctor's office, retail clinic, urgent care center, or emergency room. ? ?If you have a medical emergency, please immediately call 911 or go to the emergency department. ? ?Pager Numbers ? ?- Dr. Nehemiah Massed: [phone] ? ?- Dr. Laurence Ferrari: [phone] ? ?- Dr. Nicole Kindred: [phone] ? ?In the event of inclement weather, please call our main line at [phone] for an update on the status of  any delays or closures. ? ?Dermatology Medication Tips: ?Please keep the boxes that topical medications come in in order to help keep track of the instructions about where and how to use these. Pharmacies typically print the medication instructions only on the boxes and not directly on the medication tubes.  ? ?If your medication is too expensive, please contact our office at [phone] option 4 or send [place] a message through [facility].  ? ?We are unable to tell what your co-pay for medications will be in advance as this is different depending on your insurance  coverage. However, we may be able to find a substitute medication at lower cost or fill out paperwork to get insurance to cover a needed medication.  ? ?If a prior authorization is required to get your medication covered by your insurance company, please allow [place] 1-2 business days to complete this process. ? ?Drug prices often vary depending on where the prescription is filled and some pharmacies may offer cheaper prices. ? ?The website www.goodrx.com contains coupons for medications through different pharmacies. The prices here do not account for what the cost may be with help from insurance (it may be cheaper with your insurance), but the website can give you the price if you did not use any insurance.  ?- You can print the associated coupon and take it with your prescription to the pharmacy.  ?- You may also stop by our office during regular business hours and pick up a GoodRx coupon card.  ?- If you need your prescription sent electronically to a different pharmacy, notify our office through [facility] or by phone at [phone] option 4. ? ? ? ? ?Si Usted Necesita Algo Despu?s de Su Visita ? ?Tambi?n puede enviarnos un mensaje a trav?s de MyChart. Por lo general respondemos a los mensajes de MyChart en el transcurso de 1 a 2 d?as h?biles. ? ?Para renovar recetas, por favor pida a su farmacia que se ponga en contacto con nuestra oficina. Nuestro n?mero de fax es el [phone]. ? ?Si tiene un asunto urgente cuando la cl?nica est? cerrada y que no puede esperar hasta el siguiente d?a h?bil, puede llamar/localizar a su doctor(a) al n?mero que aparece a continuaci?n.  ? ?Por favor, tenga en cuenta que aunque hacemos todo lo posible para estar disponibles para asuntos urgentes fuera del horario de oficina, no estamos disponibles las 24 horas del d?a, los 7 d?as de la semana.  ? ?Si tiene un problema urgente y no puede comunicarse con nosotros, puede optar por buscar atenci?n m?dica  en el consultorio de  su doctor(a), en una cl?nica privada, en un centro de atenci?n urgente o en una sala de emergencias. ? ?Si tiene Engineer, maintenance (IT) m?dica, por favor llame inmediatamente al 911 o vaya a la sala de emergencias. ? ?N?meros de b?per ? ?- Dr. Nehemiah Massed: [phone] ? ?- Dra. Moye: [phone] ? ?- Dra. Nicole Kindred: [phone] ? ?En caso de inclemencias del tiempo, por favor llame a nuestra l?nea principal al [phone] para una actualizaci?n sobre el estado de cualquier retraso o cierre. ? ?Consejos para la medicaci?n en dermatolog?a: ?Por favor, guarde las cajas en las que vienen los medicamentos de uso t?pico para ayudarle a seguir las instrucciones sobre d?nde y c?mo usarlos. Las farmacias generalmente imprimen las instrucciones del medicamento s?lo en las cajas y no directamente en los tubos del [place].  ? ?Si su medicamento es muy caro, por favor, p?ngase en contacto con Zigmund Daniel llamando al [phone] y presione la  opci?n 4 o env?enos un mensaje a trav?s de MyChart.  ? ?No podemos decirle cu?l ser? su copago por los medicamentos por adelantado ya que esto es diferente dependiendo de la cobertura de su seguro. Sin embargo, es posible que podamos encontrar un medicamento sustituto a Electrical engineer un formulario para que el seguro cubra el medicamento que se considera necesario.  ? ?Si se requiere Ardelia Mems autorizaci?n previa para que su compa??a de seguros [place] su medicamento, por favor perm?tanos de 1 a 2 d?as h?biles para completar este proceso. ? ?Los precios de los medicamentos var?an con frecuencia dependiendo del Environmental consultant de d?nde se surte la receta y alguna farmacias pueden ofrecer precios m?s baratos. ? ?El sitio web www.goodrx.com tiene cupones para medicamentos de Airline pilot. Los precios aqu? no tienen en cuenta lo que podr?a costar con la ayuda del seguro (puede ser m?s barato con su seguro), pero el sitio web puede darle el precio si no utiliz? ning?n seguro.  ?- Puede imprimir el  cup?n correspondiente y llevarlo con su receta a la farmacia.  ?- Tambi?n puede pasar por nuestra oficina durante el horario de atenci?n regular y recoger una tarjeta de cupones de GoodRx.  ?- Si necesita que su recet

## 2021-09-29 NOTE — Progress Notes (Signed)
? ?Follow-Up Visit ?  ?Subjective  ?Barbara Russo is a 61 y.o. female who presents for the following: Total body skin exam (Hx of SCCIS L prox lat pretibial, Dysplastic Nevi L lower back paraspinal, R lat breast, Aks). ?The patient presents for Total-Body Skin Exam (TBSE) for skin cancer screening and mole check.  The patient has spots, moles and lesions to be evaluated, some may be new or changing and the patient has concerns that these could be cancer.  ? ?The following portions of the chart were reviewed this encounter and updated as appropriate:  ? Tobacco  Allergies  Meds  Problems  Med Hx  Surg Hx  Fam Hx   ?  ?Review of Systems:  No other skin or systemic complaints except as noted in HPI or Assessment and Plan. ? ?Objective  ?Well appearing patient in no apparent distress; mood and affect are within normal limits. ? ?A full examination was performed including scalp, head, eyes, ears, nose, lips, neck, chest, axillae, abdomen, back, buttocks, bilateral upper extremities, bilateral lower extremities, hands, feet, fingers, toes, fingernails, and toenails. All findings within normal limits unless otherwise noted below. ? ?R lat eyebrow x 1 ?Pink scaly macules ? ?Left Upper Back 5.5cm lat to spine, medial to mid scapula ?0.6 dark brown macule ? ?ankles ?Ankles with mild scale and erythema ? ?R cheek x 1 ?Stuck on waxy paps with erythema ? ?face ?Rhytides and volume loss.  ? ? ?Assessment & Plan  ? ?History of Squamous Cell Carcinoma in Situ of the Skin ?- No evidence of recurrence today ?- Recommend regular full body skin exams ?- Recommend daily broad spectrum sunscreen SPF 30+ to sun-exposed areas, reapply every 2 hours as needed.  ?- Call if any new or changing lesions are noted between office visits  ?- L prox lat pretibial ? ?History of Dysplastic Nevi ?- No evidence of recurrence today ?- Recommend regular full body skin exams ?- Recommend daily broad spectrum sunscreen SPF 30+ to sun-exposed  areas, reapply every 2 hours as needed.  ?- Call if any new or changing lesions are noted between office visits  ?- L lower back paraspinal, R lat breast ? ?Lentigines ?- Scattered tan macules ?- Due to sun exposure ?- Benign-appearing, observe ?- Recommend daily broad spectrum sunscreen SPF 30+ to sun-exposed areas, reapply every 2 hours as needed. ?- Call for any changes ? ?Seborrheic Keratoses ?- Stuck-on, waxy, tan-brown papules and/or plaques  ?- Benign-appearing ?- Discussed benign etiology and prognosis. ?- Observe ?- Call for any changes ?- trunk ? ?Melanocytic Nevi ?- Tan-brown and/or pink-flesh-colored symmetric macules and papules ?- Benign appearing on exam today ?- Observation ?- Call clinic for new or changing moles ?- Recommend daily use of broad spectrum spf 30+ sunscreen to sun-exposed areas.  ?- arms, trunk ? ?Hemangiomas ?- Red papules ?- Discussed benign nature ?- Observe ?- Call for any changes ? ?Actinic Damage ?- Chronic condition, secondary to cumulative UV/sun exposure ?- diffuse scaly erythematous macules with underlying dyspigmentation ?- Recommend daily broad spectrum sunscreen SPF 30+ to sun-exposed areas, reapply every 2 hours as needed.  ?- Staying in the shade or wearing long sleeves, sun glasses (UVA+UVB protection) and wide brim hats (4-inch brim around the entire circumference of the hat) are also recommended for sun protection.  ?- Call for new or changing lesions. ? ?Skin cancer screening performed today. ? ?AK (actinic keratosis) ?R lat eyebrow x  ?Destruction of lesion - R lat eyebrow x 1 ?Complexity:  simple   ?Destruction method: cryotherapy   ?Informed consent: discussed and consent obtained   ?Timeout:  patient name, date of birth, surgical site, and procedure verified ?Lesion destroyed using liquid nitrogen: Yes   ?Region frozen until ice ball extended beyond lesion: Yes   ?Outcome: patient tolerated procedure well with no complications   ?Post-procedure details: wound care  instructions given   ? ?Neoplasm of skin ?Left Upper Back 5.5cm lat to spine, medial to mid scapula ?Epidermal / dermal shaving ? ?Lesion diameter (cm):  0.6 ?Informed consent: discussed and consent obtained   ?Timeout: patient name, date of birth, surgical site, and procedure verified   ?Procedure prep:  Patient was prepped and draped in usual sterile fashion ?Prep type:  Isopropyl alcohol ?Anesthesia: the lesion was anesthetized in a standard fashion   ?Anesthetic:  1% lidocaine w/ epinephrine 1-100,000 buffered w/ 8.4% NaHCO3 ?Instrument used: flexible razor blade   ?Hemostasis achieved with: pressure, aluminum chloride and electrodesiccation   ?Outcome: patient tolerated procedure well   ?Post-procedure details: sterile dressing applied and wound care instructions given   ?Dressing type: bandage and petrolatum   ? ?Specimen 1 - Surgical pathology ?Differential Diagnosis: D48.5 Nevus vs Dysplastic Nevus ?Check Margins: yes ?0.6 dark brown macule ? ?Intertrigo ?inframammary ?Intertrigo is a chronic recurrent rash that occurs in skin fold areas that may be associated with friction; heat; moisture; yeast; fungus; and bacteria.  It is exacerbated by increased movement / activity; sweating; and higher atmospheric temperature. ? ?Start [facility] Iodoquinol 1%, Hydrocortisone 2.5%, Niacinamide 2% Cream twice a day to affected areas for up to two weeks.  The patient was advised this is not covered by insurance since it is made by a compounding pharmacy. They will receive an email to check out and the medication will be mailed to their home.   ? ?Atopic neurodermatitis ?ankles ?With Pruritis ?Atopic dermatitis (eczema) is a chronic, relapsing, pruritic condition that can significantly affect quality of life. It is often associated with allergic rhinitis and/or asthma and can require treatment with topical medications, phototherapy, or in severe cases biologic injectable medication (Dupixent; Adbry) or Oral JAK  inhibitors.  ? ?May try the SM Intertrigo cream qhs if not improving with that can send in Mometasone cream ? ?Inflamed seborrheic keratosis ?R cheek x 1 ? ?Persistent ? ?Destruction of lesion - R cheek x 1 ?Complexity: simple   ?Destruction method: cryotherapy   ?Informed consent: discussed and consent obtained   ?Timeout:  patient name, date of birth, surgical site, and procedure verified ?Lesion destroyed using liquid nitrogen: Yes   ?Region frozen until ice ball extended beyond lesion: Yes   ?Outcome: patient tolerated procedure well with no complications   ?Post-procedure details: wound care instructions given   ? ?Elastosis of skin ?face ?Discussed the treatment option of BBL/laser.  Typically we recommend 1-3 treatment sessions about 5-8 weeks apart for best results.  The patient's condition may require "maintenance treatments" in the future.  The fee for BBL / laser treatments is $350 per treatment session for the whole face.  A fee can be quoted for other parts of the body. ?Insurance typically does not pay for BBL/laser treatments and therefore the fee is an out-of-pocket cost. ? ?Finish SM Tretinoin 0.25% with Niacinamide, then pt can start  ?[facility] Anti-Aging Tretinoin 0.025%/Niacinamide/Vitamin C/Vitamin E/Turmeric/Resveratrol with Hyaluronic Acid. Apply pea sized amount nightly to the entire face.  The patient was advised this is not covered by insurance since it is made by  a Journalist, newspaper. They will receive an email to check out and the medication will be mailed to their home.  ? ?Topical retinoid medications like tretinoin can cause dryness and irritation when first started. Only apply a pea-sized amount to the entire affected area. Avoid applying it around the eyes, edges of mouth and creases at the nose. If you experience irritation, use a good moisturizer first and/or apply the medicine less often. If you are doing well with the medicine, you can increase how often you use it until  you are applying every night. Be careful with sun protection while using this medication as it can make you sensitive to the sun. This medicine should not be used by pregnant women.   ? ?Skin cancer screening ?

## 2021-10-04 ENCOUNTER — Telehealth: Payer: Self-pay

## 2021-10-04 NOTE — Telephone Encounter (Signed)
Advised pt of bx results/sh ?

## 2021-10-04 NOTE — Telephone Encounter (Signed)
-----   Message from Ralene Bathe, MD sent at 10/01/2021  5:32 PM EDT ----- ?Diagnosis ?Skin , left upper back 5.5cm lat to spine, medial to mid scapula ?DYSPLASTIC JUNCTIONAL NEVUS WITH MODERATE ATYPIA, PERIPHERAL MARGIN INVOLVED ? ?Moderate dysplastic ?Recheck next visit ?

## 2021-10-08 ENCOUNTER — Encounter: Payer: Self-pay | Admitting: Dermatology

## 2021-12-06 ENCOUNTER — Telehealth: Payer: Self-pay

## 2021-12-07 NOTE — Telephone Encounter (Signed)
Attempted to contact patient. Left voicemail.

## 2022-01-12 ENCOUNTER — Ambulatory Visit: Payer: BC Managed Care – PPO | Admitting: Psychiatry

## 2022-01-19 ENCOUNTER — Other Ambulatory Visit: Payer: Self-pay | Admitting: Psychiatry

## 2022-01-19 DIAGNOSIS — F3342 Major depressive disorder, recurrent, in full remission: Secondary | ICD-10-CM

## 2022-02-10 ENCOUNTER — Ambulatory Visit: Payer: BC Managed Care – PPO | Admitting: Psychiatry

## 2022-02-10 ENCOUNTER — Encounter: Payer: Self-pay | Admitting: Psychiatry

## 2022-02-10 VITALS — BP 129/79 | HR 94 | Temp 98.3°F | Wt 171.8 lb

## 2022-02-10 DIAGNOSIS — F411 Generalized anxiety disorder: Secondary | ICD-10-CM | POA: Diagnosis not present

## 2022-02-10 DIAGNOSIS — F3342 Major depressive disorder, recurrent, in full remission: Secondary | ICD-10-CM

## 2022-02-10 DIAGNOSIS — F5081 Binge eating disorder: Secondary | ICD-10-CM | POA: Diagnosis not present

## 2022-02-10 MED ORDER — BUSPIRONE HCL 15 MG PO TABS: 15.0000 mg | ORAL_TABLET | Freq: Four times a day (QID) | ORAL | 1 refills | Status: DC

## 2022-02-10 NOTE — Progress Notes (Signed)
[facility] MD OP Progress Note  02/10/2022 7:22 PM Barbara Russo  MRN:  [id number]  Chief Complaint:  Chief Complaint  Patient presents with   Follow-up: 61 year old Caucasian female with history of MDD, binge eating disorder, anxiety was evaluated for medication management.   HPI: Barbara Russo is a 61 year old Caucasian female, single, employed, lives in [place], has a history of MDD, binge eating disorder, headaches, hyperlipidemia was evaluated in office today.  Patient today reports she is currently doing well on the current medication regimen.  Denies any significant sadness, anxiety.  Reports appetite is fair.  She currently tries to walk at least twice a day, she walks her dogs.  She is interested in weight loss.  Planning to watch her diet and may be exercise more.  Reports she has a low-grade headache, currently following up with her provider for the same.  She tried  massage.  Seems to be helpful.  Currently compliant on the Wellbutrin, BuSpar higher dosage, seems to be beneficial.  Denies side effects.  Denies any suicidality, homicidality or perceptual disturbances.  Reports she enjoys her work, that keeps her occupied.  Continues to have support system from her brother and family.  One of her friends is coming in to visit for a few days.  She looks forward to that.  Denies any other concerns today.    Visit Diagnosis:    ICD-10-CM   1. MDD (major depressive disorder), recurrent, in full remission ([place])  F33.42     2. Binge eating disorder  F50.81     3. GAD (generalized anxiety disorder)  F41.1 busPIRone (BUSPAR) 15 MG tablet      Past Psychiatric History: Reviewed past psychiatric history from progress note on 01/30/2018.  Past trials of BuSpar, Valium, Wellbutrin, Ritalin, Depakote, Vyvanse.  Past Medical History:  Past Medical History:  Diagnosis Date   Actinic keratosis    Anxiety    Depression    Dysplastic nevus 10/03/2019   L lower back  paraspinal mod to severe (shave removal)   Dysplastic nevus 09/23/2020   R lat breast - moderate   Dysplastic nevus 09/29/2021   Left Upper Back 5.5cm lat to spine, medial to mid scapula, moderate atypia   Squamous cell carcinoma of skin 09/26/2019   SCCIS - L prox lat pretibial     Past Surgical History:  Procedure Laterality Date   BREAST BIOPSY Left 2008   neg    Family Psychiatric History: Reviewed family psychiatric history from progress note on 01/30/2018.  Family History:  Family History  Adopted: Yes    Social History: Reviewed social history from progress note on 01/30/2018. Social History   Socioeconomic History   Marital status: Single    Spouse name: Not on file   Number of children: 0   Years of education: Not on file   Highest education level: Bachelor's degree (e.g., BA, AB, BS)  Occupational History   Not on file  Tobacco Use   Smoking status: Never   Smokeless tobacco: Never  Vaping Use   Vaping Use: Never used  Substance and Sexual Activity   Alcohol use: Yes    Alcohol/week: 2.0 standard drinks of alcohol    Types: 2 Cans of beer per week   Drug use: Never   Sexual activity: Not Currently  Other Topics Concern   Not on file  Social History Narrative   Not on file   Social Determinants of Health   Financial Resource Strain: Low Risk  (01/30/2018)  Overall Financial Resource Strain (CARDIA)    Difficulty of Paying Living Expenses: Not hard at all  Food Insecurity: No Food Insecurity (01/30/2018)   Hunger Vital Sign    Worried About Running Out of Food in the Last Year: Never true    Ran Out of Food in the Last Year: Never true  Transportation Needs: No Transportation Needs (01/30/2018)   PRAPARE - Hydrologist (Medical): No    Lack of Transportation (Non-Medical): No  Physical Activity: Inactive (01/30/2018)   Exercise Vital Sign    Days of Exercise per Week: 0 days    Minutes of Exercise per Session: 0 min   Stress: No Stress Concern Present (01/30/2018)   [place]    Feeling of Stress : Not at all  Social Connections: Moderately Isolated (01/30/2018)   Social Connection and Isolation Panel [NHANES]    Frequency of Communication with Friends and Family: More than three times a week    Frequency of Social Gatherings with Friends and Family: Never    Attends Religious Services: Never    Marine scientist or Organizations: No    Attends Music therapist: Never    Marital Status: Never married    Allergies: No Known Allergies  Metabolic Disorder Labs: No results found for: "HGBA1C", "MPG" No results found for: "PROLACTIN" No results found for: "CHOL", "TRIG", "HDL", "CHOLHDL", "VLDL", "LDLCALC" No results found for: "TSH"  Therapeutic Level Labs: No results found for: "LITHIUM" No results found for: "VALPROATE" No results found for: "CBMZ"  Current Medications: Current Outpatient Medications  Medication Sig Dispense Refill   Acetaminophen-Codeine 300-30 MG tablet Take 1 tablet by mouth every 4 (four) hours as needed.     botulinum toxin Type A (BOTOX) 200 units injection Inject into the muscle.     buPROPion (WELLBUTRIN XL) 300 MG 24 hr tablet TAKE 1 TABLET(300 MG) BY MOUTH DAILY 90 tablet 1   butalbital-acetaminophen-caffeine (FIORICET) 50-325-40 MG tablet Take by mouth.     Calcium Carbonate-Vitamin D 600-200 MG-UNIT TABS Take 600 mg by mouth.     cetirizine (ZYRTEC) 10 MG tablet Take by mouth 2 (two) times daily.     divalproex (DEPAKOTE) 250 MG DR tablet Take 250 mg by mouth 3 (three) times daily.     gabapentin (NEURONTIN) 400 MG capsule Take 400 mg by mouth 3 (three) times daily.     simvastatin (ZOCOR) 20 MG tablet Take 20 mg by mouth daily.     topiramate (TOPAMAX) 100 MG tablet Take 100 mg by mouth 2 (two) times daily.     traMADol (ULTRAM) 50 MG tablet Take by mouth.     busPIRone (BUSPAR)  15 MG tablet Take 1 tablet (15 mg total) by mouth in the morning, at noon, in the evening, and at bedtime. 360 tablet 1   No current facility-administered medications for this visit.     Musculoskeletal: Strength & Muscle Tone: within normal limits Gait & Station: normal Patient leans: N/A  Psychiatric Specialty Exam: Review of Systems  Neurological:  Positive for headaches.  Psychiatric/Behavioral: Negative.    All other systems reviewed and are negative.   Blood pressure 129/79, pulse 94, temperature 98.3 F (36.8 C), temperature source Temporal, weight 171 lb 12.8 oz (77.9 kg).Body mass index is 24.65 kg/m.  General Appearance: Casual  Eye Contact:  Fair  Speech:  Clear and Coherent  Volume:  Normal  Mood:  Euthymic  Affect:  Congruent  Thought Process:  Goal Directed and Descriptions of Associations: Intact  Orientation:  Full (Time, Place, and Person)  Thought Content: Logical   Suicidal Thoughts:  No  Homicidal Thoughts:  No  Memory:  Immediate;   Fair Recent;   Fair Remote;   Fair  Judgement:  Fair  Insight:  Fair  Psychomotor Activity:  Normal  Concentration:  Concentration: Fair and Attention Span: Fair  Recall:  [employer] of Knowledge: Fair  Language: Fair  Akathisia:  No  Handed:  Right  AIMS (if indicated): done  Assets:  Communication Skills Desire for Improvement Housing Social Support  ADL's:  Intact  Cognition: WNL  Sleep:  Fair   Screenings: GAD-7    [facility] Office Visit from 02/10/2022 in [facility] Video Visit from 07/09/2021 in [facility] Video Visit from 05/20/2021 in [facility] Video Visit from 04/08/2021 in [facility] Office Visit from 02/23/2021 in [facility]  Total GAD-7 Score 0 0 0 2 7      PHQ2-9    [place] Visit from 02/10/2022 in [facility] Video Visit from 07/09/2021 in [facility] Video Visit from 05/20/2021 in [facility] Video Visit from 04/08/2021 in [facility] Visit from 03/18/2021 in [facility]  PHQ-2 Total Score 1 0 0 0 0  PHQ-9 Total Score -- -- -- -- 2      [place] Visit from 02/10/2022 in [facility] from 03/16/2021 in [facility] Visit from 02/23/2021 in [facility] No Risk No Risk No Risk        Assessment and Plan: Laquinta Hazell is a 61 year old Caucasian female, single, lives in [place], has a history of depression, binge eating disorder, chronic headache was evaluated in office today.  Patient is currently stable, plan as noted below.  Plan MDD in remission BuSpar 15 mg p.o. 4 times daily Wellbutrin XL 300 mg p.o. daily  GAD-stable BuSpar 15 mg p.o. 4 times daily Continue CBT with Ms. Christina Hussami  Binge eating disorder in remission Will monitor closely  Patient to follow-up with neurology for headaches.  Discussed lifestyle modification, diet management, exercise routine for weight loss.  Follow-up in clinic in 4 to 5 months or sooner if needed.   This note was generated in part or whole with voice recognition software. Voice recognition is usually quite accurate but there are transcription errors that can and very often do occur. I apologize for any typographical errors that were not detected and corrected.      Ursula Alert, MD 02/10/2022, 7:22 PM

## 2022-03-01 ENCOUNTER — Ambulatory Visit
Payer: BC Managed Care – PPO | Attending: Student in an Organized Health Care Education/Training Program | Admitting: Student in an Organized Health Care Education/Training Program

## 2022-03-01 ENCOUNTER — Encounter: Payer: Self-pay | Admitting: Student in an Organized Health Care Education/Training Program

## 2022-03-01 VITALS — BP 109/75 | HR 87 | Temp 97.5°F | Resp 17 | Ht 70.5 in | Wt 167.7 lb

## 2022-03-01 DIAGNOSIS — M47812 Spondylosis without myelopathy or radiculopathy, cervical region: Secondary | ICD-10-CM | POA: Diagnosis present

## 2022-03-01 DIAGNOSIS — M5481 Occipital neuralgia: Secondary | ICD-10-CM | POA: Insufficient documentation

## 2022-03-01 DIAGNOSIS — M5441 Lumbago with sciatica, right side: Secondary | ICD-10-CM | POA: Diagnosis present

## 2022-03-01 DIAGNOSIS — G8929 Other chronic pain: Secondary | ICD-10-CM | POA: Diagnosis present

## 2022-03-01 DIAGNOSIS — M5030 Other cervical disc degeneration, unspecified cervical region: Secondary | ICD-10-CM | POA: Insufficient documentation

## 2022-03-01 DIAGNOSIS — M5442 Lumbago with sciatica, left side: Secondary | ICD-10-CM | POA: Insufficient documentation

## 2022-03-01 DIAGNOSIS — G43719 Chronic migraine without aura, intractable, without status migrainosus: Secondary | ICD-10-CM | POA: Insufficient documentation
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
## 2022-03-01 NOTE — Progress Notes (Signed)
Safety precautions to be maintained throughout the outpatient stay will include: orient to surroundings, keep bed in low position, maintain call bell within reach at all times, provide assistance with transfer out of bed and ambulation.  

## 2022-03-04 ENCOUNTER — Encounter: Payer: Self-pay | Admitting: Student in an Organized Health Care Education/Training Program

## 2022-03-09 ENCOUNTER — Ambulatory Visit
Admission: RE | Admit: 2022-03-09 | Discharge: 2022-03-09 | Disposition: A | Discharge status: Home / Self Care | Payer: BC Managed Care – PPO | Attending: Student in an Organized Health Care Education/Training Program | Admitting: Student in an Organized Health Care Education/Training Program

## 2022-03-09 ENCOUNTER — Ambulatory Visit
Admission: RE | Admit: 2022-03-09 | Discharge: 2022-03-09 | Disposition: A | Discharge status: Home / Self Care | Payer: BC Managed Care – PPO | Source: Ambulatory Visit | Attending: Student in an Organized Health Care Education/Training Program | Admitting: Student in an Organized Health Care Education/Training Program

## 2022-03-09 DIAGNOSIS — M5442 Lumbago with sciatica, left side: Secondary | ICD-10-CM | POA: Diagnosis present

## 2022-03-09 DIAGNOSIS — G8929 Other chronic pain: Secondary | ICD-10-CM | POA: Diagnosis present

## 2022-03-09 DIAGNOSIS — M5441 Lumbago with sciatica, right side: Secondary | ICD-10-CM | POA: Diagnosis present

## 2022-03-15 ENCOUNTER — Telehealth: Payer: Self-pay | Admitting: *Deleted

## 2022-03-15 NOTE — Telephone Encounter (Signed)
Informed patient of x-ray results and Dr. Elwyn Lade recommendation. She agrees to fill out the insurance paperwork in order to receive Botox. Paperwork mailed to patient.

## 2022-03-15 NOTE — Telephone Encounter (Signed)
-----   Message from Gillis Santa, MD sent at 03/15/2022  8:51 AM EDT ----- Please call patient with x-ray results.  Do not recommend any follow-up with lumbar MRI.  Low back pain is likely related to musculoskeletal etiology.  Recommend stretching, physical therapy.  Also let patient know that we are unable to offer Botox to her here.  Her paperwork is very extensive.  If she would like to complete herself she can. ----- Message ----- From: Interface, Rad Results In Sent: 03/10/2022  10:38 PM EDT To: Gillis Santa, MD

## 2022-04-15 ENCOUNTER — Other Ambulatory Visit: Payer: Self-pay | Admitting: Gastroenterology

## 2022-04-15 DIAGNOSIS — R7401 Elevation of levels of liver transaminase levels: Secondary | ICD-10-CM

## 2022-04-15 DIAGNOSIS — R160 Hepatomegaly, not elsewhere classified: Secondary | ICD-10-CM

## 2022-04-19 ENCOUNTER — Ambulatory Visit: Payer: BC Managed Care – PPO

## 2022-04-21 ENCOUNTER — Ambulatory Visit: Payer: BC Managed Care – PPO

## 2022-06-03 ENCOUNTER — Other Ambulatory Visit: Payer: Self-pay | Admitting: Physician Assistant

## 2022-06-03 DIAGNOSIS — Z1231 Encounter for screening mammogram for malignant neoplasm of breast: Secondary | ICD-10-CM

## 2022-06-16 ENCOUNTER — Telehealth: Payer: BC Managed Care – PPO | Admitting: Psychiatry

## 2022-06-21 ENCOUNTER — Ambulatory Visit: Payer: BC Managed Care – PPO | Attending: Student in an Organized Health Care Education/Training Program

## 2022-06-22 DIAGNOSIS — G43701 Chronic migraine without aura, not intractable, with status migrainosus: Secondary | ICD-10-CM | POA: Insufficient documentation

## 2022-06-22 DIAGNOSIS — R52 Pain, unspecified: Secondary | ICD-10-CM | POA: Insufficient documentation

## 2022-06-23 ENCOUNTER — Encounter: Payer: Self-pay | Admitting: Psychiatry

## 2022-06-23 ENCOUNTER — Telehealth (INDEPENDENT_AMBULATORY_CARE_PROVIDER_SITE_OTHER): Payer: BC Managed Care – PPO | Admitting: Psychiatry

## 2022-06-23 ENCOUNTER — Ambulatory Visit: Payer: BC Managed Care – PPO

## 2022-06-23 DIAGNOSIS — F411 Generalized anxiety disorder: Secondary | ICD-10-CM | POA: Diagnosis not present

## 2022-06-23 DIAGNOSIS — F3342 Major depressive disorder, recurrent, in full remission: Secondary | ICD-10-CM | POA: Diagnosis not present

## 2022-06-23 DIAGNOSIS — J309 Allergic rhinitis, unspecified: Secondary | ICD-10-CM | POA: Insufficient documentation

## 2022-06-23 DIAGNOSIS — F5081 Binge eating disorder: Secondary | ICD-10-CM

## 2022-06-23 NOTE — Progress Notes (Signed)
Virtual Visit via Video Note  I connected with Barbara Russo on 06/23/22 at  1:30 PM EST by a video enabled telemedicine application and verified that I am speaking with the correct person using two identifiers.  Location Provider Location : [facility] Patient Location : Home  Participants: Patient , Provider   I discussed the limitations of evaluation and management by telemedicine and the availability of in person appointments. The patient expressed understanding and agreed to proceed.  I discussed the assessment and treatment plan with the patient. The patient was provided an opportunity to ask questions and all were answered. The patient agreed with the plan and demonstrated an understanding of the instructions.   The patient was advised to call back or seek an in-person evaluation if the symptoms worsen or if the condition fails to improve as anticipated.    [facility] MD OP Progress Note  06/24/2022 8:29 AM Barbara Russo  MRN:  [id number]  Chief Complaint:  Chief Complaint  Patient presents with   Follow-up   Anxiety   Medication Refill   Depression   HPI: Barbara Russo is a 62 year old Caucasian female, single, employed, lives in [place], has a history of MDD, binge eating disorder, headaches, hyperlipidemia was evaluated by telemedicine today.  Patient today reports she continues to have low-grade headaches.  She is currently working with her headache specialist.  She was able to have an initial visit with a headache specialist at [facility].  Patient reports she has an appointment in 3 weeks from now for further management of the same.  Looks forward to that.  Patient reports overall BuSpar and Wellbutrin is managing her mood symptoms.  Denies any significant depression or anxiety symptoms.  Reports sleep is good.  Patient reports she has been trying to be socially active, she is part of her dinner group and that has been helpful.  Patient continues to  enjoy walking her dogs.  Patient denies any suicidality, homicidality or perceptual disturbances.  Patient denies any other concerns today.  Visit Diagnosis:    ICD-10-CM   1. MDD (major depressive disorder), recurrent, in full remission ([place])  F33.42     2. Binge eating disorder  F50.81     3. GAD (generalized anxiety disorder)  F41.1       Past Psychiatric History: Reviewed past psychiatric history from progress note on 01/30/2018.  Past trials of BuSpar, Valium, Wellbutrin, Ritalin, Depakote, Vyvanse  Past Medical History:  Past Medical History:  Diagnosis Date   Actinic keratosis    Anxiety    Depression    Dysplastic nevus 10/03/2019   L lower back paraspinal mod to severe (shave removal)   Dysplastic nevus 09/23/2020   R lat breast - moderate   Dysplastic nevus 09/29/2021   Left Upper Back 5.5cm lat to spine, medial to mid scapula, moderate atypia   Squamous cell carcinoma of skin 09/26/2019   SCCIS - L prox lat pretibial     Past Surgical History:  Procedure Laterality Date   BREAST BIOPSY Left 2008   neg    Family Psychiatric History: Reviewed family psychiatric history from progress note on 01/30/2018.  Family History:  Family History  Adopted: Yes    Social History: Reviewed social history from progress note on 01/30/2018. Social History   Socioeconomic History   Marital status: Single    Spouse name: Not on file   Number of children: 0   Years of education: Not on file   Highest education level: Bachelor's degree (  e.g., BA, AB, BS)  Occupational History   Not on file  Tobacco Use   Smoking status: Never   Smokeless tobacco: Never  Vaping Use   Vaping Use: Never used  Substance and Sexual Activity   Alcohol use: Yes    Alcohol/week: 2.0 standard drinks of alcohol    Types: 2 Cans of beer per week   Drug use: Never   Sexual activity: Not Currently  Other Topics Concern   Not on file  Social History Narrative   Not on file   Social  Determinants of Health   Financial Resource Strain: Low Risk  (01/30/2018)   Overall Financial Resource Strain (CARDIA)    Difficulty of Paying Living Expenses: Not hard at all  Food Insecurity: No Food Insecurity (01/30/2018)   Hunger Vital Sign    Worried About Running Out of Food in the Last Year: Never true    Ran Out of Food in the Last Year: Never true  Transportation Needs: No Transportation Needs (01/30/2018)   PRAPARE - Hydrologist (Medical): No    Lack of Transportation (Non-Medical): No  Physical Activity: Inactive (01/30/2018)   Exercise Vital Sign    Days of Exercise per Week: 0 days    Minutes of Exercise per Session: 0 min  Stress: No Stress Concern Present (01/30/2018)   [place]    Feeling of Stress : Not at all  Social Connections: Moderately Isolated (01/30/2018)   Social Connection and Isolation Panel [NHANES]    Frequency of Communication with Friends and Family: More than three times a week    Frequency of Social Gatherings with Friends and Family: Never    Attends Religious Services: Never    Marine scientist or Organizations: No    Attends Music therapist: Never    Marital Status: Never married    Allergies: No Known Allergies  Metabolic Disorder Labs: No results found for: "HGBA1C", "MPG" No results found for: "PROLACTIN" No results found for: "CHOL", "TRIG", "HDL", "CHOLHDL", "VLDL", "LDLCALC" No results found for: "TSH"  Therapeutic Level Labs: No results found for: "LITHIUM" No results found for: "VALPROATE" No results found for: "CBMZ"  Current Medications: Current Outpatient Medications  Medication Sig Dispense Refill   buPROPion (WELLBUTRIN XL) 300 MG 24 hr tablet TAKE 1 TABLET(300 MG) BY MOUTH DAILY 90 tablet 1   busPIRone (BUSPAR) 15 MG tablet Take 1 tablet (15 mg total) by mouth in the morning, at noon, in the evening, and at  bedtime. 360 tablet 1   butalbital-acetaminophen-caffeine (FIORICET) 50-325-40 MG tablet Take by mouth.     Calcium Carbonate-Vitamin D 600-200 MG-UNIT TABS Take 600 mg by mouth.     cetirizine (ZYRTEC) 10 MG tablet Take by mouth 2 (two) times daily.     divalproex (DEPAKOTE) 250 MG DR tablet Take 250 mg by mouth 3 (three) times daily.     gabapentin (NEURONTIN) 400 MG capsule Take 400 mg by mouth 3 (three) times daily.     simvastatin (ZOCOR) 20 MG tablet Take 20 mg by mouth daily.     traMADol (ULTRAM) 50 MG tablet Take by mouth.     Acetaminophen-Codeine 300-30 MG tablet Take 1 tablet by mouth every 4 (four) hours as needed. (Patient not taking: Reported on 03/01/2022)     botulinum toxin Type A (BOTOX) 200 units injection Inject into the muscle. (Patient not taking: Reported on 03/01/2022)  topiramate (TOPAMAX) 100 MG tablet Take 100 mg by mouth 2 (two) times daily.     No current facility-administered medications for this visit.     Musculoskeletal: Strength & Muscle Tone:  UTA Gait & Station: normal Patient leans: N/A  Psychiatric Specialty Exam: Review of Systems  Neurological:  Positive for headaches (Chronic).  All other systems reviewed and are negative.   There were no vitals taken for this visit.There is no height or weight on file to calculate BMI.  General Appearance: Casual  Eye Contact:  Fair  Speech:  Clear and Coherent  Volume:  Normal  Mood:  Euthymic  Affect:  Congruent  Thought Process:  Goal Directed and Descriptions of Associations: Intact  Orientation:  Full (Time, Place, and Person)  Thought Content: Logical   Suicidal Thoughts:  No  Homicidal Thoughts:  No  Memory:  Immediate;   Fair Recent;   Fair Remote;   Fair  Judgement:  Fair  Insight:  Fair  Psychomotor Activity:  Normal  Concentration:  Concentration: Fair and Attention Span: Fair  Recall:  [employer] of Knowledge: Fair  Language: Fair  Akathisia:  No  Handed:  Right  AIMS (if  indicated): not done  Assets:  Communication Skills Desire for Improvement Housing Transportation  ADL's:  Intact  Cognition: WNL  Sleep:  Fair   Screenings: GAD-7    [place] Office Visit from 02/10/2022 in [place] Video Visit from 07/09/2021 in [place] Video Visit from 05/20/2021 in [place] Video Visit from 04/08/2021 in [facility] [place] Office Visit from 02/23/2021 in [facility]  Total GAD-7 Score 0 0 0 2 7      PHQ2-9    Flowsheet Row Video Visit from 06/23/2022 in [place] Office Visit from 02/10/2022 in [facility] Video Visit from 07/09/2021 in [place] Video Visit from 05/20/2021 in [facility] Video Visit from 04/08/2021 in [place]  PHQ-2 Total Score 0 1 0 0 0      Flowsheet Row Video Visit from 06/23/2022 in [place] from 02/10/2022 in [facility] Counselor from 03/16/2021 in [place] No Risk No Risk No Risk        Assessment and Plan: Tyashia Morrisette is a 62 year old Caucasian female, single, lives in [place], has a history of depression, binge eating disorder, chronic headache was evaluated by telemedicine today.  Patient is currently stable with regards to her mood and sleep although she continues to have low-grade headache which needs management.  Plan as noted below.  Plan MDD in remission BuSpar 15 mg p.o. 4 times daily. Wellbutrin XL 300 mg p.o. daily  GAD-stable BuSpar 15 mg p.o. 4 times daily. Continue CBT with Ms. Christina Hussami  Binge eating disorder in remission Will monitor closely Continue CBT as needed.  Patient to  follow-up with headache specialist for management of headache.  Follow-up in clinic in 4 to 5 months or sooner in person.   Consent: Patient/Guardian gives verbal consent for treatment and assignment of benefits for services provided during this visit. Patient/Guardian expressed understanding and agreed to proceed.   This note was generated in part or whole with voice recognition software. Voice recognition is usually quite accurate but there are transcription errors that can and very often do occur. I apologize for any typographical errors  that were not detected and corrected.      Ursula Alert, MD 06/24/2022, 8:29 AM

## 2022-06-26 DIAGNOSIS — G43919 Migraine, unspecified, intractable, without status migrainosus: Secondary | ICD-10-CM | POA: Insufficient documentation

## 2022-06-28 ENCOUNTER — Ambulatory Visit: Payer: BC Managed Care – PPO

## 2022-07-27 ENCOUNTER — Telehealth: Payer: Self-pay

## 2022-07-27 DIAGNOSIS — Z634 Disappearance and death of family member: Secondary | ICD-10-CM

## 2022-07-27 DIAGNOSIS — F411 Generalized anxiety disorder: Secondary | ICD-10-CM

## 2022-07-27 DIAGNOSIS — F3342 Major depressive disorder, recurrent, in full remission: Secondary | ICD-10-CM

## 2022-07-27 DIAGNOSIS — F5081 Binge eating disorder: Secondary | ICD-10-CM

## 2022-07-27 MED ORDER — DIAZEPAM 2 MG PO TABS: 1.0000 mg | ORAL_TABLET | Freq: Every day | ORAL | 0 refills | Status: DC | PRN

## 2022-07-27 NOTE — Telephone Encounter (Signed)
Attempted to contact patient-provided grief counseling.  She tried Valium in the past and tolerated it well.  Will give 5 days supply of Valium patient to use it only as needed and limit use.  Provided medication education.Reviewed [place] PMP AWARxE

## 2022-07-27 NOTE — Telephone Encounter (Signed)
pt called upset she states she had to put one of her cats down and she can't stop crying. can you call in something for her to take to calm down.

## 2022-08-27 ENCOUNTER — Other Ambulatory Visit: Payer: Self-pay | Admitting: Psychiatry

## 2022-08-27 DIAGNOSIS — F3342 Major depressive disorder, recurrent, in full remission: Secondary | ICD-10-CM

## 2022-09-06 ENCOUNTER — Other Ambulatory Visit: Payer: Self-pay | Admitting: Psychiatry

## 2022-09-06 DIAGNOSIS — F411 Generalized anxiety disorder: Secondary | ICD-10-CM

## 2022-09-13 DIAGNOSIS — R112 Nausea with vomiting, unspecified: Secondary | ICD-10-CM | POA: Insufficient documentation

## 2022-10-05 ENCOUNTER — Ambulatory Visit: Payer: BC Managed Care – PPO | Admitting: Dermatology

## 2022-10-05 DIAGNOSIS — Z1283 Encounter for screening for malignant neoplasm of skin: Secondary | ICD-10-CM | POA: Diagnosis not present

## 2022-10-05 DIAGNOSIS — Z85828 Personal history of other malignant neoplasm of skin: Secondary | ICD-10-CM

## 2022-10-05 DIAGNOSIS — Z7189 Other specified counseling: Secondary | ICD-10-CM

## 2022-10-05 DIAGNOSIS — L821 Other seborrheic keratosis: Secondary | ICD-10-CM

## 2022-10-05 DIAGNOSIS — Z8589 Personal history of malignant neoplasm of other organs and systems: Secondary | ICD-10-CM

## 2022-10-05 DIAGNOSIS — Z86018 Personal history of other benign neoplasm: Secondary | ICD-10-CM

## 2022-10-05 DIAGNOSIS — L509 Urticaria, unspecified: Secondary | ICD-10-CM

## 2022-10-05 DIAGNOSIS — Z79899 Other long term (current) drug therapy: Secondary | ICD-10-CM

## 2022-10-05 DIAGNOSIS — D229 Melanocytic nevi, unspecified: Secondary | ICD-10-CM

## 2022-10-05 DIAGNOSIS — L814 Other melanin hyperpigmentation: Secondary | ICD-10-CM

## 2022-10-05 DIAGNOSIS — L503 Dermatographic urticaria: Secondary | ICD-10-CM

## 2022-10-05 DIAGNOSIS — L578 Other skin changes due to chronic exposure to nonionizing radiation: Secondary | ICD-10-CM

## 2022-10-05 NOTE — Patient Instructions (Addendum)
Start over the counter Allegra take 1 tablet a day, after 1 week if no better take increase to 2 tablets a day, if no better after 1 week increase to 3 tablets a day, if no better may increase up to 4 tablets day       Due to recent changes in healthcare laws, you may see results of your pathology and/or laboratory studies on MyChart before the doctors have had a chance to review them. We understand that in some cases there may be results that are confusing or concerning to you. Please understand that not all results are received at the same time and often the doctors may need to interpret multiple results in order to provide you with the best plan of care or course of treatment. Therefore, we ask that you please give [place] 2 business days to thoroughly review all your results before contacting the office for clarification. Should we see a critical lab result, you will be contacted sooner.   If You Need Anything After Your Visit  If you have any questions or concerns for your doctor, please call our main line at [phone] and press option 4 to reach your doctor's medical assistant. If no one answers, please leave a voicemail as directed and we will return your call as soon as possible. Messages left after 4 pm will be answered the following business day.   You may also send [place] a message via MyChart. We typically respond to MyChart messages within 1-2 business days.  For prescription refills, please ask your pharmacy to contact our office. Our fax number is [phone].  If you have an urgent issue when the clinic is closed that cannot wait until the next business day, you can page your doctor at the number below.    Please note that while we do our best to be available for urgent issues outside of office hours, we are not available 24/7.   If you have an urgent issue and are unable to reach [place], you may choose to seek medical care at your doctor's office, retail clinic, urgent care center, or  emergency room.  If you have a medical emergency, please immediately call 911 or go to the emergency department.  Pager Numbers  - Dr. Gwen Pounds: [phone]  - Dr. Neale Burly: [phone]  - Dr. Roseanne Reno: [phone]  In the event of inclement weather, please call our main line at [phone] for an update on the status of any delays or closures.  Dermatology Medication Tips: Please keep the boxes that topical medications come in in order to help keep track of the instructions about where and how to use these. Pharmacies typically print the medication instructions only on the boxes and not directly on the medication tubes.   If your medication is too expensive, please contact our office at [phone] option 4 or send [place] a message through MyChart.   We are unable to tell what your co-pay for medications will be in advance as this is different depending on your insurance coverage. However, we may be able to find a substitute medication at lower cost or fill out paperwork to get insurance to cover a needed medication.   If a prior authorization is required to get your medication covered by your insurance company, please allow [place] 1-2 business days to complete this process.  Drug prices often vary depending on where the prescription is filled and some pharmacies may offer cheaper prices.  The website www.goodrx.com contains coupons for medications through different  pharmacies. The prices here do not account for what the cost may be with help from insurance (it may be cheaper with your insurance), but the website can give you the price if you did not use any insurance.  - You can print the associated coupon and take it with your prescription to the pharmacy.  - You may also stop by our office during regular business hours and pick up a GoodRx coupon card.  - If you need your prescription sent electronically to a different pharmacy, notify our office through [facility] or by phone at  [phone] option 4.     Si Usted Necesita Algo Despus de Su Visita  Tambin puede enviarnos un mensaje a travs de Clinical cytogeneticist. Por lo general respondemos a los mensajes de MyChart en el transcurso de 1 a 2 das hbiles.  Para renovar recetas, por favor pida a su farmacia que se ponga en contacto con nuestra oficina. Annie Sable de fax es [place] [phone].  Si tiene un asunto urgente cuando la clnica est cerrada y que no puede esperar hasta el siguiente da hbil, puede llamar/localizar a su doctor(a) al nmero que aparece a continuacin.   Por favor, tenga en cuenta que aunque hacemos todo lo posible para estar disponibles para asuntos urgentes fuera del horario de [place], no estamos disponibles las 24 horas del da, los 7 [address]  [address] de la [place].   Si tiene un problema urgente y no puede comunicarse con nosotros, puede optar por buscar atencin mdica  en el consultorio de su doctor(a), en una clnica privada, en un centro de atencin urgente o en una sala de emergencias.  Si tiene Engineer, drilling, por favor llame inmediatamente al 911 o vaya a la sala de emergencias.  Nmeros de bper  - Dr. Gwen Pounds: [phone]  - Dra. Moye: [phone]  - Dra. Roseanne Reno: [phone]  En caso de inclemencias del [place], por favor llame a Lacy Duverney principal al [phone] para una actualizacin sobre el [place] de cualquier retraso o cierre.  Consejos para la medicacin en dermatologa: Por favor, guarde las cajas en las que vienen los medicamentos de uso tpico para ayudarle a seguir las instrucciones sobre dnde y cmo usarlos. Las farmacias generalmente imprimen las instrucciones del medicamento slo en las cajas y no directamente en los tubos del [place].   Si su medicamento es muy caro, por favor, pngase en contacto con Rolm [place]ala llamando al [phone] y presione la opcin 4 o envenos un mensaje a travs de Clinical cytogeneticist.   No podemos decirle cul ser su copago por los  medicamentos por adelantado ya que esto es diferente dependiendo de la cobertura de su seguro. Sin embargo, es posible que podamos encontrar un medicamento sustituto a Audiological scientist un formulario para que el seguro cubra el medicamento que se considera necesario.   Si se requiere una autorizacin previa para que su compaa de seguros [place] su medicamento, por favor permtanos de 1 a 2 das hbiles para completar [address]  Los precios de los medicamentos varan con frecuencia dependiendo del Environmental consultant de dnde se surte la receta y alguna farmacias pueden ofrecer precios ms baratos.  El sitio web www.goodrx.com tiene cupones para medicamentos de Health and safety inspector. Los precios aqu no tienen en cuenta lo que podra costar con la ayuda del seguro (puede ser ms barato con su seguro), pero el sitio web puede darle el precio si no utiliz Tourist information centre manager.  - Puede imprimir el cupn correspondiente y llevarlo con su  receta a la farmacia.  - Tambin puede pasar por nuestra oficina durante el horario de atencin regular y Charity fundraiser una tarjeta de cupones de GoodRx.  - Si necesita que su receta se enve electrnicamente a una farmacia diferente, informe a nuestra oficina a travs de MyChart de [place] o por telfono llamando al [phone] y presione la opcin 4.

## 2022-10-05 NOTE — Progress Notes (Signed)
Follow-Up Visit   Subjective  Barbara Russo is a 62 y.o. female who presents for the following: Skin Cancer Screening and Full Body Skin Exam, hx of SCC, hx of Dysplastic nevus  The patient presents for Total-Body Skin Exam (TBSE) for skin cancer screening and mole check. The patient has spots, moles and lesions to be evaluated, some may be new or changing and the patient has concerns that these could be cancer.  The following portions of the chart were reviewed this encounter and updated as appropriate: medications, allergies, medical history  Review of Systems:  No other skin or systemic complaints except as noted in HPI or Assessment and Plan.  Objective  Well appearing patient in no apparent distress; mood and affect are within normal limits.  A full examination was performed including scalp, head, eyes, ears, nose, lips, neck, chest, axillae, abdomen, back, buttocks, bilateral upper extremities, bilateral lower extremities, hands, feet, fingers, toes, fingernails, and toenails. All findings within normal limits unless otherwise noted below.   Relevant physical exam findings are noted in the Assessment and Plan.   Assessment & Plan   LENTIGINES, SEBORRHEIC KERATOSES, HEMANGIOMAS - Benign normal skin lesions - Benign-appearing - Call for any changes  MELANOCYTIC NEVI - Tan-brown and/or pink-flesh-colored symmetric macules and papules - Benign appearing on exam today - Observation - Call clinic for new or changing moles - Recommend daily use of broad spectrum spf 30+ sunscreen to sun-exposed areas.   ACTINIC DAMAGE - Chronic condition, secondary to cumulative UV/sun exposure - diffuse scaly erythematous macules with underlying dyspigmentation - Recommend daily broad spectrum sunscreen SPF 30+ to sun-exposed areas, reapply every 2 hours as needed.  - Staying in the shade or wearing long sleeves, sun glasses (UVA+UVB protection) and wide brim hats (4-inch brim around the  entire circumference of the hat) are also recommended for sun protection.  - Call for new or changing lesions.  DERMATOGRAPHISM / Urticaria Back Start otc Allegra take 1 tablet a day, after 1 week if no better take increase to 2 tablets a day, if no better after 1 week increase to 3 tablets a day, if no better may increase up to 4 tablets day  Urticaria or hives is a pink to red patchy whelp- like rash of the skin that typically itches and it is the result of histamine release in the skin.   Hives may have multiple causes including stress, medications, infections, and systemic illness.  Sometimes there is a family history of chronic urticaria.   "Physical urticarias" may be caused by heat, sun, cold, vibration.   Insect bites can cause "papular urticaria". It is often difficult to find the cause of generalized hives.  Statistically, 70% of the time a cause of generalized hives is not found.  Sometimes hives can spontaneously resolve. Other times hives can persist and when it does, and no cause is found, and it has been at least 6 weeks since started, it is called "chronic idiopathic urticaria".  HISTORY OF DYSPLASTIC NEVUS No evidence of recurrence today Recommend regular full body skin exams Recommend daily broad spectrum sunscreen SPF 30+ to sun-exposed areas, reapply every 2 hours as needed.  Call if any new or changing lesions are noted between office visits   HISTORY OF SQUAMOUS CELL CARCINOMA OF THE SKIN - No evidence of recurrence today - No lymphadenopathy - Recommend regular full body skin exams - Recommend daily broad spectrum sunscreen SPF 30+ to sun-exposed areas, reapply every 2 hours as needed.  -  Call if any new or changing lesions are noted between office visits   SKIN CANCER SCREENING PERFORMED TODAY. Skin cancer screening  Actinic skin damage  Melanocytic nevus, unspecified location  History of squamous cell carcinoma  History of dysplastic  nevus  Urticaria  Dermatographism  Medication management  Counseling and coordination of care   Return in about 1 year (around 10/05/2023) for TBSE, hx of SCC,hx of Dysplastic nevus .  IAngelique Holm, CMA, am acting as scribe for Armida Sans, MD .   Documentation: I have reviewed the above documentation for accuracy and completeness, and I agree with the above.  Armida Sans, MD

## 2022-10-13 ENCOUNTER — Encounter: Payer: Self-pay | Admitting: Dermatology

## 2022-10-17 ENCOUNTER — Telehealth: Payer: Self-pay

## 2022-10-17 MED ORDER — VALACYCLOVIR HCL 500 MG PO TABS: ORAL_TABLET | ORAL | 3 refills | Status: DC

## 2022-10-17 NOTE — Telephone Encounter (Signed)
Called patient discussed we will call in a rx for Valtrex to start 1 day before her up coming BBL treatment   Valtrex 500 mg 1 tablet po bid x 7 days, start 1 day before procedure erx'd to [facility]

## 2022-10-19 ENCOUNTER — Ambulatory Visit (INDEPENDENT_AMBULATORY_CARE_PROVIDER_SITE_OTHER): Payer: Self-pay | Admitting: Dermatology

## 2022-10-19 ENCOUNTER — Encounter: Payer: Self-pay | Admitting: Dermatology

## 2022-10-19 VITALS — BP 108/74 | HR 78

## 2022-10-19 DIAGNOSIS — W908XXA Exposure to other nonionizing radiation, initial encounter: Secondary | ICD-10-CM

## 2022-10-19 DIAGNOSIS — X32XXXA Exposure to sunlight, initial encounter: Secondary | ICD-10-CM

## 2022-10-19 DIAGNOSIS — L578 Other skin changes due to chronic exposure to nonionizing radiation: Secondary | ICD-10-CM

## 2022-10-19 DIAGNOSIS — L814 Other melanin hyperpigmentation: Secondary | ICD-10-CM

## 2022-10-19 DIAGNOSIS — L821 Other seborrheic keratosis: Secondary | ICD-10-CM

## 2022-10-19 NOTE — Patient Instructions (Signed)
Due to recent changes in healthcare laws, you may see results of your pathology and/or laboratory studies on MyChart before the doctors have had a chance to review them. We understand that in some cases there may be results that are confusing or concerning to you. Please understand that not all results are received at the same time and often the doctors may need to interpret multiple results in order to provide you with the best plan of care or course of treatment. Therefore, we ask that you please give us 2 business days to thoroughly review all your results before contacting the office for clarification. Should we see a critical lab result, you will be contacted sooner.   If You Need Anything After Your Visit  If you have any questions or concerns for your doctor, please call our main line at [phone] and press option 4 to reach your doctor's medical assistant. If no one answers, please leave a voicemail as directed and we will return your call as soon as possible. Messages left after 4 pm will be answered the following business day.   You may also send us a message via MyChart. We typically respond to MyChart messages within 1-2 business days.  For prescription refills, please ask your pharmacy to contact our office. Our fax number is [phone].  If you have an urgent issue when the clinic is closed that cannot wait until the next business day, you can page your doctor at the number below.    Please note that while we do our best to be available for urgent issues outside of office hours, we are not available 24/7.   If you have an urgent issue and are unable to reach us, you may choose to seek medical care at your doctor's office, retail clinic, urgent care center, or emergency room.  If you have a medical emergency, please immediately call 911 or go to the emergency department.  Pager Numbers  - Dr. Kowalski: [phone]  - Dr. Moye: [phone]  - Dr. Stewart:  [phone]  In the event of inclement weather, please call our main line at [phone] for an update on the status of any delays or closures.  Dermatology Medication Tips: Please keep the boxes that topical medications come in in order to help keep track of the instructions about where and how to use these. Pharmacies typically print the medication instructions only on the boxes and not directly on the medication tubes.   If your medication is too expensive, please contact our office at [phone] option 4 or send us a message through MyChart.   We are unable to tell what your co-pay for medications will be in advance as this is different depending on your insurance coverage. However, we may be able to find a substitute medication at lower cost or fill out paperwork to get insurance to cover a needed medication.   If a prior authorization is required to get your medication covered by your insurance company, please allow us 1-2 business days to complete this process.  Drug prices often vary depending on where the prescription is filled and some pharmacies may offer cheaper prices.  The website www.goodrx.com contains coupons for medications through different pharmacies. The prices here do not account for what the cost may be with help from insurance (it may be cheaper with your insurance), but the website can give you the price if you did not use any insurance.  - You can print the associated coupon and take it with   your prescription to the pharmacy.  - You may also stop by our office during regular business hours and pick up a GoodRx coupon card.  - If you need your prescription sent electronically to a different pharmacy, notify our office through [facility] MyChart or by phone at [phone] option 4.     Si Usted Necesita Algo Despus de Su Visita  Tambin puede enviarnos un mensaje a travs de MyChart. Por lo general respondemos a los mensajes de MyChart en el transcurso de 1 a 2  das hbiles.  Para renovar recetas, por favor pida a su farmacia que se ponga en contacto con nuestra oficina. Nuestro nmero de fax es el [phone].  Si tiene un asunto urgente cuando la clnica est cerrada y que no puede esperar hasta el siguiente da hbil, puede llamar/localizar a su doctor(a) al nmero que aparece a continuacin.   Por favor, tenga en cuenta que aunque hacemos todo lo posible para estar disponibles para asuntos urgentes fuera del horario de oficina, no estamos disponibles las 24 horas del da, los 7 das de la semana.   Si tiene un problema urgente y no puede comunicarse con nosotros, puede optar por buscar atencin mdica  en el consultorio de su doctor(a), en una clnica privada, en un centro de atencin urgente o en una sala de emergencias.  Si tiene una emergencia mdica, por favor llame inmediatamente al 911 o vaya a la sala de emergencias.  Nmeros de bper  - Dr. Kowalski: [phone]  - Dra. Moye: [phone]  - Dra. Stewart: [phone]  En caso de inclemencias del tiempo, por favor llame a nuestra lnea principal al [phone] para una actualizacin sobre el estado de cualquier retraso o cierre.  Consejos para la medicacin en dermatologa: Por favor, guarde las cajas en las que vienen los medicamentos de uso tpico para ayudarle a seguir las instrucciones sobre dnde y cmo usarlos. Las farmacias generalmente imprimen las instrucciones del medicamento slo en las cajas y no directamente en los tubos del medicamento.   Si su medicamento es muy caro, por favor, pngase en contacto con nuestra oficina llamando al [phone] y presione la opcin 4 o envenos un mensaje a travs de MyChart.   No podemos decirle cul ser su copago por los medicamentos por adelantado ya que esto es diferente dependiendo de la cobertura de su seguro. Sin embargo, es posible que podamos encontrar un medicamento sustituto a menor costo o llenar un formulario para que el  seguro cubra el medicamento que se considera necesario.   Si se requiere una autorizacin previa para que su compaa de seguros cubra su medicamento, por favor permtanos de 1 a 2 das hbiles para completar este proceso.  Los precios de los medicamentos varan con frecuencia dependiendo del lugar de dnde se surte la receta y alguna farmacias pueden ofrecer precios ms baratos.  El sitio web www.goodrx.com tiene cupones para medicamentos de diferentes farmacias. Los precios aqu no tienen en cuenta lo que podra costar con la ayuda del seguro (puede ser ms barato con su seguro), pero el sitio web puede darle el precio si no utiliz ningn seguro.  - Puede imprimir el cupn correspondiente y llevarlo con su receta a la farmacia.  - Tambin puede pasar por nuestra oficina durante el horario de atencin regular y recoger una tarjeta de cupones de GoodRx.  - Si necesita que su receta se enve electrnicamente a una farmacia diferente, informe a nuestra oficina a travs de MyChart de [place]   o por telfono llamando al [phone] y presione la opcin 4.  

## 2022-10-19 NOTE — Progress Notes (Signed)
Follow-Up Visit   Subjective  Barbara Russo is a 62 y.o. female who presents for the following: BBL treatment of brown spots at face   The following portions of the chart were reviewed this encounter and updated as appropriate: medications, allergies, medical history  Review of Systems:  No other skin or systemic complaints except as noted in HPI or Assessment and Plan.  Objective  Well appearing patient in no apparent distress; mood and affect are within normal limits.  A focused examination was performed of the following areas: face  Relevant exam findings are noted in the Assessment and Plan.   Assessment & Plan   1. Lentigines Head - Anterior (Face)                     / actinic damage   Soothing moisturizing starter kit given along with cooling gel mask    Photorejuvenation - Head - Anterior (Face)  Sciton BBL - 10/19/22 1700      Patient Details   Current Skin Care: f    Skin Type: II    Anesthestic Cream Applied: Yes    Photo Takes: Yes    Consent Signed: Yes      Treatment Details   Date: 10/19/22    Treatment #: 2    Area: f    Filter: 1st Pass;2nd Pass      1st Pass   Location: F    Device: 515 filter    BBL j/cm2: 12    PW Msec Sec: 10    Cooling Temp: 25    Pulses: 172    11mm: this crystal      2nd Pass   Location: F    Device: 515 filter    BBL j/cm2: 12    PW Msec Sec: 10    Cooling Temp: 25    Pulses: 222    7mm: this crystal used      Prior to the procedure, the patient's past medical history, medications, allergies, and the rare but potential risks and complications were reviewed with the patient and a signed consent was obtained.  Pre and post treatment care was discussed and instructions provided.  Patient tolerated the procedure well.   Wynelle Link avoidance was stressed. The patient will call with any problems, questions or concerns prior to their next appointment.      SEBORRHEIC KERATOSIS -  Stuck-on, waxy, tan-brown papules and/or plaques  - Benign-appearing - Discussed benign etiology and prognosis. - Observe - Call for any changes  LENTIGINES Exam: scattered tan macules Due to sun exposure Treatment Plan: Benign-appearing, observe. Recommend daily broad spectrum sunscreen SPF 30+ to sun-exposed areas, reapply every 2 hours as needed.  Call for any changes    ACTINIC DAMAGE - chronic, secondary to cumulative UV radiation exposure/sun exposure over time - diffuse scaly erythematous macules with underlying dyspigmentation - Recommend daily broad spectrum sunscreen SPF 30+ to sun-exposed areas, reapply every 2 hours as needed.  - Recommend staying in the shade or wearing long sleeves, sun glasses (UVA+UVB protection) and wide brim hats (4-inch brim around the entire circumference of the hat). - Call for new or changing lesions.  Lentigines Head - Anterior (Face)  / actinic damage   Soothing moisturizing starter kit given along with cooling gel mask    Photorejuvenation - Head - Anterior (Face)  Sciton BBL - 10/19/22 1700      Patient Details   Current Skin Care: f    Skin Type:  II    Anesthestic Cream Applied: Yes    Photo Takes: Yes    Consent Signed: Yes      Treatment Details   Date: 10/19/22    Treatment #: 2    Area: f    Filter: 1st Pass;2nd Pass      1st Pass   Location: F    Device: 515 filter    BBL j/cm2: 12    PW Msec Sec: 10    Cooling Temp: 25    Pulses: 172    11mm: this crystal      2nd Pass   Location: F    Device: 515 filter    BBL j/cm2: 12    PW Msec Sec: 10    Cooling Temp: 25    Pulses: 222    7mm: this crystal used      Prior to the procedure, the patient's past medical history, medications, allergies, and the rare but potential risks and complications were reviewed with the patient and a signed consent was obtained.  Pre and post treatment care was discussed and instructions provided.  Patient tolerated the  procedure well.   Wynelle Link avoidance was stressed. The patient will call with any problems, questions or concerns prior to their next appointment.      No follow-ups on file.  IAsher Muir, CMA, am acting as scribe for Armida Sans, MD.   Documentation: I have reviewed the above documentation for accuracy and completeness, and I agree with the above.  Armida Sans, MD

## 2022-10-25 ENCOUNTER — Encounter: Payer: Self-pay | Admitting: Dermatology

## 2022-11-15 ENCOUNTER — Encounter: Payer: Self-pay | Admitting: Psychiatry

## 2022-11-15 ENCOUNTER — Telehealth (INDEPENDENT_AMBULATORY_CARE_PROVIDER_SITE_OTHER): Payer: BC Managed Care – PPO | Admitting: Psychiatry

## 2022-11-15 DIAGNOSIS — F3342 Major depressive disorder, recurrent, in full remission: Secondary | ICD-10-CM | POA: Diagnosis not present

## 2022-11-15 DIAGNOSIS — F411 Generalized anxiety disorder: Secondary | ICD-10-CM

## 2022-11-15 DIAGNOSIS — F5081 Binge eating disorder: Secondary | ICD-10-CM

## 2022-11-15 DIAGNOSIS — F50819 Binge eating disorder, unspecified: Secondary | ICD-10-CM

## 2022-11-15 NOTE — Progress Notes (Signed)
Virtual Visit via Video Note  I connected with Barbara Russo on 11/15/22 at 10:00 AM EDT by a video enabled telemedicine application and verified that I am speaking with the correct person using two identifiers.  Location Provider Location : [facility] Patient Location : Home  Participants: Patient , Provider    I discussed the limitations of evaluation and management by telemedicine and the availability of in person appointments. The patient expressed understanding and agreed to proceed.    I discussed the assessment and treatment plan with the patient. The patient was provided an opportunity to ask questions and all were answered. The patient agreed with the plan and demonstrated an understanding of the instructions.   The patient was advised to call back or seek an in-person evaluation if the symptoms worsen or if the condition fails to improve as anticipated.    [facility] MD OP Progress Note  11/15/2022 10:16 AM China Gaut  MRN:  [id number]  Chief Complaint:  Chief Complaint  Patient presents with   Follow-up   Anxiety   Depression   Medication Refill   HPI: Barbara Russo is a 62 year old Caucasian female, single, employed, lives in [place], has a history of MDD, binge eating disorder, headaches, hyperlipidemia was evaluated by telemedicine today.  Patient today appeared to be alert, oriented to person place time and situation.  Patient appeared to be pleasant and cooperative in session.  Reports she is currently doing fairly well.  She continues to be compliant on her medications as prescribed.  Currently tolerating the BuSpar and Wellbutrin.  Denies side effects.  She reports work is going well.  Patient denies any suicidality, homicidality or perceptual disturbances.  Her headaches are currently stable.  She was able to come off of the Depakote.  She reports she is currently on topiramate and that helps.  Patient denies any binge eating episodes.  Reports she  is aware she needs to watch her diet and be more active.  She is currently working on that.  Patient reports she enjoys her pets, has a dog and couple of cats.  That has been therapeutic for her.  Patient denies any other concerns today.  Visit Diagnosis:    ICD-10-CM   1. MDD (major depressive disorder), recurrent, in full remission (HCC)  F33.42     2. Binge eating disorder  F50.81     3. GAD (generalized anxiety disorder)  F41.1       Past Psychiatric History: I have reviewed past psychiatric history from progress note on 01/30/2018.  Past trials of BuSpar, Valium, Wellbutrin, Ritalin, Depakote, Vyvanse.  Past Medical History:  Past Medical History:  Diagnosis Date   Actinic keratosis    Anxiety    Depression    Dysplastic nevus 10/03/2019   L lower back paraspinal mod to severe (shave removal)   Dysplastic nevus 09/23/2020   R lat breast - moderate   Dysplastic nevus 09/29/2021   Left Upper Back 5.5cm lat to spine, medial to mid scapula, moderate atypia   Squamous cell carcinoma of skin 09/26/2019   SCCIS - L prox lat pretibial     Past Surgical History:  Procedure Laterality Date   BREAST BIOPSY Left 2008   neg    Family Psychiatric History: Reviewed family psychiatric history from progress note on 01/30/2018.  Family History:  Family History  Adopted: Yes    Social History: Reviewed social history from progress note on 01/30/2018. Social History   Socioeconomic History   Marital status: Single  Spouse name: Not on file   Number of children: 0   Years of education: Not on file   Highest education level: Bachelor's degree (e.g., BA, AB, BS)  Occupational History   Not on file  Tobacco Use   Smoking status: Never   Smokeless tobacco: Never  Vaping Use   Vaping Use: Never used  Substance and Sexual Activity   Alcohol use: Yes    Alcohol/week: 2.0 standard drinks of alcohol    Types: 2 Cans of beer per week   Drug use: Never   Sexual activity: Not  Currently  Other Topics Concern   Not on file  Social History Narrative   Not on file   Social Determinants of Health   Financial Resource Strain: Low Risk  (01/30/2018)   Overall Financial Resource Strain (CARDIA)    Difficulty of Paying Living Expenses: Not hard at all  Food Insecurity: No Food Insecurity (01/30/2018)   Hunger Vital Sign    Worried About Running Out of Food in the Last Year: Never true    Ran Out of Food in the Last Year: Never true  Transportation Needs: No Transportation Needs (01/30/2018)   PRAPARE - Administrator, Civil Service (Medical): No    Lack of Transportation (Non-Medical): No  Physical Activity: Inactive (01/30/2018)   Exercise Vital Sign    Days of Exercise per Week: 0 days    Minutes of Exercise per Session: 0 min  Stress: No Stress Concern Present (01/30/2018)   [employer] of Occupational Health - Occupational Stress Questionnaire    Feeling of Stress : Not at all  Social Connections: Moderately Isolated (01/30/2018)   Social Connection and Isolation Panel [NHANES]    Frequency of Communication with Friends and Family: More than three times a week    Frequency of Social Gatherings with Friends and Family: Never    Attends Religious Services: Never    Database administrator or Organizations: No    Attends Engineer, structural: Never    Marital Status: Never married    Allergies: No Known Allergies  Metabolic Disorder Labs: No results found for: "HGBA1C", "MPG" No results found for: "PROLACTIN" No results found for: "CHOL", "TRIG", "HDL", "CHOLHDL", "VLDL", "LDLCALC" No results found for: "TSH"  Therapeutic Level Labs: No results found for: "LITHIUM" No results found for: "VALPROATE" No results found for: "CBMZ"  Current Medications: Current Outpatient Medications  Medication Sig Dispense Refill   buPROPion (WELLBUTRIN XL) 300 MG 24 hr tablet TAKE 1 TABLET(300 MG) BY MOUTH DAILY 90 tablet 1   busPIRone  (BUSPAR) 15 MG tablet Take 4 tablets (60 mg total) by mouth as directed. Take 1 tablet daily in the morning , 1 tablet daily noon, 1 tablet daily evening and 1 tablet daily at bedtime 360 tablet 1   butalbital-acetaminophen-caffeine (FIORICET) 50-325-40 MG tablet Take by mouth.     Calcium Carbonate-Vitamin D 600-200 MG-UNIT TABS Take 600 mg by mouth.     cetirizine (ZYRTEC) 10 MG tablet Take by mouth 2 (two) times daily.     diazepam (VALIUM) 2 MG tablet Take 0.5-1 tablets (1-2 mg total) by mouth daily as needed for anxiety. Grief reaction 5 tablet 0   gabapentin (NEURONTIN) 400 MG capsule Take 400 mg by mouth 3 (three) times daily.     simvastatin (ZOCOR) 20 MG tablet Take 20 mg by mouth daily.     topiramate (TOPAMAX) 100 MG tablet Take 100 mg by mouth 2 (two) times  daily.     traMADol (ULTRAM) 50 MG tablet Take by mouth.     valACYclovir (VALTREX) 500 MG tablet Take 1 tablet po bid x 7 days, start 1 day prior to procedure 30 tablet 3   Acetaminophen-Codeine 300-30 MG tablet Take 1 tablet by mouth every 4 (four) hours as needed. (Patient not taking: Reported on 03/01/2022)     botulinum toxin Type A (BOTOX) 200 units injection Inject into the muscle. (Patient not taking: Reported on 03/01/2022)     No current facility-administered medications for this visit.     Musculoskeletal: Strength & Muscle Tone:  UTA Gait & Station:  Seated Patient leans: N/A  Psychiatric Specialty Exam: Review of Systems  Psychiatric/Behavioral: Negative.      There were no vitals taken for this visit.There is no height or weight on file to calculate BMI.  General Appearance: Fairly Groomed  Eye Contact:  Fair  Speech:  Clear and Coherent  Volume:  Normal  Mood:  Euthymic  Affect:  Congruent  Thought Process:  Goal Directed and Descriptions of Associations: Intact  Orientation:  Full (Time, Place, and Person)  Thought Content: Logical   Suicidal Thoughts:  No  Homicidal Thoughts:  No  Memory:   Immediate;   Fair Recent;   Fair Remote;   Fair  Judgement:  Fair  Insight:  Fair  Psychomotor Activity:  Normal  Concentration:  Concentration: Fair and Attention Span: Fair  Recall:  [employer] of Knowledge: Fair  Language: Fair  Akathisia:  No  Handed:  Right  AIMS (if indicated): not done  Assets:  Communication Skills Desire for Improvement Housing Social Support  ADL's:  Intact  Cognition: WNL  Sleep:  Fair   Screenings: GAD-7    Garment/textile technologist Visit from 02/10/2022 in [facility] Video Visit from 07/09/2021 in [facility] Video Visit from 05/20/2021 in [facility] Video Visit from 04/08/2021 in [facility] Office Visit from 02/23/2021 in [facility]  Total GAD-7 Score 0 0 0 2 7      PHQ2-9    Flowsheet Row Video Visit from 06/23/2022 in [facility] Office Visit from 02/10/2022 in [facility] Video Visit from 07/09/2021 in [facility] Video Visit from 05/20/2021 in [facility] Video Visit from 04/08/2021 in [facility]  PHQ-2 Total Score 0 1 0 0 0      Flowsheet Row Video Visit from 11/15/2022 in [facility] Video Visit from 06/23/2022 in [facility] Office Visit from 02/10/2022 in [facility]  C-SSRS RISK CATEGORY No Risk No Risk No Risk        Assessment and Plan: Barbara Russo is a 62 year old Caucasian female, single, lives in [place], has a history of depression, binge eating disorder, chronic headache was evaluated by telemedicine today.   Patient is currently stable.  Plan as noted below.  Plan MDD in remission BuSpar 15 mg p.o. 4 times daily. Wellbutrin XL 300 mg p.o. daily  GAD-stable BuSpar 15 mg p.o. 4 times daily Continue CBT with Ms. Christina Hussami as needed  Binge eating disorder in remission Will monitor closely Continue CBT as needed.  Follow-up in clinic in 5 months or sooner in person.  Collaboration of Care: Collaboration of Care: Referral or follow-up with counselor/therapist AEB patient to continue CBT.  Patient/Guardian was advised Release of Information must be obtained prior to any record release in order to collaborate their care with an outside provider. Patient/Guardian was advised if they have not already done so to contact the registration department to sign all necessary forms in order for [place] to release information regarding their care.   Consent: Patient/Guardian gives verbal consent for treatment and assignment of benefits for services provided during this visit. Patient/Guardian expressed understanding and agreed to proceed.   This note was generated in part or whole with voice recognition software. Voice recognition is usually quite accurate but there are transcription errors that can and very often do occur. I apologize for any typographical errors that were not detected and corrected.    Jomarie Longs, MD 11/15/2022, 10:16 AM

## 2022-11-24 ENCOUNTER — Encounter: Payer: Self-pay | Admitting: Radiology

## 2022-11-24 ENCOUNTER — Ambulatory Visit
Admission: RE | Admit: 2022-11-24 | Discharge: 2022-11-24 | Disposition: A | Discharge status: Home / Self Care | Payer: BC Managed Care – PPO | Source: Ambulatory Visit | Attending: Physician Assistant | Admitting: Physician Assistant

## 2022-11-24 DIAGNOSIS — Z1231 Encounter for screening mammogram for malignant neoplasm of breast: Secondary | ICD-10-CM | POA: Diagnosis present

## 2022-12-28 ENCOUNTER — Ambulatory Visit: Payer: BC Managed Care – PPO | Admitting: Dermatology

## 2023-01-09 ENCOUNTER — Encounter: Payer: Self-pay | Admitting: Dermatology

## 2023-01-09 ENCOUNTER — Ambulatory Visit (INDEPENDENT_AMBULATORY_CARE_PROVIDER_SITE_OTHER): Payer: Self-pay | Admitting: Dermatology

## 2023-01-09 VITALS — BP 113/75

## 2023-01-09 DIAGNOSIS — L988 Other specified disorders of the skin and subcutaneous tissue: Secondary | ICD-10-CM

## 2023-01-09 NOTE — Patient Instructions (Signed)

## 2023-01-09 NOTE — Progress Notes (Signed)
   Follow-Up Visit   Subjective  Ellisyn Teubert is a 62 y.o. female who presents for the following: filler for facial elastosis  The following portions of the chart were reviewed this encounter and updated as appropriate: medications, allergies, medical history  Review of Systems:  No other skin or systemic complaints except as noted in HPI or Assessment and Plan.  Objective  Well appearing patient in no apparent distress; mood and affect are within normal limits.  A focused examination was performed of the face. Relevant physical exam findings are noted in the Assessment and Plan or shown in photos.  Before photos                 After filler photos                  Injection map photo    Assessment & Plan   Elastosis of skin   Facial Elastosis  Prior to the procedure, the patient's past medical history, allergies and the rare but potential risks and complications were reviewed with the patient and a signed consent was obtained. Pre and post-treatment care was discussed and instructions provided.   Location: lips upper and lower lip vermillion, corners of the mouth ; anterior chin horizontal crease  Filler TypeGus Height 1cc lot [id number] exp 07/09/23  Procedure: Lidocaine-tetracaine ointment was applied to treatment areas to achieve good local anesthesia. The area was prepped thoroughly with Puracyn. After introducing the needle into the desired treatment area, the syringe plunger was drawn back to ensure there was no flash of blood prior to injecting the filler in order to minimize risk of intravascular injection and vascular occlusion. After injection of the filler, the treated areas were cleansed and iced to reduce swelling. Post-treatment instructions were reviewed with the patient.       Patient tolerated the procedure well. The patient will call with any problems, questions or concerns prior to their next  appointment.   Return for as scheduled.  I, Ardis Rowan, RMA, am acting as scribe for Armida Sans, MD .   Documentation: I have reviewed the above documentation for accuracy and completeness, and I agree with the above.  Armida Sans, MD

## 2023-01-17 ENCOUNTER — Other Ambulatory Visit: Payer: Self-pay | Admitting: Gastroenterology

## 2023-01-17 ENCOUNTER — Ambulatory Visit
Admission: RE | Admit: 2023-01-17 | Discharge: 2023-01-17 | Disposition: A | Discharge status: Home / Self Care | Payer: BC Managed Care – PPO | Source: Ambulatory Visit | Attending: Gastroenterology | Admitting: Gastroenterology

## 2023-01-17 DIAGNOSIS — R7401 Elevation of levels of liver transaminase levels: Secondary | ICD-10-CM | POA: Diagnosis present

## 2023-01-17 DIAGNOSIS — R160 Hepatomegaly, not elsewhere classified: Secondary | ICD-10-CM | POA: Insufficient documentation

## 2023-02-27 ENCOUNTER — Other Ambulatory Visit: Payer: Self-pay | Admitting: Psychiatry

## 2023-02-27 DIAGNOSIS — F3342 Major depressive disorder, recurrent, in full remission: Secondary | ICD-10-CM

## 2023-03-09 DIAGNOSIS — M62838 Other muscle spasm: Secondary | ICD-10-CM | POA: Insufficient documentation

## 2023-03-09 DIAGNOSIS — M47812 Spondylosis without myelopathy or radiculopathy, cervical region: Secondary | ICD-10-CM | POA: Insufficient documentation

## 2023-03-10 ENCOUNTER — Other Ambulatory Visit: Payer: Self-pay | Admitting: Psychiatry

## 2023-03-10 DIAGNOSIS — F411 Generalized anxiety disorder: Secondary | ICD-10-CM

## 2023-03-21 ENCOUNTER — Encounter: Payer: Self-pay | Admitting: Family Medicine

## 2023-03-21 ENCOUNTER — Ambulatory Visit: Payer: BC Managed Care – PPO | Admitting: Family Medicine

## 2023-03-21 VITALS — BP 124/74 | HR 77 | Temp 98.0°F | Resp 16 | Ht 70.5 in | Wt 186.5 lb

## 2023-03-21 DIAGNOSIS — G43719 Chronic migraine without aura, intractable, without status migrainosus: Secondary | ICD-10-CM | POA: Diagnosis not present

## 2023-03-21 DIAGNOSIS — F39 Unspecified mood [affective] disorder: Secondary | ICD-10-CM | POA: Diagnosis not present

## 2023-03-21 DIAGNOSIS — J309 Allergic rhinitis, unspecified: Secondary | ICD-10-CM

## 2023-03-21 DIAGNOSIS — Z23 Encounter for immunization: Secondary | ICD-10-CM

## 2023-03-21 DIAGNOSIS — R21 Rash and other nonspecific skin eruption: Secondary | ICD-10-CM | POA: Diagnosis not present

## 2023-03-21 DIAGNOSIS — E785 Hyperlipidemia, unspecified: Secondary | ICD-10-CM

## 2023-03-21 NOTE — Patient Instructions (Addendum)
It was a pleasure meeting you today. Thank you for allowing me to take part in your health care.  Our goals for today as we discussed include:  Will review chart  Follow up in 3 months Will get blood work at that time Please schedule early appointment for fasting labs  Flu vaccine today  If you have any questions or concerns, please do not hesitate to call the office at [phone].  I look forward to our next visit and until then take care and stay safe.  Regards,   Dana Allan, MD   [facility]

## 2023-03-26 ENCOUNTER — Encounter: Payer: Self-pay | Admitting: Family Medicine

## 2023-03-26 DIAGNOSIS — Z23 Encounter for immunization: Secondary | ICD-10-CM | POA: Insufficient documentation

## 2023-03-26 DIAGNOSIS — F39 Unspecified mood [affective] disorder: Secondary | ICD-10-CM | POA: Insufficient documentation

## 2023-03-26 DIAGNOSIS — R21 Rash and other nonspecific skin eruption: Secondary | ICD-10-CM | POA: Insufficient documentation

## 2023-03-26 DIAGNOSIS — E785 Hyperlipidemia, unspecified: Secondary | ICD-10-CM | POA: Insufficient documentation

## 2023-03-26 NOTE — Assessment & Plan Note (Signed)
Chronic migraines managed with Topamax 100mg  twice daily, Neurontin 400mg  twice daily, and Botox injections every three months. Recent improvement noted with reintroduction of Botox. Fioricet and Reyvow used as needed for acute episodes. -Continue current medication regimen and Botox injections. -Consider reducing preventative medications (Topamax, Neurontin) if migraines remain well-controlled on Botox. -Follow up with Neurology

## 2023-03-26 NOTE — Assessment & Plan Note (Signed)
Chronic symptoms managed with Zyrtec 40mg  daily and Flonase as needed. -Continue current regimen.

## 2023-03-26 NOTE — Assessment & Plan Note (Signed)
Recurrent rash under breasts, likely intertrigo. Managed with Nystatin cream and powder, and keeping the area dry. -Continue Nystatin cream and powder as needed. -Return for evaluation if rash worsens or does not improve.

## 2023-03-26 NOTE — Assessment & Plan Note (Signed)
Managed with Simvastatin. -Continue Simvastatin.

## 2023-03-26 NOTE — Assessment & Plan Note (Signed)
Managed with Wellbutrin 300mg  daily and Buspar 15mg  three times daily. Stable on current regimen for years under the care of Dr. Elna Breslow. -Continue current medication regimen.

## 2023-03-26 NOTE — Progress Notes (Signed)
SUBJECTIVE:   Chief Complaint  Patient presents with   Establish Care   HPI Patient presents to establish care  Discussed the use of AI scribe software for clinical note transcription with the patient, who gave verbal consent to proceed.  History of Present Illness The patient, a 62 year old individual, presents to establish care after moving from [place] five years ago. She reports a history of depression, managed with Wellbutrin and Buspar, prescribed by a psychiatrist. The patient also has a history of chronic migraines, managed by a neurologist with a combination of Topamax, Neurontin, Fioricet, and Botox injections every three months. She recently started Reyvow for emergency migraine relief. The patient expressed a desire to reduce the dosage of Topamax and Neurontin due to side effects since starting Botox treatment.  The patient also reports a persistent, itchy rash, which has been a source of significant distress for the past year. The rash is particularly severe under the breasts, where it has been diagnosed as intertrigo. The patient has been managing this with an expired Nystatin cream and Gold Bond powder, which has provided some relief. She also reports taking four Zyrtec tablets daily for severe itchiness, despite allergy testing revealing no specific allergies.  The patient has been taking Simvastatin for cholesterol management, prescribed by her previous primary care provider. She also takes Pepcid, prescribed for the rash, and Flonase for nasal symptoms. The patient reports taking calcium and vitamin D supplements, and Tramadol for occasional back and neck pain, managed by a physiatrist.  The patient has a history of dense breasts, as noted in a recent mammogram. She also reports a history of abnormal Pap smears in her twenties. The patient's last colonoscopy was in 2021, with a follow-up recommended in five years. She is due for a follow-up with a gastroenterologist for  monitoring of a fatty liver.  The patient denies any history of blood pressure issues, thyroid issues, diabetes, smoking, or illicit drug use. She reports social alcohol consumption. 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 She reports no chest pain or shortness of breath.    PERTINENT PMH / PSH: As above  OBJECTIVE:  BP 124/74   Pulse 77   Temp 98 F (36.7 C)   Resp 16   Ht 5' 10.5" (1.791 m)   Wt 186 lb 8 oz (84.6 kg)   SpO2 99%   BMI 26.38 kg/m    Physical Exam Vitals reviewed.  Constitutional:      General: She is not in acute distress.    Appearance: She is not ill-appearing.  HENT:     Head: Normocephalic.     Right Ear: Tympanic membrane, ear canal and external ear normal.     Left Ear: Tympanic membrane, ear canal and external ear normal.     Nose: Nose normal.     Mouth/Throat:     Mouth: Mucous membranes are moist.  Eyes:     Extraocular Movements: Extraocular movements intact.     Conjunctiva/sclera: Conjunctivae normal.     Pupils: Pupils are equal, round, and reactive to light.  Neck:     Thyroid: No thyromegaly or thyroid tenderness.     Vascular: No carotid bruit.  Cardiovascular:     Rate and Rhythm: Normal rate and regular rhythm.     Pulses: Normal pulses.     Heart sounds: Normal heart sounds.  Pulmonary:     Effort: Pulmonary effort is normal.     Breath sounds: Normal breath sounds.  Abdominal:     General: Bowel sounds are normal. There is no distension.     Palpations: Abdomen is soft.     Tenderness: There is no abdominal tenderness. There is no right CVA tenderness, left CVA tenderness, guarding or rebound.  Musculoskeletal:        General: Normal range of motion.     Cervical back: Normal range of motion.     Right lower leg: No edema.     Left lower leg: No edema.  Lymphadenopathy:     Cervical: No cervical adenopathy.  Skin:    Capillary Refill: Capillary refill takes less than 2 seconds.  Neurological:     General:  No focal deficit present.     Mental Status: She is alert and oriented to person, place, and time. Mental status is at baseline.     Motor: No weakness.  Psychiatric:        Mood and Affect: Mood normal.        Behavior: Behavior normal.        Thought Content: Thought content normal.        Judgment: Judgment normal.        03/21/2023   10:18 AM 06/23/2022    1:39 PM 02/10/2022   12:08 PM 07/09/2021    8:39 AM 05/20/2021    9:07 AM  Depression screen PHQ 2/9  Decreased Interest 1      Down, Depressed, Hopeless 1      PHQ - 2 Score 2      Altered sleeping 1      Tired, decreased energy 2      Change in appetite 1      Feeling bad or failure about yourself  1      Trouble concentrating 0      Moving slowly or fidgety/restless 0      Suicidal thoughts 0      PHQ-9 Score 7      Difficult doing work/chores Not difficult at all         Information is confidential and restricted. Go to Review Flowsheets to unlock data.      03/21/2023   10:18 AM 02/10/2022   12:08 PM 07/09/2021    8:39 AM 05/20/2021    9:07 AM  GAD 7 : Generalized Anxiety Score  Nervous, Anxious, on Edge 0     Control/stop worrying 0     Worry too much - different things 1     Trouble relaxing 0     Restless 0     Easily annoyed or irritable 1     Afraid - awful might happen 0     Total GAD 7 Score 2     Anxiety Difficulty Not difficult at all        Information is confidential and restricted. Go to Review Flowsheets to unlock data.    ASSESSMENT/PLAN:  Need for influenza vaccination -     Flu vaccine trivalent PF, 6mos and older(Flulaval,Afluria,Fluarix,Fluzone)  Mood disorder (HCC) Assessment & Plan: Managed with Wellbutrin 300mg  daily and Buspar 15mg  three times daily. Stable on current regimen for years under the care of Dr. Elna Breslow. -Continue current medication regimen.   Intractable chronic migraine without aura and without status migrainosus Assessment & Plan: Chronic migraines managed with  Topamax 100mg  twice daily, Neurontin 400mg  twice daily, and Botox injections every three months. Recent improvement noted with reintroduction of Botox. Fioricet and Reyvow used as needed for acute episodes. -Continue current medication regimen  and Botox injections. -Consider reducing preventative medications (Topamax, Neurontin) if migraines remain well-controlled on Botox. -Follow up with Neurology   Rash Assessment & Plan: Recurrent rash under breasts, likely intertrigo. Managed with Nystatin cream and powder, and keeping the area dry. -Continue Nystatin cream and powder as needed. -Return for evaluation if rash worsens or does not improve.   Hyperlipidemia, unspecified hyperlipidemia type Assessment & Plan: Managed with Simvastatin. -Continue Simvastatin.   Allergic rhinitis, unspecified seasonality, unspecified trigger Assessment & Plan: Chronic symptoms managed with Zyrtec 40mg  daily and Flonase as needed. -Continue current regimen.    General Health Maintenance -Flu vaccine to be administered today. -Check status of last colonoscopy and mammogram. -Consider Pneumovax 20 at next visit. -Check fasting labs at next visit if recent results are not available. -Consider Pap smear in approximately six months.    PDMP reviewed  Return in about 3 months (around 06/21/2023) for PCP.  Dana Allan, MD

## 2023-03-29 ENCOUNTER — Ambulatory Visit: Payer: BC Managed Care – PPO | Admitting: Dermatology

## 2023-04-20 ENCOUNTER — Ambulatory Visit: Payer: BC Managed Care – PPO | Admitting: Psychiatry

## 2023-04-20 ENCOUNTER — Encounter: Payer: Self-pay | Admitting: Psychiatry

## 2023-04-20 VITALS — BP 121/82 | HR 83 | Temp 97.5°F | Ht 70.5 in | Wt 187.6 lb

## 2023-04-20 DIAGNOSIS — F50811 Binge eating disorder, moderate: Secondary | ICD-10-CM

## 2023-04-20 DIAGNOSIS — F3342 Major depressive disorder, recurrent, in full remission: Secondary | ICD-10-CM

## 2023-04-20 DIAGNOSIS — F411 Generalized anxiety disorder: Secondary | ICD-10-CM

## 2023-04-20 NOTE — Progress Notes (Signed)
BH MD OP Progress Note  04/20/2023 11:17 AM Barbara Russo  MRN:  [id number]  Chief Complaint:  Chief Complaint  Patient presents with   Follow-up   Depression   Anxiety   Medication Refill   HPI: Barbara Russo is a 62 year old Caucasian female, single, employed, lives in [place], has a history of MDD, binge eating disorder, headaches, hyperlipidemia was evaluated in office today.  Patient today reports she is currently doing well with regards to her mood symptoms.  Does not feel depressed.  Does have anxiety about the fact that she does not have a lot of work life balance.  She reports she has been programmed all her life to work hard and because of that she is always preoccupied with her work.  She does a great job with her work however does not take a lot of time for herself and feels anxious when she has to separate herself from her work.  Patient reports if she has to go on vacation or to a party she has to work extra hard the previous day to make sure everything is okay.  She reports other than the holidays she never takes a day off to herself.  She believes work is a good escape for her and she is constantly worried that if she stops working she might even lose her job.  She does not want to lose her job at the age of 21.  Patient agrees to start working with a therapist to start talking about some of her fears.  Patient denies any binge eating episodes.  She is currently compliant on the BuSpar and the Wellbutrin.  Denies side effects.  Patient denies any suicidality, homicidality or perceptual disturbances.  Patient denies any other concerns today.  Visit Diagnosis:    ICD-10-CM   1. MDD (major depressive disorder), recurrent, in full remission (HCC)  F33.42     2. Moderate binge-eating disorder  F50.811     3. GAD (generalized anxiety disorder)  F41.1       Past Psychiatric History: I have reviewed past psychiatric history from progress note on 01/30/2018.  Past  trials of BuSpar, Valium, Wellbutrin, Ritalin, Depakote, Vyvanse.  Past Medical History:  Past Medical History:  Diagnosis Date   Actinic keratosis    Anxiety    Depression    Dysplastic nevus 10/03/2019   L lower back paraspinal mod to severe (shave removal)   Dysplastic nevus 09/23/2020   R lat breast - moderate   Dysplastic nevus 09/29/2021   Left Upper Back 5.5cm lat to spine, medial to mid scapula, moderate atypia   Squamous cell carcinoma of skin 09/26/2019   SCCIS - L prox lat pretibial     Past Surgical History:  Procedure Laterality Date   BREAST BIOPSY Left 2008   neg    Family Psychiatric History: I have reviewed family psychiatric history from progress note on 01/30/2018.  Family History:  Family History  Adopted: Yes    Social History: I have reviewed social history from progress note on 01/30/2018. Social History   Socioeconomic History   Marital status: Single    Spouse name: Not on file   Number of children: 0   Years of education: Not on file   Highest education level: Bachelor's degree (e.g., BA, AB, BS)  Occupational History   Not on file  Tobacco Use   Smoking status: Never   Smokeless tobacco: Never  Vaping Use   Vaping status: Never Used  Substance and Sexual  Activity   Alcohol use: Yes    Alcohol/week: 2.0 standard drinks of alcohol    Types: 2 Cans of beer per week   Drug use: Never   Sexual activity: Not Currently  Other Topics Concern   Not on file  Social History Narrative   Not on file   Social Determinants of Health   Financial Resource Strain: Patient Declined (12/07/2022)   Received from [facility]   Overall Financial Resource Strain (CARDIA)    Difficulty of Paying Living Expenses: Patient declined  Food Insecurity: Patient Declined (12/07/2022)   Received from [facility]   Hunger Vital Sign    Worried About Running Out of Food in the Last Year: Patient declined    Ran Out of Food in  the Last Year: Patient declined  Transportation Needs: Patient Declined (12/07/2022)   Received from [facility] - Transportation    In the past 12 months, has lack of transportation kept you from medical appointments or from getting medications?: Patient declined    Lack of Transportation (Non-Medical): Patient declined  Physical Activity: Inactive (01/30/2018)   Exercise Vital Sign    Days of Exercise per Week: 0 days    Minutes of Exercise per Session: 0 min  Stress: No Stress Concern Present (01/30/2018)   [employer] of Occupational Health - Occupational Stress Questionnaire    Feeling of Stress : Not at all  Social Connections: Moderately Isolated (01/30/2018)   Social Connection and Isolation Panel [NHANES]    Frequency of Communication with Friends and Family: More than three times a week    Frequency of Social Gatherings with Friends and Family: Never    Attends Religious Services: Never    Diplomatic Services operational officer: No    Attends Engineer, structural: Never    Marital Status: Never married    Allergies: No Known Allergies  Metabolic Disorder Labs: No results found for: "HGBA1C", "MPG" No results found for: "PROLACTIN" No results found for: "CHOL", "TRIG", "HDL", "CHOLHDL", "VLDL", "LDLCALC" No results found for: "TSH"  Therapeutic Level Labs: No results found for: "LITHIUM" No results found for: "VALPROATE" No results found for: "CBMZ"  Current Medications: Current Outpatient Medications  Medication Sig Dispense Refill   botulinum toxin Type A (BOTOX) 200 units injection Inject into the muscle.     buPROPion (WELLBUTRIN XL) 300 MG 24 hr tablet TAKE 1 TABLET(300 MG) BY MOUTH DAILY 90 tablet 1   busPIRone (BUSPAR) 15 MG tablet TAKE 1 TABLET BY MOUTH EVERY MORNING, AT NOON, EVERY EVENING AND EVERY NIGHT AT BEDTIME 360 tablet 1   butalbital-acetaminophen-caffeine (FIORICET) 50-325-40 MG tablet Take by mouth.      Calcium Carbonate-Vitamin D 600-200 MG-UNIT TABS Take 600 mg by mouth.     cetirizine (ZYRTEC) 10 MG tablet Take by mouth 2 (two) times daily.     famotidine (PEPCID) 40 MG tablet Take 1 tablet by mouth 2 (two) times daily.     fluticasone (FLONASE) 50 MCG/ACT nasal spray Place 2 sprays into both nostrils daily.     gabapentin (NEURONTIN) 400 MG capsule Take 400 mg by mouth 2 (two) times daily.     hydrocortisone 2.5 % cream Apply 1 Application topically 2 (two) times daily.     mometasone (ELOCON) 0.1 % cream Apply 1 Application topically daily.     REYVOW 100 MG TABS Take 1 tablet by mouth as needed.     simvastatin (ZOCOR)  20 MG tablet Take 20 mg by mouth daily.     traMADol (ULTRAM) 50 MG tablet Take by mouth.     topiramate (TOPAMAX) 100 MG tablet Take 100 mg by mouth 2 (two) times daily.     No current facility-administered medications for this visit.     Musculoskeletal: Strength & Muscle Tone: within normal limits Gait & Station: normal Patient leans: N/A  Psychiatric Specialty Exam: Review of Systems  Psychiatric/Behavioral:  The patient is nervous/anxious.     Blood pressure 121/82, pulse 83, temperature (!) 97.5 F (36.4 C), temperature source Skin, height 5' 10.5" (1.791 m), weight 187 lb 9.6 oz (85.1 kg).Body mass index is 26.54 kg/m.  General Appearance: Fairly Groomed  Eye Contact:  Fair  Speech:  Clear and Coherent  Volume:  Normal  Mood:  Anxious  Affect:  Appropriate  Thought Process:  Goal Directed and Descriptions of Associations: Intact  Orientation:  Full (Time, Place, and Person)  Thought Content: Logical   Suicidal Thoughts:  No  Homicidal Thoughts:  No  Memory:  Immediate;   Fair Recent;   Fair Remote;   Fair  Judgement:  Fair  Insight:  Fair  Psychomotor Activity:  Normal  Concentration:  Concentration: Fair and Attention Span: Fair  Recall:  [employer] of Knowledge: Fair  Language: Fair  Akathisia:  No  Handed:  Right  AIMS (if indicated):  done  Assets:  Desire for Improvement Housing Social Support  ADL's:  Intact  Cognition: WNL  Sleep:  Fair   Screenings: Midwife Visit from 04/20/2023 in [facility]  AIMS Total Score 0      GAD-7    Flowsheet Row Office Visit from 04/20/2023 in [facility] Office Visit from 03/21/2023 in [facility] Visit from 02/10/2022 in [facility] Video Visit from 07/09/2021 in [facility] Video Visit from 05/20/2021 in [facility]  Total GAD-7 Score 2 2 0 0 0      PHQ2-9    Flowsheet Row Office Visit from 04/20/2023 in [facility] Office Visit from 03/21/2023 in [facility] Video Visit from 06/23/2022 in [facility] Office Visit from 02/10/2022 in [facility] Video Visit from 07/09/2021 in [facility]  PHQ-2 Total Score 0 2 0 1 0  PHQ-9 Total Score -- 7 -- -- --      Flowsheet Row Office Visit from 04/20/2023 in [facility] Video Visit from 11/15/2022 in [facility] Video Visit from 06/23/2022 in [facility]  C-SSRS RISK CATEGORY No Risk No Risk No Risk        Assessment and Plan: Barbara Russo is a 61 year old Caucasian female, single, lives in [place], has a history of depression, binge eating disorder, chronic headache was evaluated in office today.  Patient is currently stable on medication with regards to her depression and binge eating however does have anxiety as noted above and would like to work  on finding a work life balance for herself and hence is agreeable to start CBT.  Plan as noted below.  Plan MDD in remission BuSpar 15 mg 4 times daily Wellbutrin XL 300 mg p.o. daily  GAD-unstable BuSpar 15 mg p.o. 4 times daily Patient's fears are mostly regarding her inability to find extracurricular activities for herself and being preoccupied with her work.  Patient will benefit from CBT-provided resources in the community.  Binge eating disorder in remission Continue CBT as needed     Collaboration of Care: Collaboration of Care: Referral or follow-up with counselor/therapist AEB patient encouraged to establish care with therapist.  Patient/Guardian was advised Release of Information must be obtained prior to any record release in order to collaborate their care with an outside provider. Patient/Guardian was advised if they have not already done so to contact the registration department to sign all necessary forms in order for [place] to release information regarding their care.   Consent: Patient/Guardian gives verbal consent for treatment and assignment of benefits for services provided during this visit. Patient/Guardian expressed understanding and agreed to proceed.   Follow-up in clinic in 3 months or sooner if needed.  This note was generated in part or whole with voice recognition software. Voice recognition is usually quite accurate but there are transcription errors that can and very often do occur. I apologize for any typographical errors that were not detected and corrected.    Jomarie Longs, MD 04/21/2023, 8:28 AM

## 2023-04-20 NOTE — Patient Instructions (Signed)
  [facility] [address] [place], [place], [place] [address]

## 2023-05-08 ENCOUNTER — Ambulatory Visit: Payer: BC Managed Care – PPO | Admitting: Family Medicine

## 2023-05-10 DIAGNOSIS — M25473 Effusion, unspecified ankle: Secondary | ICD-10-CM | POA: Insufficient documentation

## 2023-05-10 DIAGNOSIS — M79671 Pain in right foot: Secondary | ICD-10-CM | POA: Insufficient documentation

## 2023-05-10 DIAGNOSIS — M7989 Other specified soft tissue disorders: Secondary | ICD-10-CM | POA: Insufficient documentation

## 2023-05-10 DIAGNOSIS — M25571 Pain in right ankle and joints of right foot: Secondary | ICD-10-CM | POA: Insufficient documentation

## 2023-06-09 ENCOUNTER — Telehealth: Payer: Self-pay | Admitting: Family Medicine

## 2023-06-09 NOTE — Telephone Encounter (Signed)
Patient need lab orders.

## 2023-06-11 ENCOUNTER — Other Ambulatory Visit: Payer: Self-pay | Admitting: Family Medicine

## 2023-06-11 DIAGNOSIS — R7309 Other abnormal glucose: Secondary | ICD-10-CM

## 2023-06-11 DIAGNOSIS — F39 Unspecified mood [affective] disorder: Secondary | ICD-10-CM

## 2023-06-11 DIAGNOSIS — F3341 Major depressive disorder, recurrent, in partial remission: Secondary | ICD-10-CM

## 2023-06-11 DIAGNOSIS — E538 Deficiency of other specified B group vitamins: Secondary | ICD-10-CM

## 2023-06-11 DIAGNOSIS — E785 Hyperlipidemia, unspecified: Secondary | ICD-10-CM

## 2023-06-11 DIAGNOSIS — E559 Vitamin D deficiency, unspecified: Secondary | ICD-10-CM

## 2023-06-16 ENCOUNTER — Other Ambulatory Visit: Payer: Self-pay | Admitting: Family Medicine

## 2023-06-16 ENCOUNTER — Other Ambulatory Visit (INDEPENDENT_AMBULATORY_CARE_PROVIDER_SITE_OTHER): Payer: BC Managed Care – PPO

## 2023-06-16 DIAGNOSIS — E785 Hyperlipidemia, unspecified: Secondary | ICD-10-CM

## 2023-06-16 DIAGNOSIS — E538 Deficiency of other specified B group vitamins: Secondary | ICD-10-CM | POA: Diagnosis not present

## 2023-06-16 DIAGNOSIS — E559 Vitamin D deficiency, unspecified: Secondary | ICD-10-CM

## 2023-06-16 DIAGNOSIS — F39 Unspecified mood [affective] disorder: Secondary | ICD-10-CM

## 2023-06-16 DIAGNOSIS — R7309 Other abnormal glucose: Secondary | ICD-10-CM | POA: Diagnosis not present

## 2023-06-16 LAB — COMPREHENSIVE METABOLIC PANEL
ALT: 25 U/L (ref 0–35)
AST: 21 U/L (ref 0–37)
Albumin: 4.1 g/dL (ref 3.5–5.2)
Alkaline Phosphatase: 87 U/L (ref 39–117)
BUN: 16 mg/dL (ref 6–23)
CO2: 23 meq/L (ref 19–32)
Calcium: 9.1 mg/dL (ref 8.4–10.5)
Chloride: 110 meq/L (ref 96–112)
Creatinine, Ser: 0.91 mg/dL (ref 0.40–1.20)
GFR: 67.56 mL/min (ref >60.00)
Glucose, Bld: 102 mg/dL — ABNORMAL HIGH (ref 70–99)
Potassium: 4.2 meq/L (ref 3.5–5.1)
Sodium: 142 meq/L (ref 135–145)
Total Bilirubin: 0.4 mg/dL (ref 0.2–1.2)
Total Protein: 6.6 g/dL (ref 6.0–8.3)

## 2023-06-16 LAB — CBC WITH DIFFERENTIAL/PLATELET
Basophils Absolute: 0.1 10*3/uL (ref 0.0–0.1)
Basophils Relative: 0.9 % (ref 0.0–3.0)
Eosinophils Absolute: 0.2 10*3/uL (ref 0.0–0.7)
Eosinophils Relative: 3.3 % (ref 0.0–5.0)
HCT: 41.2 % (ref 36.0–46.0)
Hemoglobin: 14.1 g/dL (ref 12.0–15.0)
Lymphocytes Relative: 31.7 % (ref 12.0–46.0)
Lymphs Abs: 2.2 10*3/uL (ref 0.7–4.0)
MCHC: 34.2 g/dL (ref 30.0–36.0)
MCV: 97.2 fL (ref 78.0–100.0)
Monocytes Absolute: 0.4 10*3/uL (ref 0.1–1.0)
Monocytes Relative: 5.7 % (ref 3.0–12.0)
Neutro Abs: 4 10*3/uL (ref 1.4–7.7)
Neutrophils Relative %: 58.4 % (ref 43.0–77.0)
Platelets: 212 10*3/uL (ref 150.0–400.0)
RBC: 4.24 Mil/uL (ref 3.87–5.11)
RDW: 12.5 % (ref 11.5–15.5)
WBC: 6.9 10*3/uL (ref 4.0–10.5)

## 2023-06-16 LAB — LIPID PANEL
Cholesterol: 213 mg/dL — ABNORMAL HIGH (ref 0–200)
HDL: 81.9 mg/dL (ref >39.00)
LDL Cholesterol: 111 mg/dL — ABNORMAL HIGH (ref 0–99)
NonHDL: 131.5
Total CHOL/HDL Ratio: 3
Triglycerides: 101 mg/dL (ref 0.0–149.0)
VLDL: 20.2 mg/dL (ref 0.0–40.0)

## 2023-06-16 LAB — VITAMIN B12: Vitamin B-12: 468 pg/mL (ref 211–911)

## 2023-06-16 LAB — HEMOGLOBIN A1C: Hgb A1c MFr Bld: 5.6 % (ref 4.6–6.5)

## 2023-06-16 LAB — VITAMIN D 25 HYDROXY (VIT D DEFICIENCY, FRACTURES): VITD: 25.8 ng/mL — ABNORMAL LOW (ref 30.00–100.00)

## 2023-06-16 MED ORDER — VITAMIN D (ERGOCALCIFEROL) 1.25 MG (50000 UNIT) PO CAPS
50000.0000 [IU] | ORAL_CAPSULE | ORAL | 1 refills | Status: AC
Start: 2023-06-16 — End: ?

## 2023-06-20 ENCOUNTER — Ambulatory Visit: Payer: BC Managed Care – PPO | Admitting: Family Medicine

## 2023-06-29 ENCOUNTER — Encounter: Payer: Self-pay | Admitting: Family Medicine

## 2023-06-29 ENCOUNTER — Ambulatory Visit: Payer: BC Managed Care – PPO | Admitting: Family Medicine

## 2023-06-29 VITALS — BP 126/76 | HR 91 | Temp 98.4°F | Resp 18 | Ht 70.5 in | Wt 192.0 lb

## 2023-06-29 DIAGNOSIS — E785 Hyperlipidemia, unspecified: Secondary | ICD-10-CM | POA: Diagnosis not present

## 2023-06-29 DIAGNOSIS — Z1231 Encounter for screening mammogram for malignant neoplasm of breast: Secondary | ICD-10-CM

## 2023-06-29 DIAGNOSIS — E559 Vitamin D deficiency, unspecified: Secondary | ICD-10-CM

## 2023-06-29 DIAGNOSIS — G43919 Migraine, unspecified, intractable, without status migrainosus: Secondary | ICD-10-CM

## 2023-06-29 DIAGNOSIS — M62838 Other muscle spasm: Secondary | ICD-10-CM | POA: Diagnosis not present

## 2023-06-29 DIAGNOSIS — F39 Unspecified mood [affective] disorder: Secondary | ICD-10-CM

## 2023-06-29 DIAGNOSIS — J309 Allergic rhinitis, unspecified: Secondary | ICD-10-CM | POA: Diagnosis not present

## 2023-06-29 MED ORDER — FLUTICASONE PROPIONATE 50 MCG/ACT NA SUSP
2.0000 | Freq: Every day | NASAL | 11 refills | Status: AC
Start: 2023-06-29 — End: ?

## 2023-06-29 MED ORDER — SIMVASTATIN 20 MG PO TABS: 20.0000 mg | ORAL_TABLET | Freq: Every day | ORAL | 3 refills | Status: DC

## 2023-06-29 NOTE — Patient Instructions (Addendum)
It was a pleasure meeting you today. Thank you for allowing me to take part in your health care.  Our goals for today as we discussed include:  For your neck Tylenol 500 mg every 6 hours as needed Lidocaine patch every 12 hours as needed Diclofenac gel every 6 hours as needed Heat/Ice as needed Gentle low back stretching daily Can refer to physical therapy if no improvement   Refills sent for requested medications  Referral sent for Mammogram . Please call to schedule appointment. Due June 2025 [address] [facility] [address] [place], [place] [address] [phone]     Please schedule PAP for this year  This is a list of the screening recommended for you and due dates:  Health Maintenance  Topic Date Due   HIV Screening  Never done   Hepatitis C Screening  Never done   Pap with HPV screening  Never done   Colon Cancer Screening  Never done   Zoster (Shingles) Vaccine (2 of 2) 06/21/2019   COVID-19 Vaccine (5 - 2024-25 season) 02/12/2023   Mammogram  11/23/2024   DTaP/Tdap/Td vaccine (2 - Td or Tdap) 02/16/2028   Flu Shot  Completed   HPV Vaccine  Aged Out      If you have any questions or concerns, please do not hesitate to call the office at [phone].  I look forward to our next visit and until then take care and stay safe.  Regards,   Dana Allan, MD   [facility]

## 2023-06-29 NOTE — Progress Notes (Signed)
SUBJECTIVE:   Chief Complaint  Patient presents with   Abnormal Lab    3 month follow up   HPI Presents for follow up chronic disease management  Discussed the use of AI scribe software for clinical note transcription with the patient, who gave verbal consent to proceed.  History of Present Illness The patient, with a history of hyperlipidemia, presented for a three-month follow-up visit. She reported adherence to prescribed simvastatin; however, recent blood work indicated elevated cholesterol levels. The patient also reported taking vitamin D supplements as previously advised and had stopped taking calcium supplements. She expressed concerns about her vitamin D and calcium intake, which were addressed during the consultation.  The patient also reported a recent foot injury sustained around Thanksgiving, which required her to wear a boot. She noted that walking in the boot had caused significant discomfort and pain, particularly in the neck area. The patient described the neck pain as severe, causing headaches and disrupting sleep. She reported using metaxalone, a muscle relaxant, to manage the pain but found it to be exhausting. The patient also mentioned having a massage to alleviate the discomfort and was considering chiropractic treatment.  In addition to the above, the patient reported taking a variety of medications for different conditions, including Topamax and gabapentin for neurological issues, Wellbutrin for mental health, and Fioricet for headaches. She also mentioned using a nasal spray for rhinitis and Rayvo for occasional severe headaches. The patient expressed a desire to decrease her medication load, particularly the Topamax and gabapentin, under the guidance of her neurologist.  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 She noted a significant improvement in her symptoms since starting the antihistamines.  The patient also mentioned having regular mammograms and colonoscopies, with the next mammogram due in June and the next colonoscopy due in 2026. She also mentioned having a Pap smear done recently and planned to have another one within the year.      PERTINENT PMH / PSH: As above  OBJECTIVE:  BP 126/76   Pulse 91   Temp 98.4 F (36.9 C)   Resp 18   Ht 5' 10.5" (1.791 m)   Wt 192 lb (87.1 kg)   SpO2 99%   BMI 27.16 kg/m    Physical Exam Vitals reviewed.  Constitutional:      General: She is not in acute distress.    Appearance: She is not ill-appearing.  HENT:     Head: Normocephalic.     Right Ear: Tympanic membrane, ear canal and external ear normal.     Left Ear: Tympanic membrane, ear canal and external ear normal.     Nose: Nose normal.     Mouth/Throat:     Mouth: Mucous membranes are moist.  Eyes:     Extraocular Movements: Extraocular movements intact.     Conjunctiva/sclera: Conjunctivae normal.     Pupils: Pupils are equal, round, and reactive to light.  Neck:     Thyroid: No thyromegaly or thyroid tenderness.     Vascular: No carotid bruit.  Cardiovascular:     Rate and Rhythm: Normal rate and regular rhythm.     Pulses: Normal pulses.     Heart sounds: Normal heart sounds.  Pulmonary:     Effort: Pulmonary effort is normal.     Breath sounds: Normal breath sounds.  Abdominal:     General: Bowel sounds are normal. There is no distension.     Palpations: Abdomen  is soft.     Tenderness: There is no abdominal tenderness. There is no right CVA tenderness, left CVA tenderness, guarding or rebound.  Musculoskeletal:        General: Normal range of motion.     Cervical back: Normal range of motion.     Right lower leg: No edema.     Left lower leg: No edema.  Lymphadenopathy:     Cervical: No cervical adenopathy.  Skin:    Capillary Refill: Capillary refill takes less than 2 seconds.  Neurological:     General: No focal deficit present.     Mental  Status: She is alert and oriented to person, place, and time. Mental status is at baseline.     Motor: No weakness.  Psychiatric:        Mood and Affect: Mood normal.        Behavior: Behavior normal.        Thought Content: Thought content normal.        Judgment: Judgment normal.           07/11/2023    4:15 PM 06/29/2023    2:35 PM 04/20/2023   11:27 AM 03/21/2023   10:18 AM 06/23/2022    1:39 PM  Depression screen PHQ 2/9  Decreased Interest 1 1  1    Down, Depressed, Hopeless 1 0  1   PHQ - 2 Score 2 1  2    Altered sleeping 0 0  1   Tired, decreased energy 1 1  2    Change in appetite 1 1  1    Feeling bad or failure about yourself  1 1  1    Trouble concentrating 0 0  0   Moving slowly or fidgety/restless 0 0  0   Suicidal thoughts 0 0  0   PHQ-9 Score 5 4  7    Difficult doing work/chores Not difficult at all Not difficult at all  Not difficult at all      Information is confidential and restricted. Go to Review Flowsheets to unlock data.      07/11/2023    4:16 PM 06/29/2023    2:35 PM 04/20/2023   11:27 AM 03/21/2023   10:18 AM  GAD 7 : Generalized Anxiety Score  Nervous, Anxious, on Edge 0 0  0  Control/stop worrying 0 0  0  Worry too much - different things 1 0  1  Trouble relaxing 0 0  0  Restless 0 0  0  Easily annoyed or irritable 1 1  1   Afraid - awful might happen 0 0  0  Total GAD 7 Score 2 1  2   Anxiety Difficulty Not difficult at all Not difficult at all  Not difficult at all     Information is confidential and restricted. Go to Review Flowsheets to unlock data.    ASSESSMENT/PLAN:  Hyperlipidemia, unspecified hyperlipidemia type Assessment & Plan: Elevated cholesterol despite regular use of Simvastatin. Discussed the need to increase the dose or switch to Crestor to achieve better control. -Increase Simvastatin to 40mg  daily. -Recheck cholesterol levels in 6 months to 1 year.  Orders: -     Simvastatin; Take 1 tablet (20 mg total) by mouth daily.   Dispense: 90 tablet; Refill: 3  Allergic rhinitis, unspecified seasonality, unspecified trigger -     Fluticasone Propionate; Place 2 sprays into both nostrils daily.  Dispense: 1 g; Refill: 11  Breast cancer screening by mammogram -     3D Screening Mammogram,  Left and Right; Future  Vitamin D deficiency Assessment & Plan: Patient is currently on high-dose Vitamin D supplementation. Discussed the importance of not taking additional Vitamin D with calcium supplements. -Continue high-dose Vitamin D supplementation as prescribed. -Once completed, patient can resume over-the-counter Vitamin D and calcium supplements, ensuring no additional Vitamin D is in the calcium supplement.   Muscle spasticity Assessment & Plan: Patient reports significant neck pain, likely due to muscle tension from wearing a boot for a foot injury. Discussed conservative management options including heat, gentle exercises, over-the-counter topical treatments, and physical therapy. -Recommend heat application, gentle neck exercises, and over-the-counter topical treatments (diclofenac gel, lidocaine patches, capsaicin cream, Biofreeze). -Consider physical therapy if no improvement in 1-2 weeks. -Consider short course of steroids if no improvement after conservative measures.   Refractory migraine Assessment & Plan: Managed well on current medication -Continue current medications as prescribed by other providers Topamax, Gabapentin, Fioricet, Botox. -Follow up with Neurology as scheduled   Mood disorder [facility]) Assessment & Plan: Managed with Wellbutrin 300mg  daily and Buspar 15mg  three times daily. Stable on current regimen for years under the care of Dr. Elna Breslow. -Continue current medication regimen.     General Health Maintenance -Colonoscopy due in 2026. Last completed in 2021.  Personal history of adenomatous colon polyps. -Pap smear to be done sometime in 2025. Patient to schedule appointment this year for  completion.    PDMP reviewed  Return if symptoms worsen or fail to improve, for PCP.  Dana Allan, MD

## 2023-07-11 ENCOUNTER — Ambulatory Visit: Payer: BC Managed Care – PPO | Admitting: Nurse Practitioner

## 2023-07-11 ENCOUNTER — Encounter: Payer: Self-pay | Admitting: Nurse Practitioner

## 2023-07-11 VITALS — BP 118/76 | HR 84 | Temp 97.6°F | Ht 70.5 in | Wt 188.4 lb

## 2023-07-11 DIAGNOSIS — N644 Mastodynia: Secondary | ICD-10-CM | POA: Diagnosis not present

## 2023-07-11 DIAGNOSIS — R0781 Pleurodynia: Secondary | ICD-10-CM | POA: Diagnosis not present

## 2023-07-11 NOTE — Patient Instructions (Signed)
ISIT SUMMARY:  Today, we discussed your recent rib pain, which has been troubling you for the past week. We also reviewed your history of musculoskeletal pain following a fall during Thanksgiving. Additionally, we talked about your general health maintenance, including your upcoming mammogram.  YOUR PLAN:  -RIB PAIN: Chest pain can have various causes, but in your case, it is localized under the breasts and exacerbated by movement and deep breathing. We will conduct a chest X-ray on 07/12/2023 to investigate further. In the meantime, applying warm compresses to the area may help alleviate the discomfort.  -MUSCULOSKELETAL PAIN: Musculoskeletal pain refers to pain affecting the muscles, ligaments, and tendons. Your pain is likely related to your previous fall and has persisted despite massage therapy. If the pain continues, we may need to consider further evaluation.  -GENERAL HEALTH MAINTENANCE: Routine health maintenance is important for early detection and prevention of diseases. Your upcoming mammogram in February/March 2025 is part of this routine care to ensure your breast health.  INSTRUCTIONS:  Please follow up with the chest X-ray scheduled for 07/12/2023. If your musculoskeletal pain persists, we may need to consider further evaluation. Don't forget your upcoming mammogram in February/March 2025.

## 2023-07-11 NOTE — Progress Notes (Signed)
Established Patient Office Visit  Subjective:  Patient ID: Barbara Russo, female    DOB: [birth year]  Age: 63 y.o. MRN: [id number]  CC:  Chief Complaint  Patient presents with   Acute Visit    Pinching feeling under left breast x 1 week now pain is around the left breast Pain 3-4 /10  Picking stuff up & breathing increases the pain    Discussed the use of a AI scribe software for clinical note transcription with the patient, who gave verbal consent to proceed.  HPI  Barbara Russo presents with chest and rib pain.  She has been experiencing chest and rib pain for the past week, initially localized under the breasts and now spreading to the rib cage area. The pain is described as a pinching and tightness sensation, exacerbated by movement, talking, and breathing. She rates the pain as 3.5/10, with intensification during certain activities. No radiation to the arms is noted, and there is no association with recent falls. No recent cough or congestion and no history of breast problems. The pain is particularly bothersome when bending over, putting down bowls for her animals, and upon waking up in the morning, where it feels stiff but improves slightly with stretching.  HPI   Past Medical History:  Diagnosis Date   Actinic keratosis    Anxiety    Depression    Dysplastic nevus 10/03/2019   L lower back paraspinal mod to severe (shave removal)   Dysplastic nevus 09/23/2020   R lat breast - moderate   Dysplastic nevus 09/29/2021   Left Upper Back 5.5cm lat to spine, medial to mid scapula, moderate atypia   Squamous cell carcinoma of skin 09/26/2019   SCCIS - L prox lat pretibial     Past Surgical History:  Procedure Laterality Date   BREAST BIOPSY Left 2008   neg    Family History  Adopted: Yes    Social History   Socioeconomic History   Marital status: Single    Spouse name: Not on file   Number of children: 0   Years of education: Not on file   Highest  education level: Bachelor's degree (e.g., BA, AB, BS)  Occupational History   Not on file  Tobacco Use   Smoking status: Never   Smokeless tobacco: Never  Vaping Use   Vaping status: Never Used  Substance and Sexual Activity   Alcohol use: Yes    Alcohol/week: 2.0 standard drinks of alcohol    Types: 2 Cans of beer per week   Drug use: Never   Sexual activity: Not Currently  Other Topics Concern   Not on file  Social History Narrative   Not on file   Social Drivers of Health   Financial Resource Strain: Low Risk  (06/28/2023)   Overall Financial Resource Strain (CARDIA)    Difficulty of Paying Living Expenses: Not hard at all  Food Insecurity: No Food Insecurity (06/28/2023)   Hunger Vital Sign    Worried About Running Out of Food in the Last Year: Never true    Ran Out of Food in the Last Year: Never true  Transportation Needs: No Transportation Needs (06/28/2023)   PRAPARE - Administrator, Civil Service (Medical): No    Lack of Transportation (Non-Medical): No  Physical Activity: Unknown (06/28/2023)   Exercise Vital Sign    Days of Exercise per Week: 0 days    Minutes of Exercise per Session: Not on file  Stress: No Stress Concern  Present (06/28/2023)   [employer] of Occupational Health - Occupational Stress Questionnaire    Feeling of Stress : Not at all  Social Connections: Moderately Isolated (06/28/2023)   Social Connection and Isolation Panel [NHANES]    Frequency of Communication with Friends and Family: Twice a week    Frequency of Social Gatherings with Friends and Family: Once a week    Attends Religious Services: 1 to 4 times per year    Active Member of [employer] or Organizations: No    Attends Engineer, structural: Not on file    Marital Status: Never married  Intimate Partner Violence: Not At Risk (01/30/2018)   Humiliation, Afraid, Rape, and Kick questionnaire    Fear of Current or Ex-Partner: No    Emotionally Abused: No     Physically Abused: No    Sexually Abused: No     Outpatient Medications Prior to Visit  Medication Sig Dispense Refill   botulinum toxin Type A (BOTOX) 200 units injection Inject into the muscle.     buPROPion (WELLBUTRIN XL) 300 MG 24 hr tablet TAKE 1 TABLET(300 MG) BY MOUTH DAILY 90 tablet 1   busPIRone (BUSPAR) 15 MG tablet TAKE 1 TABLET BY MOUTH EVERY MORNING, AT NOON, EVERY EVENING AND EVERY NIGHT AT BEDTIME 360 tablet 1   butalbital-acetaminophen-caffeine (FIORICET) 50-325-40 MG tablet Take by mouth.     cetirizine (ZYRTEC) 10 MG tablet Take by mouth 2 (two) times daily.     famotidine (PEPCID) 40 MG tablet Take 1 tablet by mouth 2 (two) times daily.     fluticasone (FLONASE) 50 MCG/ACT nasal spray Place 2 sprays into both nostrils daily. 1 g 11   gabapentin (NEURONTIN) 400 MG capsule Take 400 mg by mouth 2 (two) times daily.     hydrocortisone 2.5 % cream Apply 1 Application topically 2 (two) times daily.     mometasone (ELOCON) 0.1 % cream Apply 1 Application topically daily.     REYVOW 100 MG TABS Take 1 tablet by mouth as needed.     simvastatin (ZOCOR) 20 MG tablet Take 1 tablet (20 mg total) by mouth daily. 90 tablet 3   traMADol (ULTRAM) 50 MG tablet Take by mouth.     Vitamin D, Ergocalciferol, (DRISDOL) 1.25 MG (50000 UNIT) CAPS capsule Take 1 capsule (50,000 Units total) by mouth every 7 (seven) days. 12 capsule 1   topiramate (TOPAMAX) 100 MG tablet Take 100 mg by mouth 2 (two) times daily.     No facility-administered medications prior to visit.    No Known Allergies  ROS Review of Systems Negative unless indicated in HPI.    Objective:    Physical Exam Constitutional:      Appearance: Normal appearance.  Cardiovascular:     Rate and Rhythm: Normal rate and regular rhythm.     Pulses: Normal pulses.     Heart sounds: Normal heart sounds.  Chest:     Chest wall: Tenderness present.    Musculoskeletal:     Cervical back: Normal range of motion.      Right lower leg: No edema.     Left lower leg: No edema.  Neurological:     General: No focal deficit present.     Mental Status: She is alert. Mental status is at baseline.  Psychiatric:        Mood and Affect: Mood normal.        Behavior: Behavior normal.        Thought Content:  Thought content normal.        Judgment: Judgment normal.     BP 118/76   Pulse 84   Temp 97.6 F (36.4 C)   Ht 5' 10.5" (1.791 m)   Wt 188 lb 6.4 oz (85.5 kg)   SpO2 98%   BMI 26.65 kg/m  Wt Readings from Last 3 Encounters:  07/11/23 188 lb 6.4 oz (85.5 kg)  06/29/23 192 lb (87.1 kg)  03/21/23 186 lb 8 oz (84.6 kg)     Health Maintenance  Topic Date Due   Cervical Cancer Screening (HPV/Pap Cotest)  Never done   Colonoscopy  Never done   Zoster Vaccines- Shingrix (2 of 2) 06/21/2019   COVID-19 Vaccine (5 - 2024-25 season) 07/27/2023 (Originally 02/12/2023)   Hepatitis C Screening  07/11/2024 (Originally 10/31/1978)   HIV Screening  07/11/2024 (Originally 10/31/1975)   MAMMOGRAM  11/23/2024   DTaP/Tdap/Td (2 - Td or Tdap) 02/16/2028   INFLUENZA VACCINE  Completed   HPV VACCINES  Aged Out    There are no preventive care reminders to display for this patient.  No results found for: "TSH" Lab Results  Component Value Date   WBC 6.9 06/16/2023   HGB 14.1 06/16/2023   HCT 41.2 06/16/2023   MCV 97.2 06/16/2023   PLT 212.0 06/16/2023   Lab Results  Component Value Date   NA 142 06/16/2023   K 4.2 06/16/2023   CO2 23 06/16/2023   GLUCOSE 102 (H) 06/16/2023   BUN 16 06/16/2023   CREATININE 0.91 06/16/2023   BILITOT 0.4 06/16/2023   ALKPHOS 87 06/16/2023   AST 21 06/16/2023   ALT 25 06/16/2023   PROT 6.6 06/16/2023   ALBUMIN 4.1 06/16/2023   CALCIUM 9.1 06/16/2023   GFR 67.56 06/16/2023   Lab Results  Component Value Date   CHOL 213 (H) 06/16/2023   Lab Results  Component Value Date   HDL 81.90 06/16/2023   Lab Results  Component Value Date   LDLCALC 111 (H) 06/16/2023    Lab Results  Component Value Date   TRIG 101.0 06/16/2023   Lab Results  Component Value Date   CHOLHDL 3 06/16/2023   Lab Results  Component Value Date   HGBA1C 5.6 06/16/2023      Assessment & Plan:  Rib pain on left side Assessment & Plan: Pain localized to the submammary area, described as pinching and tightness, exacerbated by movement and deep breathing. No radiation of pain. EKG normal. -Order chest X-ray. -Advise warm compresses to the area of discomfort.  Orders: -     EKG 12-Lead -     DG Chest 2 View; Future  Pain of left breast -     EKG 12-Lead    Follow-up: No follow-ups on file.   Kara Dies, NP

## 2023-07-12 ENCOUNTER — Other Ambulatory Visit: Payer: BC Managed Care – PPO

## 2023-07-12 ENCOUNTER — Encounter: Payer: Self-pay | Admitting: Family Medicine

## 2023-07-12 ENCOUNTER — Ambulatory Visit (INDEPENDENT_AMBULATORY_CARE_PROVIDER_SITE_OTHER): Payer: BC Managed Care – PPO

## 2023-07-12 DIAGNOSIS — R0781 Pleurodynia: Secondary | ICD-10-CM

## 2023-07-12 DIAGNOSIS — Z1231 Encounter for screening mammogram for malignant neoplasm of breast: Secondary | ICD-10-CM | POA: Insufficient documentation

## 2023-07-12 NOTE — Assessment & Plan Note (Signed)
Patient is currently on high-dose Vitamin D supplementation. Discussed the importance of not taking additional Vitamin D with calcium supplements. -Continue high-dose Vitamin D supplementation as prescribed. -Once completed, patient can resume over-the-counter Vitamin D and calcium supplements, ensuring no additional Vitamin D is in the calcium supplement.

## 2023-07-12 NOTE — Assessment & Plan Note (Signed)
Managed with Wellbutrin 300mg  daily and Buspar 15mg  three times daily. Stable on current regimen for years under the care of Dr. Elna Breslow. -Continue current medication regimen.

## 2023-07-12 NOTE — Assessment & Plan Note (Signed)
Patient reports significant neck pain, likely due to muscle tension from wearing a boot for a foot injury. Discussed conservative management options including heat, gentle exercises, over-the-counter topical treatments, and physical therapy. -Recommend heat application, gentle neck exercises, and over-the-counter topical treatments (diclofenac gel, lidocaine patches, capsaicin cream, Biofreeze). -Consider physical therapy if no improvement in 1-2 weeks. -Consider short course of steroids if no improvement after conservative measures.

## 2023-07-12 NOTE — Assessment & Plan Note (Signed)
Elevated cholesterol despite regular use of Simvastatin. Discussed the need to increase the dose or switch to Crestor to achieve better control. -Increase Simvastatin to 40mg  daily. -Recheck cholesterol levels in 6 months to 1 year.

## 2023-07-12 NOTE — Assessment & Plan Note (Addendum)
Managed well on current medication -Continue current medications as prescribed by other providers Topamax, Gabapentin, Fioricet, Botox. -Follow up with Neurology as scheduled

## 2023-07-14 DIAGNOSIS — R0781 Pleurodynia: Secondary | ICD-10-CM | POA: Insufficient documentation

## 2023-07-14 DIAGNOSIS — R0789 Other chest pain: Secondary | ICD-10-CM | POA: Insufficient documentation

## 2023-07-14 NOTE — Assessment & Plan Note (Addendum)
Pain localized to the submammary area, described as pinching and tightness, exacerbated by movement and deep breathing. No radiation of pain. EKG normal. -Order chest X-ray. -Advise warm compresses to the area of discomfort.

## 2023-07-18 ENCOUNTER — Telehealth: Payer: Self-pay

## 2023-07-18 NOTE — Telephone Encounter (Signed)
Called Patient to let her know that the chest xray results are not back yet but when the results come in someone will call her.

## 2023-07-18 NOTE — Telephone Encounter (Signed)
Copied from CRM [id number]. Topic: Clinical - Lab/Test Results >> Jul 18, 2023  9:54 AM Deaijah H wrote: Reason for CRM: Patient called in for chest xray results / please call [phone]

## 2023-07-20 ENCOUNTER — Ambulatory Visit: Payer: BC Managed Care – PPO | Admitting: Psychiatry

## 2023-07-20 ENCOUNTER — Encounter: Payer: Self-pay | Admitting: Psychiatry

## 2023-07-20 VITALS — BP 138/85 | HR 102 | Temp 97.2°F | Ht 70.5 in | Wt 191.0 lb

## 2023-07-20 DIAGNOSIS — F50811 Binge eating disorder, moderate: Secondary | ICD-10-CM | POA: Diagnosis not present

## 2023-07-20 DIAGNOSIS — F411 Generalized anxiety disorder: Secondary | ICD-10-CM

## 2023-07-20 DIAGNOSIS — F3342 Major depressive disorder, recurrent, in full remission: Secondary | ICD-10-CM

## 2023-07-20 NOTE — Progress Notes (Signed)
 [facility] MD OP Progress Note  07/20/2023 12:45 PM Barbara Russo  MRN:  [id number]  Chief Complaint:  Chief Complaint  Patient presents with   Follow-up   Anxiety   Depression   Medication Refill   HPI: Barbara Russo is a 63 year old Caucasian female, single, employed, lives in [place], has a history of MDD, binge eating disorder, headaches, hyperlipidemia was evaluated in office today.  The patient presents for follow-up of major depression, binge eating, and generalized anxiety disorder.  She has a history of major depression and is currently taking Wellbutrin , which she finds helpful for concentration and focus.  Denies thoughts of self-harm or harm to others. She is not currently seeing a therapist.  She has generalized anxiety disorder and is taking Buspar  at a low dosage, which she finds helpful. No new medications have been added, and she is working with her headache doctor to reduce gabapentin  and topiramate, which she believes may be affecting her memory.  She has a history of binge eating but currently denies binge eating behaviors. She describes her eating pattern as eating once a day due to a busy work schedule, leading to consuming a large amount of food at once. She is working on meal planning, including keeping healthy snacks like yogurt and blueberries at work.  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 She stepped wrong on a curb, resulting in a fall. She was placed in a boot rather than a cast and had difficulty using crutches. Her sister-in-law provided a scooter to aid mobility, and she was able to walk in the boot. She mentions residual effects from the injury and a newfound appreciation for maintaining physical fitness.  Visit Diagnosis:    ICD-10-CM   1. MDD (major depressive disorder), recurrent, in full remission (HCC)  F33.42     2. Moderate binge-eating disorder  F50.811     3. GAD (generalized anxiety disorder)   F41.1       Past Psychiatric History: I have reviewed past psychiatric history from progress note on 01/30/2018.  Past trials of BuSpar , Valium , Wellbutrin , Ritalin, Depakote, Vyvanse .  Past Medical History:  Past Medical History:  Diagnosis Date   Actinic keratosis    Anxiety    Depression    Dysplastic nevus 10/03/2019   L lower back paraspinal mod to severe (shave removal)   Dysplastic nevus 09/23/2020   R lat breast - moderate   Dysplastic nevus 09/29/2021   Left Upper Back 5.5cm lat to spine, medial to mid scapula, moderate atypia   Squamous cell carcinoma of skin 09/26/2019   SCCIS - L prox lat pretibial     Past Surgical History:  Procedure Laterality Date   BREAST BIOPSY Left 2008   neg    Family Psychiatric History: I have reviewed family psychiatric history from progress note on 01/30/2018.  Family History:  Family History  Adopted: Yes    Social History: I have reviewed social history from progress note on 01/30/2018. Social History   Socioeconomic History   Marital status: Single    Spouse name: Not on file   Number of children: 0   Years of education: Not on file   Highest education level: Bachelor's degree (e.g., BA, AB, BS)  Occupational History   Not on file  Tobacco Use   Smoking status: Never   Smokeless tobacco: Never  Vaping Use   Vaping status: Never Used  Substance and Sexual Activity   Alcohol use: Yes  Alcohol/week: 2.0 standard drinks of alcohol    Types: 2 Cans of beer per week   Drug use: Never   Sexual activity: Not Currently  Other Topics Concern   Not on file  Social History Narrative   Not on file   Social Drivers of Health   Financial Resource Strain: Low Risk  (06/28/2023)   Overall Financial Resource Strain (CARDIA)    Difficulty of Paying Living Expenses: Not hard at all  Food Insecurity: No Food Insecurity (06/28/2023)   Hunger Vital Sign    Worried About Running Out of Food in the Last Year: Never true    Ran Out  of Food in the Last Year: Never true  Transportation Needs: No Transportation Needs (06/28/2023)   PRAPARE - Administrator, Civil Service (Medical): No    Lack of Transportation (Non-Medical): No  Physical Activity: Unknown (06/28/2023)   Exercise Vital Sign    Days of Exercise per Week: 0 days    Minutes of Exercise per Session: Not on file  Stress: No Stress Concern Present (06/28/2023)   [employer] of Occupational Health - Occupational Stress Questionnaire    Feeling of Stress : Not at all  Social Connections: Moderately Isolated (06/28/2023)   Social Connection and Isolation Panel [NHANES]    Frequency of Communication with Friends and Family: Twice a week    Frequency of Social Gatherings with Friends and Family: Once a week    Attends Religious Services: 1 to 4 times per year    Active Member of [employer] or Organizations: No    Attends Engineer, Structural: Not on file    Marital Status: Never married    Allergies: No Known Allergies  Metabolic Disorder Labs: Lab Results  Component Value Date   HGBA1C 5.6 06/16/2023   No results found for: PROLACTIN Lab Results  Component Value Date   CHOL 213 (H) 06/16/2023   TRIG 101.0 06/16/2023   HDL 81.90 06/16/2023   CHOLHDL 3 06/16/2023   VLDL 20.2 06/16/2023   LDLCALC 111 (H) 06/16/2023   No results found for: TSH  Therapeutic Level Labs: No results found for: LITHIUM No results found for: VALPROATE No results found for: CBMZ  Current Medications: Current Outpatient Medications  Medication Sig Dispense Refill   botulinum toxin Type A (BOTOX) 200 units injection Inject into the muscle.     buPROPion  (WELLBUTRIN  XL) 300 MG 24 hr tablet TAKE 1 TABLET(300 MG) BY MOUTH DAILY 90 tablet 1   busPIRone  (BUSPAR ) 15 MG tablet TAKE 1 TABLET BY MOUTH EVERY MORNING, AT NOON, EVERY EVENING AND EVERY NIGHT AT BEDTIME 360 tablet 1   butalbital-acetaminophen-caffeine (FIORICET) 50-325-40 MG tablet Take by  mouth.     cetirizine (ZYRTEC) 10 MG tablet Take by mouth 2 (two) times daily.     famotidine (PEPCID) 40 MG tablet Take 1 tablet by mouth 2 (two) times daily.     fluticasone  (FLONASE ) 50 MCG/ACT nasal spray Place 2 sprays into both nostrils daily. 1 g 11   gabapentin  (NEURONTIN ) 400 MG capsule Take 400 mg by mouth 2 (two) times daily.     hydrocortisone 2.5 % cream Apply 1 Application topically 2 (two) times daily.     mometasone (ELOCON) 0.1 % cream Apply 1 Application topically daily.     REYVOW 100 MG TABS Take 1 tablet by mouth as needed.     simvastatin  (ZOCOR ) 20 MG tablet Take 1 tablet (20 mg total) by mouth daily. 90 tablet  3   topiramate (TOPAMAX) 100 MG tablet Take 100 mg by mouth 2 (two) times daily.     traMADol (ULTRAM) 50 MG tablet Take by mouth.     Vitamin D , Ergocalciferol , (DRISDOL ) 1.25 MG (50000 UNIT) CAPS capsule Take 1 capsule (50,000 Units total) by mouth every 7 (seven) days. 12 capsule 1   No current facility-administered medications for this visit.     Musculoskeletal: Strength & Muscle Tone: within normal limits Gait & Station: normal Patient leans: N/A  Psychiatric Specialty Exam: Review of Systems  Psychiatric/Behavioral: Negative.      Blood pressure 138/85, pulse (!) 102, temperature (!) 97.2 F (36.2 C), temperature source Skin, height 5' 10.5 (1.791 m), weight 191 lb (86.6 kg).Body mass index is 27.02 kg/m.  General Appearance: Fairly Groomed  Eye Contact:  Fair  Speech:  Normal Rate  Volume:  Normal  Mood:  Euthymic  Affect:  Congruent  Thought Process:  Goal Directed and Descriptions of Associations: Intact  Orientation:  Full (Time, Place, and Person)  Thought Content: Logical   Suicidal Thoughts:  No  Homicidal Thoughts:  No  Memory:  Immediate;   Fair Recent;   Fair Remote;   Fair  Judgement:  Fair  Insight:  Fair  Psychomotor Activity:  Normal  Concentration:  Concentration: Fair and Attention Span: Fair  Recall:  [employer] of  Knowledge: Fair  Language: Fair  Akathisia:  No  Handed:  Right  AIMS (if indicated): not done  Assets:  Desire for Improvement Housing Social Support  ADL's:  Intact  Cognition: WNL  Sleep:  Fair   Screenings: Midwife Visit from [facility]20/2023 in [facility]  AIMS Total Score 0      AUDIT    Flowsheet Row Office Visit from 06/29/2023 in [facility] [employer] from 06/20/2023 in [facility] [employer]  Alcohol Use Disorder Identification Test Final Score (AUDIT) 3  3       GAD-7    Flowsheet Row Office Visit from 07/11/2023 in [facility] [employer] Visit from 06/29/2023 in [facility] [employer] Visit from 04/20/2023 in [facility] Office Visit from 03/21/2023 in [facility] [employer] Visit from 02/10/2022 in [facility]  Total GAD-7 Score 2 1 2 2  0      PHQ2-9    Flowsheet Row Office Visit from 07/11/2023 in [facility] Visit from 06/29/2023 in [facility] [employer] Visit from 04[facility]20/2023 in [facility] Office Visit from 03/21/2023 in [facility] Video Visit from 06/23/2022 in [facility]  PHQ-2 Total Score 2 1 0 2 0  PHQ-9 Total Score 5 4 -- 7 --      Flowsheet Row Office Visit from 07/20/2023 in [facility] Office Visit from 04/20/2023 in [facility] Video Visit from 11/15/2022 in [facility]  C-SSRS RISK CATEGORY No Risk No Risk No Risk         Assessment and Plan: Barbara Russo is a 63 year old Caucasian female, single, lives in [place], has a history of depression, binge eating disorder, chronic headache was evaluated in office today.  Patient is currently stable, discussed assessment and plan as noted below.  Major Depression in remission Major depression, well-managed with Wellbutrin  and Buspar . No thoughts of self-harm or harm to others. Wellbutrin  aids concentration and focus. - Continue Wellbutrin  XL 300 mg daily - Continue Buspar  15 mg 4 times daily - Monitor mood and behavior changes  Generalized Anxiety Disorder-stable Generalized anxiety disorder, well-managed with Buspar . No reported issues with current management. Buspar  is unlikely to cause memory issues. - Continue Buspar  15 mg 4 times daily - Monitor anxiety symptoms  Binge Eating Disorder - stable Binge eating disorder with unhealthy eating patterns, such as eating once a day and consuming large amounts of food at night. Discussed mindful eating and meal planning to prevent gastroesophageal reflux and other issues. - Encourage mindful eating practices - Plan meals and snacks ahead of time - Consider referral to a lifestyle medicine specialist if needed  General Health Maintenance Discussed balanced diet, regular exercise, and structured daily routines. Emphasized meal planning to avoid unhealthy eating patterns. Suggested Mediterranean diet and low-calorie, healthy snacks. - Plan meals and snacks ahead of time - Set reminders for meal and snack times - Consider low-calorie, healthy snacks like fruits, baby carrots, celery sticks, Greek yogurt, and protein shakes  Follow-up - Follow up in three months - Send refill requests through pharmacy or MyChart when due.   Consent: Patient/Guardian gives verbal consent for treatment and assignment of benefits for services provided during this visit. Patient/Guardian expressed understanding and agreed to  proceed.   This note was generated in part or whole with voice recognition software. Voice recognition is usually quite accurate but there are transcription errors that can and very often do occur. I apologize for any typographical errors that were not detected and corrected.    Azaiah Mello, MD 07/20/2023, 12:45 PM

## 2023-07-20 NOTE — Patient Instructions (Signed)
 Mediterranean Diet A Mediterranean diet is based on the traditions of countries on the [employer]. It focuses on eating more: Fruits and vegetables. Whole grains, beans, nuts, and seeds. Heart-healthy fats. These are fats that are good for your heart. It involves eating less: Dairy. Meat and eggs. Processed foods with added sugar, salt, and fat. This type of diet can help prevent certain conditions. It can also improve outcomes if you have a long-term (chronic) disease, such as kidney or heart disease. What are tips for following this plan? Reading food labels Check packaged foods for: The serving size. For foods such as rice and pasta, the serving size is the amount of cooked product, not dry. The total fat. Avoid foods with saturated fat or trans fat. Added sugars, such as corn syrup. Shopping  Try to have a balanced diet. Buy a variety of foods, such as: Fresh fruits and vegetables. You may be able to get these from local farmers markets. You can also buy them frozen. Grains, beans, nuts, and seeds. Some of these can be bought in bulk. Fresh seafood. Poultry and eggs. Low-fat dairy products. Buy whole ingredients instead of foods that have already been packaged. If you can't get fresh seafood, buy precooked frozen shrimp or canned fish, such as tuna, salmon, or sardines. Stock your pantry so you always have certain foods on hand, such as olive oil, canned tuna, canned tomatoes, rice, pasta, and beans. Cooking Cook foods with extra-virgin olive oil instead of using butter or other vegetable oils. Have meat as a side dish. Have vegetables or grains as your main dish. This means having meat in small portions or adding small amounts of meat to foods like pasta or stew. Use beans or vegetables instead of meat in common dishes like chili or lasagna. Try out different cooking methods. Try roasting, broiling, steaming, and sauting vegetables. Add frozen vegetables to soups, stews,  pasta, or rice. Add nuts or seeds for added healthy fats and plant protein at each meal. You can add these to yogurt, salads, or vegetable dishes. Marinate fish or vegetables using olive oil, lemon juice, garlic, and fresh herbs. Meal planning Plan to eat a vegetarian meal one day each week. Try to work up to two vegetarian meals, if possible. Eat seafood two or more times a week. Have healthy snacks on hand. These may include: Vegetable sticks with hummus. Greek yogurt. Fruit and nut trail mix. Eat balanced meals. These should include: Fruit: 2-3 servings a day. Vegetables: 4-5 servings a day. Low-fat dairy: 2 servings a day. Fish, poultry, or lean meat: 1 serving a day. Beans and legumes: 2 or more servings a week. Nuts and seeds: 1-2 servings a day. Whole grains: 6-8 servings a day. Extra-virgin olive oil: 3-4 servings a day. Limit red meat and sweets to just a few servings a month. Lifestyle  Try to cook and eat meals with your family. Drink enough fluid to keep your pee (urine) pale yellow. Be active every day. This includes: Aerobic exercise, which is exercise that causes your heart to beat faster. Examples include running and swimming. Leisure activities like gardening, walking, or housework. Get 7-8 hours of sleep each night. Drink red wine if your provider says you can. A glass of wine is 5 oz (150 mL). You may be allowed to have: Up to 1 glass a day if you're female and not pregnant. Up to 2 glasses a day if you're female. What foods should I eat? Fruits Apples. Apricots. Avocado.  Berries. Bananas. Cherries. Dates. Figs. Grapes. Lemons. Melon. Oranges. Peaches. Plums. Pomegranate. Vegetables Artichokes. Beets. Broccoli. Cabbage. Carrots. Eggplant. Green beans. Chard. Kale. Spinach. Onions. Leeks. Peas. Squash. Tomatoes. Peppers. Radishes. Grains Whole-grain pasta. Brown rice. Bulgur wheat. Polenta. Couscous. Whole-wheat bread. [place]neil Madeira. Meats and other  proteins Beans. Almonds. Sunflower seeds. Pine nuts. Peanuts. Cod. Salmon. Scallops. Shrimp. Tuna. Tilapia. Clams. Oysters. Eggs. Chicken or [place] without skin. Dairy Low-fat milk. Cheese. Greek yogurt. Fats and oils Extra-virgin olive oil. Avocado oil. Grapeseed oil. Beverages Water. Red wine. Herbal tea. Sweets and desserts Greek yogurt with honey. Baked apples. Poached pears. Trail mix. Seasonings and condiments Basil. Cilantro. Coriander. Cumin. Mint. Parsley. Sage. Rosemary. Tarragon. Garlic. Oregano. Thyme. Pepper. Balsamic vinegar. Tahini. Hummus. Tomato sauce. Olives. Mushrooms. The items listed above may not be all the foods and drinks you can have. Talk to a dietitian to learn more. What foods should I limit? This is a list of foods that should be eaten rarely. Fruits Fruit canned in syrup. Vegetables Deep-fried potatoes, like [place] fries. Grains Packaged pasta or rice dishes. Cereal with added sugar. Snacks with added sugar. Meats and other proteins Beef. Pork. Lamb. Chicken or [place] with skin. Hot dogs. [place]. Dairy Ice cream. Sour cream. Whole milk. Fats and oils Butter. Canola oil. Vegetable oil. Beef fat (tallow). Lard. Beverages Juice. Sugar-sweetened soft drinks. Beer. Liquor and spirits. Sweets and desserts Cookies. Cakes. Pies. Candy. Seasonings and condiments Mayonnaise. Pre-made sauces and marinades. The items listed above may not be all the foods and drinks you should limit. Talk to a dietitian to learn more. Where to find more information American Heart Association (AHA): heart.org This information is not intended to replace advice given to you by your health care provider. Make sure you discuss any questions you have with your health care provider. Document Revised: 09/11/2022 Document Reviewed: 09/11/2022 Elsevier Patient Education  2024 [employer].

## 2023-08-02 ENCOUNTER — Ambulatory Visit: Payer: BC Managed Care – PPO | Admitting: Family Medicine

## 2023-08-30 ENCOUNTER — Ambulatory Visit (INDEPENDENT_AMBULATORY_CARE_PROVIDER_SITE_OTHER): Payer: Self-pay | Admitting: Dermatology

## 2023-08-30 ENCOUNTER — Ambulatory Visit: Payer: BC Managed Care – PPO | Admitting: Dermatology

## 2023-08-30 ENCOUNTER — Encounter: Payer: Self-pay | Admitting: Dermatology

## 2023-08-30 DIAGNOSIS — W908XXA Exposure to other nonionizing radiation, initial encounter: Secondary | ICD-10-CM

## 2023-08-30 DIAGNOSIS — L814 Other melanin hyperpigmentation: Secondary | ICD-10-CM

## 2023-08-30 DIAGNOSIS — L578 Other skin changes due to chronic exposure to nonionizing radiation: Secondary | ICD-10-CM

## 2023-08-30 DIAGNOSIS — L821 Other seborrheic keratosis: Secondary | ICD-10-CM

## 2023-08-30 NOTE — Progress Notes (Signed)
 Follow-Up Visit   Subjective  Barbara Russo is a 63 y.o. female who presents for the following: bbl treatment today this is patient 2nd treatment   The patient has spots, moles and lesions to be evaluated, some may be new or changing and the patient may have concern these could be cancer.  The following portions of the chart were reviewed this encounter and updated as appropriate: medications, allergies, medical history  Review of Systems:  No other skin or systemic complaints except as noted in HPI or Assessment and Plan.  Objective  Well appearing patient in no apparent distress; mood and affect are within normal limits.  A focused examination was performed of the following areas: face Relevant exam findings are noted in the Assessment and Plan.        Assessment & Plan   ACTINIC DAMAGE - chronic, secondary to cumulative UV radiation exposure/sun exposure over time - diffuse scaly erythematous macules with underlying dyspigmentation - Recommend daily broad spectrum sunscreen SPF 30+ to sun-exposed areas, reapply every 2 hours as needed.  - Recommend staying in the shade or wearing long sleeves, sun glasses (UVA+UVB protection) and wide brim hats (4-inch brim around the entire circumference of the hat). - Call for new or changing lesions.  SEBORRHEIC KERATOSIS - Stuck-on, waxy, tan-brown papules and/or plaques  - Benign-appearing - Discussed benign etiology and prognosis. - Observe - Call for any changes  LENTIGINES Exam: scattered tan macules Treatment Plan:  BBL treatment today 2nd treatment   Laser safety: Patient was advised in laser safety.  Patient was fitted with laser safety goggles and advised to keep eyes closed during procedure with goggles on. Staff and provider ensured that patient and their own safety goggles were also on and eyes protected during procedure. Laser room door was secured and locked from the inside. Laser room door has laser  safety sign affixed to the outside of the door.    [place] BBL - 08/30/23 1400      Patient Details   Skin Type: II    Anesthestic Cream Applied: No    Photo Takes: Yes    Consent Signed: Yes    Improvement from Previous Treatment: Yes   see previous visit date 10/2022    Treatment Details   Date: 08/30/23    Treatment #: 2    Area: face    Filter: 1st Pass;2nd Pass      1st Pass   Location: F    Device: 515 filter   pigmented lesions at forehead, cheeks, chin, upper  lip, temples   BBL j/cm2: 15    PW Msec Sec: 15    Cooling Temp: 15    Pulses: 30    7mm: this one      2nd Pass   Location: F    Device: 515 filter   pigmented lesions forehead, temples, cheeks, chin,  upper lip and nose   BBL j/cm2: 12    PW Msec Sec: 10    Cooling Temp: 25    Pulses: 158    11mm: this one     Patient tolerated the procedure well.   Wynelle Link avoidance was stressed. The patient will call with any problems, questions or concerns prior to their next appointment.  Pt is currently taking prophylactic Valacyclovir for fever blister prevention.  As scheduled for follow up  I, Asher Muir, CMA, am acting as scribe for Armida Sans, MD.   Documentation: I have reviewed the above documentation for accuracy and  completeness, and I agree with the above.  Armida Sans, MD

## 2023-08-30 NOTE — Patient Instructions (Signed)

## 2023-09-04 ENCOUNTER — Other Ambulatory Visit: Payer: Self-pay | Admitting: Psychiatry

## 2023-09-04 DIAGNOSIS — F3342 Major depressive disorder, recurrent, in full remission: Secondary | ICD-10-CM

## 2023-10-02 ENCOUNTER — Other Ambulatory Visit: Payer: Self-pay | Admitting: Psychiatry

## 2023-10-02 DIAGNOSIS — F411 Generalized anxiety disorder: Secondary | ICD-10-CM

## 2023-10-05 ENCOUNTER — Ambulatory Visit: Payer: BC Managed Care – PPO | Admitting: Dermatology

## 2023-10-05 ENCOUNTER — Encounter: Payer: Self-pay | Admitting: Dermatology

## 2023-10-05 DIAGNOSIS — D492 Neoplasm of unspecified behavior of bone, soft tissue, and skin: Secondary | ICD-10-CM

## 2023-10-05 DIAGNOSIS — L729 Follicular cyst of the skin and subcutaneous tissue, unspecified: Secondary | ICD-10-CM

## 2023-10-05 DIAGNOSIS — L820 Inflamed seborrheic keratosis: Secondary | ICD-10-CM | POA: Diagnosis not present

## 2023-10-05 DIAGNOSIS — D229 Melanocytic nevi, unspecified: Secondary | ICD-10-CM

## 2023-10-05 DIAGNOSIS — L570 Actinic keratosis: Secondary | ICD-10-CM

## 2023-10-05 DIAGNOSIS — Z1283 Encounter for screening for malignant neoplasm of skin: Secondary | ICD-10-CM | POA: Diagnosis not present

## 2023-10-05 DIAGNOSIS — D225 Melanocytic nevi of trunk: Secondary | ICD-10-CM

## 2023-10-05 DIAGNOSIS — L578 Other skin changes due to chronic exposure to nonionizing radiation: Secondary | ICD-10-CM

## 2023-10-05 DIAGNOSIS — L720 Epidermal cyst: Secondary | ICD-10-CM

## 2023-10-05 DIAGNOSIS — D1801 Hemangioma of skin and subcutaneous tissue: Secondary | ICD-10-CM

## 2023-10-05 DIAGNOSIS — W908XXA Exposure to other nonionizing radiation, initial encounter: Secondary | ICD-10-CM | POA: Diagnosis not present

## 2023-10-05 DIAGNOSIS — Z8589 Personal history of malignant neoplasm of other organs and systems: Secondary | ICD-10-CM

## 2023-10-05 DIAGNOSIS — Z85828 Personal history of other malignant neoplasm of skin: Secondary | ICD-10-CM

## 2023-10-05 DIAGNOSIS — Z86018 Personal history of other benign neoplasm: Secondary | ICD-10-CM

## 2023-10-05 DIAGNOSIS — L814 Other melanin hyperpigmentation: Secondary | ICD-10-CM

## 2023-10-05 DIAGNOSIS — L821 Other seborrheic keratosis: Secondary | ICD-10-CM

## 2023-10-05 NOTE — Patient Instructions (Signed)

## 2023-10-05 NOTE — Progress Notes (Signed)
 Follow-Up Visit   Subjective  Barbara Russo is a 63 y.o. female who presents for the following: Skin Cancer Screening and Full Body Skin Exam, hx of SCC, hx of Dysplastic nevus,  The patient presents for Total-Body Skin Exam (TBSE) for skin cancer screening and mole check. The patient has spots, moles and lesions to be evaluated, some may be new or changing and the patient may have concern these could be cancer.  The following portions of the chart were reviewed this encounter and updated as appropriate: medications, allergies, medical history  Review of Systems:  No other skin or systemic complaints except as noted in HPI or Assessment and Plan.  Objective  Well appearing patient in no apparent distress; mood and affect are within normal limits.  A full examination was performed including scalp, head, eyes, ears, nose, lips, neck, chest, axillae, abdomen, back, buttocks, bilateral upper extremities, bilateral lower extremities, hands, feet, fingers, toes, fingernails, and toenails. All findings within normal limits unless otherwise noted below.   Relevant physical exam findings are noted in the Assessment and Plan.  face x 3, right inframammary x 14, left inframammary x  9 (26) Stuck-on, waxy, tan-brown papule or plaque --Discussed benign etiology and prognosis.  face x 3 (3) Erythematous thin papules/macules with gritty scale.  Left mid back at braline 9 cm lat to spine 0.4 cm irregular brown macule right  mid back at braline 4.5 cm lat to spine 0.6 cm irregular brown macule  left mid back above braline 6 cm lat to spine 0.6 cm irregular brown macule            Assessment & Plan   SKIN CANCER SCREENING PERFORMED TODAY.  LENTIGINES, SEBORRHEIC KERATOSES, HEMANGIOMAS - Benign normal skin lesions - Benign-appearing - Call for any changes  MELANOCYTIC NEVI - Tan-brown and/or pink-flesh-colored symmetric macules and papules - Benign appearing on exam today -  Observation - Call clinic for new or changing moles - Recommend daily use of broad spectrum spf 30+ sunscreen to sun-exposed areas.   Milia - tiny firm white papules - type of cyst - benign - sometimes these will clear with nightly OTC adapalene/Differin 0.1% gel or retinol. - may be extracted if symptomatic - observe   HISTORY OF SQUAMOUS CELL CARCINOMA OF THE SKIN - No evidence of recurrence today - No lymphadenopathy - Recommend regular full body skin exams - Recommend daily broad spectrum sunscreen SPF 30+ to sun-exposed areas, reapply every 2 hours as needed.  - Call if any new or changing lesions are noted between office visits   HISTORY OF DYSPLASTIC NEVUS No evidence of recurrence today Recommend regular full body skin exams Recommend daily broad spectrum sunscreen SPF 30+ to sun-exposed areas, reapply every 2 hours as needed.  Call if any new or changing lesions are noted between office visits    INFLAMED SEBORRHEIC KERATOSIS (26) face x 3, right inframammary x 14, left inframammary x  9 (26) Symptomatic, irritating, patient would like treated.  Destruction of lesion - face x 3, right inframammary x 14, left inframammary x  9 (26) Complexity: simple   Destruction method: cryotherapy   Informed consent: discussed and consent obtained   Timeout:  patient name, date of birth, surgical site, and procedure verified Lesion destroyed using liquid nitrogen: Yes   Region frozen until ice ball extended beyond lesion: Yes   Outcome: patient tolerated procedure well with no complications   Post-procedure details: wound care instructions given   AK (ACTINIC KERATOSIS) (  3) face x 3 (3) ACTINIC DAMAGE - chronic, secondary to cumulative UV radiation exposure/sun exposure over time - diffuse scaly erythematous macules with underlying dyspigmentation - Recommend daily broad spectrum sunscreen SPF 30+ to sun-exposed areas, reapply every 2 hours as needed.  - Recommend staying in the  shade or wearing long sleeves, sun glasses (UVA+UVB protection) and wide brim hats (4-inch brim around the entire circumference of the hat). - Call for new or changing lesions.  Destruction of lesion - face x 3 (3) Complexity: simple   Destruction method: cryotherapy   Informed consent: discussed and consent obtained   Timeout:  patient name, date of birth, surgical site, and procedure verified Lesion destroyed using liquid nitrogen: Yes   Region frozen until ice ball extended beyond lesion: Yes   Outcome: patient tolerated procedure well with no complications   Post-procedure details: wound care instructions given   NEOPLASM OF SKIN (3) Left mid back at braline 9 cm lat to spine Epidermal / dermal shaving  Lesion diameter (cm):  0.4 Informed consent: discussed and consent obtained   Timeout: patient name, date of birth, surgical site, and procedure verified   Procedure prep:  Patient was prepped and draped in usual sterile fashion Prep type:  Isopropyl alcohol Anesthesia: the lesion was anesthetized in a standard fashion   Anesthetic:  1% lidocaine w/ epinephrine 1-100,000 buffered w/ 8.4% NaHCO3 Hemostasis achieved with: pressure, aluminum chloride and electrodesiccation   Outcome: patient tolerated procedure well   Post-procedure details: sterile dressing applied and wound care instructions given   Dressing type: bandage and petrolatum   Specimen 1 - Surgical pathology Differential Diagnosis: R/O Dysplastic nevus   Check Margins: No right  mid back at braline 4.5 cm lat to spine Epidermal / dermal shaving  Lesion diameter (cm):  0.6 Informed consent: discussed and consent obtained   Timeout: patient name, date of birth, surgical site, and procedure verified   Procedure prep:  Patient was prepped and draped in usual sterile fashion Prep type:  Isopropyl alcohol Anesthesia: the lesion was anesthetized in a standard fashion   Anesthetic:  1% lidocaine w/ epinephrine 1-100,000  buffered w/ 8.4% NaHCO3 Hemostasis achieved with: pressure, aluminum chloride and electrodesiccation   Outcome: patient tolerated procedure well   Post-procedure details: sterile dressing applied and wound care instructions given   Dressing type: bandage and petrolatum   Specimen 2 - Surgical pathology Differential Diagnosis:  R/O Dysplastic nevus   Check Margins: No left mid back above braline 6 cm lat to spine Epidermal / dermal shaving  Lesion diameter (cm):  0.6 Informed consent: discussed and consent obtained   Timeout: patient name, date of birth, surgical site, and procedure verified   Procedure prep:  Patient was prepped and draped in usual sterile fashion Prep type:  Isopropyl alcohol Anesthesia: the lesion was anesthetized in a standard fashion   Anesthetic:  1% lidocaine w/ epinephrine 1-100,000 buffered w/ 8.4% NaHCO3 Hemostasis achieved with: pressure, aluminum chloride and electrodesiccation   Outcome: patient tolerated procedure well   Post-procedure details: sterile dressing applied and wound care instructions given   Dressing type: bandage and petrolatum   Specimen 3 - Surgical pathology Differential Diagnosis:  R/O Dysplastic nevus   Check Margins: No   Return in about 1 year (around 10/04/2024) for TBSE, hx of SCC, hx of Dysplastic nevus .  Jacob Master, CMA, am acting as scribe for Celine Collard, MD .   Documentation: I have reviewed the above documentation for accuracy and  completeness, and I agree with the above.  Celine Collard, MD

## 2023-10-10 ENCOUNTER — Encounter: Payer: Self-pay | Admitting: Family Medicine

## 2023-10-10 LAB — SURGICAL PATHOLOGY

## 2023-10-11 ENCOUNTER — Telehealth: Payer: Self-pay

## 2023-10-11 ENCOUNTER — Encounter: Payer: Self-pay | Admitting: Dermatology

## 2023-10-11 NOTE — Telephone Encounter (Signed)
 Patient informed of pathology results. 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

## 2023-10-11 NOTE — Telephone Encounter (Signed)
-----   Message from Celine Collard sent at 10/11/2023  4:35 PM EDT ----- FINAL DIAGNOSIS        1. Skin, left mid back at braline 9 cm lat to spine :       DYSPLASTIC JUNCTIONAL LENTIGINOUS NEVUS WITH MODERATE ATYPIA, CLOSE TO MARGIN        2. Skin, right mid back at braline 4.5 cm lat to spine :       DYSPLASTIC COMPOUND NEVUS WITH MODERATE ATYPIA, DEEP MARGIN INVOLVED        3. Skin, left mid back above braline 6 cm lat to spine :       DYSPLASTIC JUNCTIONAL NEVUS WITH MODERATE ATYPIA, CLOSE TO MARGIN   1, 2, 3 - all three Moderate Dysplastic Recheck next visit

## 2023-10-12 ENCOUNTER — Ambulatory Visit: Admitting: Dermatology

## 2023-10-16 ENCOUNTER — Ambulatory Visit: Payer: BC Managed Care – PPO | Admitting: Psychiatry

## 2023-10-23 ENCOUNTER — Encounter: Payer: Self-pay | Admitting: Family Medicine

## 2023-10-23 ENCOUNTER — Ambulatory Visit: Admitting: Family Medicine

## 2023-10-23 VITALS — BP 120/72 | HR 90 | Temp 98.2°F | Resp 20 | Ht 70.5 in | Wt 197.4 lb

## 2023-10-23 DIAGNOSIS — R635 Abnormal weight gain: Secondary | ICD-10-CM

## 2023-10-23 DIAGNOSIS — L821 Other seborrheic keratosis: Secondary | ICD-10-CM | POA: Diagnosis not present

## 2023-10-23 LAB — TSH: TSH: 1.35 u[IU]/mL (ref 0.35–5.50)

## 2023-10-23 MED ORDER — TIRZEPATIDE-WEIGHT MANAGEMENT 2.5 MG/0.5ML ~~LOC~~ SOLN: 2.5000 mg | SUBCUTANEOUS | 0 refills | Status: DC

## 2023-10-23 NOTE — Progress Notes (Unsigned)
 SUBJECTIVE:   Chief Complaint  Patient presents with   Rash    Pt see a dermatologist    HPI Presents for acute visit  Discussed the use of AI scribe software for clinical note transcription with the patient, who gave verbal consent to proceed.  History of Present Illness Barbara Russo is a 63 year old female who presents with recurrent seborrheic keratosis after cryotherapy treatment.  She has recurrent seborrheic keratosis, previously treated with cryotherapy by her dermatologist approximately one month ago. Initially, she had nine lesions on one side and fourteen on the other, which provided significant relief for the first time in over a year. However, some lesions have returned, accompanied by persistent itching, particularly at night, and redness under the affected area. The lesions blistered after the cryotherapy treatment, and while they are not painful, they are intensely itchy.  She has a history of weight fluctuations, gaining and losing up to forty pounds over her lifetime. Weight gain exacerbates her neck and back pain. She is currently trying to manage her weight and has expressed interest in new medications for weight loss.  She mentions a history of using Botox for headaches and is currently trying to reduce her gabapentin dosage. She has previously tried medications like Nurtec for her headaches but had a bad reaction to a nasal spray medication. She has a history of receiving steroid shots and Botox for headache management and is considering nerve block treatment for her headaches.  She received the shingles vaccine about a year ago and denies any history of shingles.     PERTINENT PMH / PSH: As above   OBJECTIVE:  BP 120/72   Pulse 90   Temp 98.2 F (36.8 C)   Resp 20   Ht 5' 10.5" (1.791 m)   Wt 197 lb 6 oz (89.5 kg)   SpO2 99%   BMI 27.92 kg/m    Physical Exam Vitals reviewed.  Constitutional:      General: She is not in acute distress.     Appearance: Normal appearance. She is normal weight. She is not ill-appearing, toxic-appearing or diaphoretic.  Eyes:     General:        Right eye: No discharge.        Left eye: No discharge.     Conjunctiva/sclera: Conjunctivae normal.  Cardiovascular:     Rate and Rhythm: Normal rate.  Pulmonary:     Effort: Pulmonary effort is normal.  Musculoskeletal:        General: Normal range of motion.  Skin:    General: Skin is warm and dry.     Findings: Rash present.  Neurological:     General: No focal deficit present.     Mental Status: She is alert and oriented to person, place, and time. Mental status is at baseline.  Psychiatric:        Mood and Affect: Mood normal.        Behavior: Behavior normal.        Thought Content: Thought content normal.        Judgment: Judgment normal.                10/23/2023   10:02 AM 07/11/2023    4:15 PM 06/29/2023    2:35 PM 04/20/2023   11:27 AM 03/21/2023   10:18 AM  Depression screen PHQ 2/9  Decreased Interest 1 1 1  1   Down, Depressed, Hopeless 1 1 0  1  PHQ - 2  Score 2 2 1  2   Altered sleeping 0 0 0  1  Tired, decreased energy 1 1 1  2   Change in appetite 1 1 1  1   Feeling bad or failure about yourself  1 1 1  1   Trouble concentrating 0 0 0  0  Moving slowly or fidgety/restless 0 0 0  0  Suicidal thoughts 0 0 0  0  PHQ-9 Score 5 5 4  7   Difficult doing work/chores Not difficult at all Not difficult at all Not difficult at all  Not difficult at all     Information is confidential and restricted. Go to Review Flowsheets to unlock data.      10/23/2023   10:03 AM 07/11/2023    4:16 PM 06/29/2023    2:35 PM 04/20/2023   11:27 AM  GAD 7 : Generalized Anxiety Score  Nervous, Anxious, on Edge 0 0 0   Control/stop worrying 0 0 0   Worry too much - different things 0 1 0   Trouble relaxing 0 0 0   Restless 0 0 0   Easily annoyed or irritable 1 1 1    Afraid - awful might happen 1 0 0   Total GAD 7 Score 2 2 1    Anxiety  Difficulty Not difficult at all Not difficult at all Not difficult at all      Information is confidential and restricted. Go to Review Flowsheets to unlock data.    ASSESSMENT/PLAN:  Weight gain Assessment & Plan: Chronic obesity with significant weight gain. Interested in weight loss medications, particularly those used for diabetes management. Discussed Wegovy and Zepbound, both injectable, with uncertain insurance coverage. Potential side effects include medullary thyroid cancer, pancreatitis, constipation, and bloating. Emphasized lifestyle changes alongside medication. Discussed [facility] for structured weight management.  No history of thyroid cancer, family history unknown as patient adopted.  No history of pancreatitis.  - Recent labs acceptable.  Will check TSH - Start Zepbound 2.5 mg weekly - Provide information on [facility]  Orders: -     Tirzepatide-Weight Management; Inject 2.5 mg into the skin once a week.  Dispense: 2 mL; Refill: 0 -     TSH  Seborrheic keratosis Assessment & Plan: Chronic seborrheic keratosis with recent cryotherapy. Lesions recurred under breast area, causing itching and discomfort. Blisters present post-cryotherapy, no pain reported. Differential diagnosis includes shingles or herpes, but unlikely. Suspect common reaction post cryotherapy.   Advised follow-up with dermatologist for further evaluation. - I did reach her dermatologist who reviewed images of current rash and agreed that was normal process from recent procedure.  - Avoid wearing wired bras to reduce irritation and keep the area dry.      PDMP reviewed  Return in about 4 weeks (around 11/20/2023) for PCP.  Valli Gaw, MD

## 2023-10-23 NOTE — Patient Instructions (Addendum)
 It was a pleasure meeting you today. Thank you for allowing me to take part in your health care.  Our goals for today as we discussed include:  Checking thyroid level today  Start Zepbound 2.5 mg weekly Follow up in 4 weeks  Follow up with Dermatology for rash.  Schedule for PAP as soon as possible  Colonoscopy is due  This is a list of the screening recommended for you and due dates:  Health Maintenance  Topic Date Due   Pap with HPV screening  Never done   Colon Cancer Screening  Never done   COVID-19 Vaccine (5 - 2024-25 season) 02/12/2023   Hepatitis C Screening  07/11/2024*   HIV Screening  07/11/2024*   Flu Shot  01/12/2024   Mammogram  11/23/2024   DTaP/Tdap/Td vaccine (2 - Td or Tdap) 02/16/2028   Zoster (Shingles) Vaccine  Completed   HPV Vaccine  Aged Out   Meningitis B Vaccine  Aged Out  *Topic was postponed. The date shown is not the original due date.      If you have any questions or concerns, please do not hesitate to call the office at [phone].  I look forward to our next visit and until then take care and stay safe.  Regards,   Valli Gaw, MD   [facility]

## 2023-10-24 ENCOUNTER — Ambulatory Visit: Admitting: Psychiatry

## 2023-10-25 ENCOUNTER — Other Ambulatory Visit (HOSPITAL_COMMUNITY): Payer: Self-pay

## 2023-10-25 ENCOUNTER — Telehealth: Payer: Self-pay

## 2023-10-25 NOTE — Telephone Encounter (Signed)
Pt notified though my chart

## 2023-10-25 NOTE — Telephone Encounter (Signed)
 Pharmacy Patient Advocate Encounter   Received notification from Onbase that prior authorization for Zepbound 2.5 mg/0.5 ml auto injector is required/requested.   Insurance verification completed.   The patient is insured through [facility] .   Per test claim: Anti-Obesity drugs not covered

## 2023-10-27 ENCOUNTER — Encounter: Payer: Self-pay | Admitting: Family Medicine

## 2023-10-27 DIAGNOSIS — R635 Abnormal weight gain: Secondary | ICD-10-CM | POA: Insufficient documentation

## 2023-10-27 DIAGNOSIS — L821 Other seborrheic keratosis: Secondary | ICD-10-CM | POA: Insufficient documentation

## 2023-10-27 NOTE — Assessment & Plan Note (Signed)
 Chronic seborrheic keratosis with recent cryotherapy. Lesions recurred under breast area, causing itching and discomfort. Blisters present post-cryotherapy, no pain reported. Differential diagnosis includes shingles or herpes, but unlikely. Suspect common reaction post cryotherapy.   Advised follow-up with dermatologist for further evaluation. - I did reach her dermatologist who reviewed images of current rash and agreed that was normal process from recent procedure.  - Avoid wearing wired bras to reduce irritation and keep the area dry.

## 2023-10-27 NOTE — Assessment & Plan Note (Signed)
 Chronic obesity with significant weight gain. Interested in weight loss medications, particularly those used for diabetes management. Discussed Wegovy and Zepbound, both injectable, with uncertain insurance coverage. Potential side effects include medullary thyroid cancer, pancreatitis, constipation, and bloating. Emphasized lifestyle changes alongside medication. Discussed [facility] for structured weight management.  No history of thyroid cancer, family history unknown as patient adopted.  No history of pancreatitis.  - Recent labs acceptable.  Will check TSH - Start Zepbound 2.5 mg weekly - Provide information on [facility]

## 2023-11-14 ENCOUNTER — Telehealth: Payer: Self-pay

## 2023-11-14 NOTE — Telephone Encounter (Signed)
 Copied from CRM [id number]. Topic: Clinical - Medication Prior Auth >> Nov 14, 2023  1:47 PM Alyse July wrote: Reason for CRM: per patient message in [facility] "   10/25/23  2:43 PM Hi [name], Thank you! I called [employer], they stated Barbara Russo was on their list and that I might be approved with higher BMI levels (I think i'm approx. 2 .8) and at least 1 weight related med. condition such as high cholesterol. Dr. Sueanne Emerald had to increase my simvastatin  last visit, and I've had to have my liver tests over the last couple of years due to elevated blood work (it's been fatty and I see gastrointestinal for this). I hope this is helpful and you and Dr. Sueanne Emerald can help.   I'm so very appreciative. Barbara Russo

## 2023-11-22 ENCOUNTER — Other Ambulatory Visit: Payer: Self-pay | Admitting: Family Medicine

## 2023-11-22 ENCOUNTER — Encounter: Payer: Self-pay | Admitting: Family Medicine

## 2023-11-22 DIAGNOSIS — E785 Hyperlipidemia, unspecified: Secondary | ICD-10-CM

## 2023-11-22 MED ORDER — SIMVASTATIN 40 MG PO TABS
40.0000 mg | ORAL_TABLET | Freq: Every day | ORAL | 3 refills | Status: AC
Start: 2023-11-22 — End: ?

## 2023-12-13 ENCOUNTER — Ambulatory Visit: Admitting: Psychiatry

## 2023-12-25 ENCOUNTER — Telehealth: Payer: Self-pay

## 2023-12-25 NOTE — Telephone Encounter (Signed)
 Copied from CRM [id number]. Topic: Clinical - Medication Question >> Dec 25, 2023 10:00 AM Turkey A wrote: Reason for CRM: Patient would like a call back regarding a new prescription for Zepbound  to be self-pay

## 2023-12-25 NOTE — Telephone Encounter (Signed)
 Left message to return call to our office.

## 2023-12-26 NOTE — Telephone Encounter (Unsigned)
 Copied from CRM [id number]. Topic: Clinical - Medication Question >> Dec 26, 2023  1:11 PM Franky GRADE wrote: Patient is returning a call she received from [place], called CAL but Burnard was not available.

## 2023-12-27 ENCOUNTER — Other Ambulatory Visit: Payer: Self-pay

## 2023-12-27 DIAGNOSIS — R635 Abnormal weight gain: Secondary | ICD-10-CM

## 2023-12-27 MED ORDER — TIRZEPATIDE-WEIGHT MANAGEMENT 2.5 MG/0.5ML ~~LOC~~ SOLN
2.5000 mg | SUBCUTANEOUS | 0 refills | Status: DC
Start: 2023-12-27 — End: 2024-01-12

## 2023-12-27 NOTE — Telephone Encounter (Signed)
 Called pt and let her know that I have sent to the pharmacy requested.

## 2023-12-27 NOTE — Telephone Encounter (Unsigned)
 Copied from CRM [id number]. Topic: Clinical - Medication Question >> Dec 25, 2023 10:00 AM [place] A wrote: Reason for CRM: Patient would like a call back regarding a new prescription for Zepbound  to be self-pay >> Dec 27, 2023  8:57 AM Emylou G wrote: Patient called back.. would like to switch to selfpay?  Can this be done?  If they write it to [employer] it will save her money. Their NPI if you need it: [id number] address: [id number] [place] [address] NCP: [id number] >> Dec 26, 2023  1:11 PM Franky GRADE wrote: Patient is returning a call she received from [place], called CAL but Burnard was not available.

## 2023-12-29 ENCOUNTER — Encounter: Payer: Self-pay | Admitting: Psychiatry

## 2023-12-29 ENCOUNTER — Telehealth (INDEPENDENT_AMBULATORY_CARE_PROVIDER_SITE_OTHER): Admitting: Psychiatry

## 2023-12-29 DIAGNOSIS — F3342 Major depressive disorder, recurrent, in full remission: Secondary | ICD-10-CM

## 2023-12-29 DIAGNOSIS — F50811 Binge eating disorder, moderate: Secondary | ICD-10-CM | POA: Diagnosis not present

## 2023-12-29 DIAGNOSIS — F411 Generalized anxiety disorder: Secondary | ICD-10-CM

## 2023-12-29 MED ORDER — GABAPENTIN 100 MG PO CAPS: 400.0000 mg | ORAL_CAPSULE | Freq: Every day | ORAL | Status: DC

## 2023-12-29 NOTE — Progress Notes (Signed)
 Virtual Visit via Video Note  I connected with Barbara Russo on 12/29/23 at  9:20 AM EDT by a video enabled telemedicine application and verified that I am speaking with the correct person using two identifiers.  Location Provider Location : [facility] Patient Location : Home  Participants: Patient , Provider   I discussed the limitations of evaluation and management by telemedicine and the availability of in person appointments. The patient expressed understanding and agreed to proceed.   I discussed the assessment and treatment plan with the patient. The patient was provided an opportunity to ask questions and all were answered. The patient agreed with the plan and demonstrated an understanding of the instructions.   The patient was advised to call back or seek an in-person evaluation if the symptoms worsen or if the condition fails to improve as anticipated.    [facility] MD OP Progress Note  12/29/2023 9:40 AM Barbara Russo  MRN:  [id number]  Chief Complaint:  Chief Complaint  Patient presents with   Follow-up   Depression   Anxiety   Medication Refill   Discussed the use of AI scribe software for clinical note transcription with the patient, who gave verbal consent to proceed.  History of Present Illness Barbara Russo is a 63 year old female, single, employed, lives in [place], has a history of MDD, binge eating disorder, headaches, hyperlipidemia was evaluated by telemedicine today.  She reports that she occasionally feels down without a clear reason and describes herself as 'maintaining' and 'okay' with her depression and anxiety. She is grateful for her medications, which she feels help manage her symptoms.  Denies thoughts of self-harm or harm to others. Sleeping well.  She is currently reducing her gabapentin dosage, which she has been taking since her mid-thirties for headaches. She is now at 300 mg at night and 100 mg in the morning, noting a slight increase in  energy with the reduction. She underwent a nerve ablation in her neck two days ago and is hopeful it will help with her headaches. She feels 'out of sorts' when reducing gabapentin but does not experience pain.  She has gained weight, which she dislikes, and attributes some of her energy issues to adjusting to the climate in [place] , where she has lived for six years. She finds it difficult to motivate herself to exercise in the heat. 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 She has a dog, which she finds helpful for maintaining some physical activity.   Visit Diagnosis:    ICD-10-CM   1. MDD (major depressive disorder), recurrent, in full remission (HCC)  F33.42 gabapentin (NEURONTIN) 100 MG capsule    2. Moderate binge-eating disorder  F50.811     3. GAD (generalized anxiety disorder)  F41.1 gabapentin (NEURONTIN) 100 MG capsule      Past Psychiatric History: I have reviewed past psychiatric history from progress note on 01/30/2018.  Past trials of BuSpar , Valium , Wellbutrin , Ritalin, Depakote, Vyvanse .  Past Medical History:  Past Medical History:  Diagnosis Date   Actinic keratosis    Anxiety    Depression    Dysplastic nevus 10/03/2019   L lower back paraspinal mod to severe (shave removal)   Dysplastic nevus 09/23/2020   R lat breast - moderate   Dysplastic nevus 09/29/2021   Left Upper Back 5.5cm lat to spine, medial to mid scapula, moderate atypia   Dysplastic nevus 10/05/2023   left mid back at braline 9 cm lat to spine -  moderate   Dysplastic nevus 10/05/2023   right  mid back at braline 4.5 cm lat to spine - moderate   Dysplastic nevus 10/05/2023   left mid back above braline 6 cm lat to spine - moderate   Squamous cell carcinoma of skin 09/26/2019   SCCIS - L prox lat pretibial     Past Surgical History:  Procedure Laterality Date   BREAST BIOPSY Left 2008   neg    Family Psychiatric History: I have reviewed family psychiatric  history from progress note on 01/30/2018.  Family History:  Family History  Adopted: Yes    Social History: I have reviewed social history from progress note on 01/30/2018. Social History   Socioeconomic History   Marital status: Single    Spouse name: Not on file   Number of children: 0   Years of education: Not on file   Highest education level: Bachelor's degree (e.g., BA, AB, BS)  Occupational History   Not on file  Tobacco Use   Smoking status: Never   Smokeless tobacco: Never  Vaping Use   Vaping status: Never Used  Substance and Sexual Activity   Alcohol use: Yes    Alcohol/week: 2.0 standard drinks of alcohol    Types: 2 Cans of beer per week   Drug use: Never   Sexual activity: Not Currently  Other Topics Concern   Not on file  Social History Narrative   Not on file   Social Drivers of Health   Financial Resource Strain: Low Risk  (06/28/2023)   Overall Financial Resource Strain (CARDIA)    Difficulty of Paying Living Expenses: Not hard at all  Food Insecurity: No Food Insecurity (06/28/2023)   Hunger Vital Sign    Worried About Running Out of Food in the Last Year: Never true    Ran Out of Food in the Last Year: Never true  Transportation Needs: No Transportation Needs (06/28/2023)   PRAPARE - Administrator, Civil Service (Medical): No    Lack of Transportation (Non-Medical): No  Physical Activity: Unknown (06/28/2023)   Exercise Vital Sign    Days of Exercise per Week: 0 days    Minutes of Exercise per Session: Not on file  Stress: No Stress Concern Present (06/28/2023)   [employer] of Occupational Health - Occupational Stress Questionnaire    Feeling of Stress : Not at all  Social Connections: Moderately Isolated (06/28/2023)   Social Connection and Isolation Panel    Frequency of Communication with Friends and Family: Twice a week    Frequency of Social Gatherings with Friends and Family: Once a week    Attends Religious Services: 1 to  4 times per year    Active Member of [employer] or Organizations: No    Attends Engineer, structural: Not on file    Marital Status: Never married    Allergies: No Known Allergies  Metabolic Disorder Labs: Lab Results  Component Value Date   HGBA1C 5.6 06/16/2023   No results found for: PROLACTIN Lab Results  Component Value Date   CHOL 213 (H) 06/16/2023   TRIG 101.0 06/16/2023   HDL 81.90 06/16/2023   CHOLHDL 3 06/16/2023   VLDL 20.2 06/16/2023   LDLCALC 111 (H) 06/16/2023   Lab Results  Component Value Date   TSH 1.35 10/23/2023    Therapeutic Level Labs: No results found for: LITHIUM No results found for: VALPROATE No results found for: CBMZ  Current Medications: Current Outpatient Medications  Medication Sig Dispense Refill   amoxicillin (AMOXIL) 875 MG tablet Take 875 mg by mouth 2 (two) times daily.     botulinum toxin Type A (BOTOX) 200 units injection Inject into the muscle.     buPROPion  (WELLBUTRIN  XL) 300 MG 24 hr tablet TAKE 1 TABLET(300 MG) BY MOUTH DAILY 90 tablet 1   busPIRone  (BUSPAR ) 15 MG tablet TAKE 1 TABLET BY MOUTH EVERY MORNING, AT NOON, EVERY EVENING AND EVERY NIGHT AT BEDTIME 360 tablet 1   butalbital-acetaminophen-caffeine (FIORICET) 50-325-40 MG tablet Take by mouth.     cetirizine (ZYRTEC) 10 MG tablet Take by mouth 2 (two) times daily.     famotidine (PEPCID) 40 MG tablet Take 1 tablet by mouth 2 (two) times daily.     fluticasone  (FLONASE ) 50 MCG/ACT nasal spray Place 2 sprays into both nostrils daily. 1 g 11   gabapentin (NEURONTIN) 100 MG capsule Take 4 capsules (400 mg total) by mouth daily. Per neurology     gabapentin (NEURONTIN) 300 MG capsule Take 1 capsule by mouth 2 (two) times daily.     hydrocortisone 2.5 % cream Apply 1 Application topically as needed.     mometasone (ELOCON) 0.1 % cream Apply 1 Application topically daily.     REYVOW 100 MG TABS Take 1 tablet by mouth as needed.     simvastatin  (ZOCOR ) 40 MG  tablet Take 1 tablet (40 mg total) by mouth daily. 90 tablet 3   tirzepatide  (ZEPBOUND ) 2.5 MG/0.5ML injection vial Inject 2.5 mg into the skin once a week. 2 mL 0   topiramate (TOPAMAX) 100 MG tablet Take 100 mg by mouth 2 (two) times daily.     traMADol (ULTRAM) 50 MG tablet Take by mouth.     valACYclovir  (VALTREX ) 500 MG tablet TAKE 1 TABLET BY MOUTH TWICE DAILY FOR 7 DAYS. START 1 DAY BEFORE PROCEDURE     Vitamin D , Ergocalciferol , (DRISDOL ) 1.25 MG (50000 UNIT) CAPS capsule Take 1 capsule (50,000 Units total) by mouth every 7 (seven) days. 12 capsule 1   No current facility-administered medications for this visit.     Musculoskeletal: Strength & Muscle Tone: UTA Gait & Station: Seated Patient leans: N/A  Psychiatric Specialty Exam: Review of Systems  Psychiatric/Behavioral:  The patient is nervous/anxious.     There were no vitals taken for this visit.There is no height or weight on file to calculate BMI.  General Appearance: Casual  Eye Contact:  Fair  Speech:  Normal Rate  Volume:  Normal  Mood:  Anxious  Affect:  Congruent  Thought Process:  Goal Directed and Descriptions of Associations: Intact  Orientation:  Full (Time, Place, and Person)  Thought Content: Logical   Suicidal Thoughts:  No  Homicidal Thoughts:  No  Memory:  Immediate;   Fair Recent;   Fair Remote;   Fair  Judgement:  Fair  Insight:  Fair  Psychomotor Activity:  Normal  Concentration:  Concentration: Fair and Attention Span: Fair  Recall:  [employer] of Knowledge: Fair  Language: Fair  Akathisia:  No  Handed:  Right  AIMS (if indicated): not done  Assets:  Communication Skills Desire for Improvement Housing Social Support  ADL's:  Intact  Cognition: WNL  Sleep:  Fair   Screenings: Geneticist, molecular Office Visit from [facility]/20/2023 in [facility]  AIMS Total Score 0   AUDIT    Flowsheet Row Office Visit from 06/29/2023 in [facility]  at [employer] from 06/20/2023 in [facility] at [employer]  Alcohol Use Disorder Identification Test Final Score (AUDIT) 3  3    GAD-7    Flowsheet Row Office Visit from 10/23/2023 in [facility] [employer] Visit from 07/11/2023 in [facility] [employer] Visit from 06/29/2023 in [facility] [employer] Visit from 04/20/2023 in [facility] Office Visit from 03/21/2023 in [facility]  Total GAD-7 Score 2 2 1 2 2    PHQ2-9    Flowsheet Row Office Visit from 10/23/2023 in [facility] [employer] Visit from 07/11/2023 in [facility] [employer] Visit from 06/29/2023 in [facility] [employer] Visit from 04/20/2023 in [facility] Office Visit from 03/21/2023 in [facility] [employer] [facility]  PHQ-2 Total Score 2 2 1  0 2  PHQ-9 Total Score 5 5 4  -- 7   Flowsheet Row Video Visit from 12/29/2023 in [facility] Office Visit from 07/20/2023 in [facility] Office Visit from 04/20/2023 in [facility]  C-SSRS RISK CATEGORY No Risk No Risk No Risk     Assessment and Plan: Barbara Russo is a 63 year old Caucasian female, single, lives in [place], has a history of depression, binge eating disorder, chronic headache was evaluated by telemedicine today.  Discussed assessment and plan as noted below.  Major depression in remission Generalized anxiety disorder-stable Currently denies any significant mood symptoms and is managing well. Continue Wellbutrin  XL 300 mg daily Continue BuSpar   15 mg 4 times daily  Binge eating disorder in remission Currently denies any significant binge eating problems.  Is concerned about weight gain although is motivated to start working on lifestyle modification. Encouraged mindful eating practices. Encouraged to start walking, other exercise activities.   Follow-up Follow-up in clinic in 2 months or sooner if needed.   Consent: Patient/Guardian gives verbal consent for treatment and assignment of benefits for services provided during this visit. Patient/Guardian expressed understanding and agreed to proceed.   This note was generated in part or whole with voice recognition software. Voice recognition is usually quite accurate but there are transcription errors that can and very often do occur. I apologize for any typographical errors that were not detected and corrected.    Terreon Ekholm, MD 12/29/2023, 9:40 AM

## 2024-01-12 ENCOUNTER — Other Ambulatory Visit: Payer: Self-pay

## 2024-01-12 DIAGNOSIS — R635 Abnormal weight gain: Secondary | ICD-10-CM

## 2024-01-12 MED ORDER — TIRZEPATIDE-WEIGHT MANAGEMENT 2.5 MG/0.5ML ~~LOC~~ SOLN: 2.5000 mg | SUBCUTANEOUS | 2 refills | Status: DC

## 2024-01-12 NOTE — Telephone Encounter (Signed)
 Copied from CRM #[id number]. Topic: Clinical - Medication Refill >> Jan 12, 2024 10:31 AM Macario HERO wrote: Medication: tirzepatide  (ZEPBOUND ) 2.5 MG/0.5ML injection vial [[id number]]  Has the patient contacted their pharmacy? No (Agent: If no, request that the patient contact the pharmacy for the refill. If patient does not wish to contact the pharmacy document the reason why and proceed with request.) (Agent: If yes, when and what did the pharmacy advise?)  This is the patient's preferred pharmacy:   [facility] [place], [place] - [id number] [place] [id number] Phone: [phone] Fax: [phone]  Is this the correct pharmacy for this prescription? Yes If no, delete pharmacy and type the correct one.   Has the prescription been filled recently? Yes  Is the patient out of the medication? No  Has the patient been seen for an appointment in the last year OR does the patient have an upcoming appointment? Yes  Can we respond through MyChart? Yes  Agent: Please be advised that Rx refills may take up to 3 business days. We ask that you follow-up with your pharmacy.

## 2024-02-05 ENCOUNTER — Telehealth: Payer: Self-pay

## 2024-02-05 NOTE — Telephone Encounter (Signed)
 Copied from CRM #[id number]. Topic: Clinical - Request for Lab/Test Order >> Feb 05, 2024 10:11 AM Barbara Russo wrote: Reason for CRM: Patient was wondering if Barbara Russo would be able to send basic labs prior to her transfer of care appointment in January.   The patient has also been seen at orthopedics and she explained that she has broken multiple bones in the span of 6 years. Bones have been broken at random moments of stepping down from something lightly. She is concerned about the strength in her bones and would like if a bone density test could be done.   Patient is aware of the same day turn around time and will be looking out for an update on these request.

## 2024-02-05 NOTE — Telephone Encounter (Signed)
 Since this will be her first visit with me I will get labs after I see the patient.   Luke Shade, MD

## 2024-02-05 NOTE — Telephone Encounter (Signed)
 Copied from CRM #[id number]. Topic: General - Other >> Feb 05, 2024 10:18 AM [place] J wrote: Reason for CRM: Patient is wondering if the clinic can do pap smears or if a referral to a gynecologist needed to be created. She is schedule for transfer of care with Dr Abbey in January and was a former patient of Dr Hope.

## 2024-02-06 NOTE — Telephone Encounter (Signed)
 Spoke with patient to make her aware of Dr Graylon recommendations. Patient verbalized understanding and has no further questions.

## 2024-03-08 ENCOUNTER — Encounter: Payer: Self-pay | Admitting: Psychiatry

## 2024-03-08 ENCOUNTER — Telehealth: Admitting: Psychiatry

## 2024-03-08 DIAGNOSIS — F50811 Binge eating disorder, moderate: Secondary | ICD-10-CM | POA: Diagnosis not present

## 2024-03-08 DIAGNOSIS — F411 Generalized anxiety disorder: Secondary | ICD-10-CM

## 2024-03-08 DIAGNOSIS — F331 Major depressive disorder, recurrent, moderate: Secondary | ICD-10-CM

## 2024-03-08 MED ORDER — BUSPIRONE HCL 15 MG PO TABS: 15.0000 mg | ORAL_TABLET | Freq: Four times a day (QID) | ORAL | 1 refills | Status: DC

## 2024-03-08 MED ORDER — AUVELITY 45-105 MG PO TBCR: 1.0000 | EXTENDED_RELEASE_TABLET | ORAL | 0 refills | Status: DC

## 2024-03-08 NOTE — Patient Instructions (Signed)
 Dextromethorphan; Bupropion Extended-Release Tablets What is this medication? DEXTROMETHORPHAN; BUPROPION (dex troe meth OR fan; byoo PROE pee on) treats depression. It works by balancing substances in your brain that help regulate mood. It is a combination of dextromethorphan and an NDRI. This medicine may be used for other purposes; ask your health care provider or pharmacist if you have questions. COMMON BRAND NAME(S): AUVELITY What should I tell my care team before I take this medication? They need to know if you have any of these conditions: An eating disorder, such as anorexia or bulimia Bipolar disorder Brain or spine tumor Diabetes treated with medications Frequently drink alcohol Glaucoma Heart disease, previous heart attack, or irregular heart beat Head injury High blood pressure History of substance use disorder Kidney disease Liver disease Low blood sugar Low levels of sodium in the blood Schizophrenia Seizures Stroke Suicidal thoughts, plans, or attempt by you or a family member Weight loss An unusual or allergic reaction to bupropion, dextromethorphan, other medications, foods, dyes, or preservatives Pregnant or trying to become pregnant Breastfeeding How should I use this medication? Take this medication by mouth with a glass of water. Follow the directions on the prescription label. Do not cut, crush, or chew this medication. Swallow the tablets whole. You can take this medication with or without food. If it upsets your stomach, take it with food. Take your medication at regular intervals. Do not take it more often than directed. Do not stop taking this medication except on your care team's advice. Stopping this medication too quickly may cause serious side effects or worsen your condition. A special MedGuide will be given to you by the pharmacist with each prescription and refill. Be sure to read this information carefully each time. Talk to your care team about the  use of this medication in children. Special care may be needed. Overdosage: If you think you have taken too much of this medicine contact a poison control center or emergency room at once. NOTE: This medicine is only for you. Do not share this medicine with others. What if I miss a dose? If you miss a dose, skip it. Take your next dose at the normal time. Do not take extra or 2 doses at the same time to make up for the missed dose. What may interact with this medication? Do not take this medication with any of the following: Linezolid MAOIs, such as Azilect, Carbex, Eldepryl, Marplan, Nardil, and Parnate Methylene blue (injected into a vein) Other medications that contain bupropion, such as Wellbutrin or Zyban Other medications that contain dextromethorphan, such as Robitussin or Delsym This medication may also interact with the following: Alcohol Certain medications for anxiety or sleep Certain medications for blood pressure, such as metoprolol or propranolol Certain medications for depression or other mental health conditions Certain medications for HIV or AIDS, such as efavirenz, lopinavir, nelfinavir, ritonavir Certain medications for irregular heartbeat, such as propafenone or flecainide Certain medications for Parkinson disease, such as amantadine or levodopa Certain medications for seizures, such as carbamazepine, phenytoin, phenobarbital Cimetidine Clopidogrel Cyclophosphamide Digoxin Furazolidone Isoniazid Nicotine Orphenadrine Procarbazine Steroid medications, such as prednisone or cortisone Stimulant medications for attention disorders, weight loss, or to stay awake Tamoxifen Theophylline Thiotepa Ticlopidine Tramadol Warfarin This list may not describe all possible interactions. Give your health care provider a list of all the medicines, herbs, non-prescription drugs, or dietary supplements you use. Also tell them if you smoke, drink alcohol, or use illegal drugs.  Some items may interact with your medicine.  What should I watch for while using this medication? Visit your care team for regular checks on your progress. Tell your care team if your symptoms do not get better or if they get worse. Because it may take several weeks to see the full effects of this medication, it is important to continue your treatment as prescribed. Watch for new or worsening thoughts of suicide or depression. This includes sudden changes in mood, behaviors, or thoughts. These changes can happen at any time but are more common in the beginning of treatment or after a change in dose. Call your care team right away if you experience these thoughts or worsening depression. Manic episodes may happen in patients with bipolar disorder who take this medication. Watch for changes in feelings or behaviors such as feeling anxious, nervous, agitated, panicky, irritable, hostile, aggressive, impulsive, severely restless, overly excited and hyperactive, or trouble sleeping. These symptoms can happen at anytime but are more common in the beginning of treatment or after a change in dose. Call your care team right away if you notice any of these symptoms. This medication may cause serious skin reactions. They can happen weeks to months after starting the medication. Contact your care team right away if you notice fevers or flu-like symptoms with a rash. The rash may be red or purple and then turn into blisters or peeling of the skin. Or, you might notice a red rash with swelling of the face, lips or lymph nodes in your neck or under your arms. Avoid drinks that contain alcohol while taking this medication. Drinking large amounts of alcohol, using sleeping or anxiety medications, or quickly stopping the use of these agents while taking this medication may increase your risk for a seizure. Do not drive or use heavy machinery until you know how this medication affects you. This medication can impair your ability  to perform these tasks. Your mouth may get dry. Chewing sugarless gum or sucking hard candy and drinking plenty of water may help. Contact your care team if the problem does not go away or is severe. What side effects may I notice from receiving this medication? Side effects that you should report to your care team as soon as possible: Allergic reactions--skin rash, itching, hives, swelling of the face, lips, tongue, or throat Increase in blood pressure Mood and behavior changes--anxiety, nervousness, confusion, hallucinations, irritability, hostility, thoughts of suicide or self-harm, worsening mood, feelings of depression Redness, blistering, peeling, or loosening of the skin, including inside the mouth Seizures Sudden eye pain or change in vision such as blurry vision, seeing halos around lights, vision loss Side effects that usually do not require medical attention (report these to your care team if they continue or are bothersome): Change in sex drive or performance Diarrhea Dizziness Drowsiness Dry mouth Excessive sweating Headache This list may not describe all possible side effects. Call your doctor for medical advice about side effects. You may report side effects to FDA at 1-800-FDA-1088. Where should I keep my medication? Keep out of the reach of children and pets. Store between 20 and 25 degrees C (68 and 77 degrees F). Keep this medication in the original container until you are ready to take it. Get rid of any unused medication after the expiration date. To get rid of medications that are no longer needed or have expired: Take the medication to a medication take-back program. Check with your pharmacy or law enforcement to find a location. If you cannot return the medication, check the  label or package insert to see if the medication should be thrown out in the garbage or flushed down the toilet. If you are not sure, ask your care team. If it is safe to put it in the trash, take  the medication out of the container. Mix the medication with cat litter, dirt, coffee grounds, or other unwanted substance. Seal the mixture in a bag or container. Put it in the trash. NOTE: This sheet is a summary. It may not cover all possible information. If you have questions about this medicine, talk to your doctor, pharmacist, or health care provider.  2024 Elsevier/Gold Standard (2022-11-30 00:00:00)

## 2024-03-08 NOTE — Progress Notes (Signed)
 Virtual Visit via Video Note  I connected with Barbara Russo on 03/08/24 at  9:40 AM EDT by a video enabled telemedicine application and verified that I am speaking with the correct person using two identifiers.  Location Provider Location : [facility] Patient Location : Home  Participants: Patient , Provider    I discussed the limitations of evaluation and management by telemedicine and the availability of in person appointments. The patient expressed understanding and agreed to proceed.   I discussed the assessment and treatment plan with the patient. The patient was provided an opportunity to ask questions and all were answered. The patient agreed with the plan and demonstrated an understanding of the instructions.   The patient was advised to call back or seek an in-person evaluation if the symptoms worsen or if the condition fails to improve as anticipated.   BH MD OP Progress Note  03/08/2024 10:08 AM Barbara Russo  MRN:  [id number]  Chief Complaint:  Chief Complaint  Patient presents with   Follow-up   Anxiety   Depression   Medication Refill   Discussed the use of AI scribe software for clinical note transcription with the patient, who gave verbal consent to proceed.  History of Present Illness Barbara Russo is a 63 year old female, single, employed, lives in [place], has a history of MDD, binge eating disorder, headaches, hyperlipidemia was evaluated by telemedicine today.  Over the past several months, she has noticed a worsening of her depression. She reports a persistent lack of excitement or anticipation for life events and no longer looks forward to activities or social engagements as she once did. She wakes up without enthusiasm for the day and feels that nothing brings her particular happiness. She identifies a significant decrease in interest and pleasure in activities and feels her symptoms are more related to depression than anxiety. Despite these  symptoms, she remains fully functional in her daily life, including work, caring for her animals, and maintaining her household, but continues to experience low mood and diminished enjoyment. She denies crying frequently but acknowledges feeling emotional during the visit.  Regarding anxiety, she describes her symptoms as stable and does not find them currently problematic. Work-related worries occur, but she does not consider these excessive or impairing. She recalls a prior period of increased anxiety in 2022, but currently identifies depression as her primary concern.  She denies any current or recent binge eating behaviors and reports no changes in appetite. She states that she would not return to previous patterns of binge eating.  She denies any suicidality, homicidality or perceptual disturbances.  Her current medications include Wellbutrin  300 mg and Buspar  15 mg four times daily (taken as 2 in the morning and 2 in the evening). She also takes [place] for headaches at night and recently switched from gabapentin  to Lyrica (pregabalin). She discusses her medication regimen and expresses concern about medication changes, noting a long history with Wellbutrin . She reports that her headaches have improved following a nerve ablation and that she previously thought her headaches were making her feel down.  She previously received a referral for therapy and completed some sessions with a therapist named Tawni. She states uncertainty about therapy and has not engaged in ongoing therapy.     Visit Diagnosis:    ICD-10-CM   1. Moderate episode of recurrent major depressive disorder (HCC)  F33.1 Dextromethorphan-buPROPion  ER (AUVELITY ) 45-105 MG TBCR    2. Moderate binge-eating disorder  F50.811    in remission    3.  GAD (generalized anxiety disorder)  F41.1 busPIRone  (BUSPAR ) 15 MG tablet      Past Psychiatric History: I have reviewed past psychiatric history from progress note on  01/30/2018.  Past trials of BuSpar , Valium , Wellbutrin , Ritalin, Depakote, Vyvanse .  Past Medical History:  Past Medical History:  Diagnosis Date   Actinic keratosis    Anxiety    Depression    Dysplastic nevus 10/03/2019   L lower back paraspinal mod to severe (shave removal)   Dysplastic nevus 09/23/2020   R lat breast - moderate   Dysplastic nevus 09/29/2021   Left Upper Back 5.5cm lat to spine, medial to mid scapula, moderate atypia   Dysplastic nevus 10/05/2023   left mid back at braline 9 cm lat to spine - moderate   Dysplastic nevus 10/05/2023   right  mid back at braline 4.5 cm lat to spine - moderate   Dysplastic nevus 10/05/2023   left mid back above braline 6 cm lat to spine - moderate   Squamous cell carcinoma of skin 09/26/2019   SCCIS - L prox lat pretibial     Past Surgical History:  Procedure Laterality Date   BREAST BIOPSY Left 2008   neg    Family Psychiatric History: I have reviewed family psychiatric history from progress note on 01/30/2018.  Family History:  Family History  Adopted: Yes    Social History: I have reviewed social history from progress note on 01/30/2018. Social History   Socioeconomic History   Marital status: Single    Spouse name: Not on file   Number of children: 0   Years of education: Not on file   Highest education level: Bachelor's degree (e.g., BA, AB, BS)  Occupational History   Not on file  Tobacco Use   Smoking status: Never   Smokeless tobacco: Never  Vaping Use   Vaping status: Never Used  Substance and Sexual Activity   Alcohol use: Yes    Alcohol/week: 2.0 standard drinks of alcohol    Types: 2 Cans of beer per week   Drug use: Never   Sexual activity: Not Currently  Other Topics Concern   Not on file  Social History Narrative   Not on file   Social Drivers of Health   Financial Resource Strain: Low Risk  (06/28/2023)   Overall Financial Resource Strain (CARDIA)    Difficulty of Paying Living Expenses:  Not hard at all  Food Insecurity: No Food Insecurity (06/28/2023)   Hunger Vital Sign    Worried About Running Out of Food in the Last Year: Never true    Ran Out of Food in the Last Year: Never true  Transportation Needs: No Transportation Needs (06/28/2023)   PRAPARE - Administrator, Civil Service (Medical): No    Lack of Transportation (Non-Medical): No  Physical Activity: Unknown (06/28/2023)   Exercise Vital Sign    Days of Exercise per Week: 0 days    Minutes of Exercise per Session: Not on file  Stress: No Stress Concern Present (06/28/2023)   [employer] of Occupational Health - Occupational Stress Questionnaire    Feeling of Stress : Not at all  Social Connections: Moderately Isolated (06/28/2023)   Social Connection and Isolation Panel    Frequency of Communication with Friends and Family: Twice a week    Frequency of Social Gatherings with Friends and Family: Once a week    Attends Religious Services: 1 to 4 times per year    Active Member of  Clubs or Organizations: No    Attends Engineer, structural: Not on file    Marital Status: Never married    Allergies: No Known Allergies  Metabolic Disorder Labs: Lab Results  Component Value Date   HGBA1C 5.6 06/16/2023   No results found for: PROLACTIN Lab Results  Component Value Date   CHOL 213 (H) 06/16/2023   TRIG 101.0 06/16/2023   HDL 81.90 06/16/2023   CHOLHDL 3 06/16/2023   VLDL 20.2 06/16/2023   LDLCALC 111 (H) 06/16/2023   Lab Results  Component Value Date   TSH 1.35 10/23/2023    Therapeutic Level Labs: No results found for: LITHIUM No results found for: VALPROATE No results found for: CBMZ  Current Medications: Current Outpatient Medications  Medication Sig Dispense Refill   Dextromethorphan-buPROPion  ER (AUVELITY ) 45-105 MG TBCR Take 1-2 tablets by mouth as directed. Take 1 tablet daily for 3 days and increase to 1 tablet twice daily.( Stop wellbutrin ) 57 tablet 0    pregabalin (LYRICA) 75 MG capsule 1 tab in AM and 1 tab 1 hours before bedtime     amoxicillin (AMOXIL) 875 MG tablet Take 875 mg by mouth 2 (two) times daily.     botulinum toxin Type A (BOTOX) 200 units injection Inject into the muscle.     busPIRone  (BUSPAR ) 15 MG tablet Take 1 tablet (15 mg total) by mouth QID. 360 tablet 1   butalbital-acetaminophen-caffeine (FIORICET) 50-325-40 MG tablet Take by mouth.     cetirizine (ZYRTEC) 10 MG tablet Take by mouth 2 (two) times daily.     famotidine (PEPCID) 40 MG tablet Take 1 tablet by mouth 2 (two) times daily.     fluticasone  (FLONASE ) 50 MCG/ACT nasal spray Place 2 sprays into both nostrils daily. 1 g 11   mometasone (ELOCON) 0.1 % cream Apply 1 Application topically daily.     QULIPTA 60 MG TABS Take 1 tablet by mouth daily.     REYVOW 100 MG TABS Take 1 tablet by mouth as needed.     simvastatin  (ZOCOR ) 40 MG tablet Take 1 tablet (40 mg total) by mouth daily. 90 tablet 3   tirzepatide  (ZEPBOUND ) 2.5 MG/0.5ML injection vial Inject 2.5 mg into the skin once a week. 2 mL 2   topiramate (TOPAMAX) 100 MG tablet Take 100 mg by mouth 2 (two) times daily.     traMADol (ULTRAM) 50 MG tablet Take by mouth.     valACYclovir  (VALTREX ) 500 MG tablet TAKE 1 TABLET BY MOUTH TWICE DAILY FOR 7 DAYS. START 1 DAY BEFORE PROCEDURE     Vitamin D , Ergocalciferol , (DRISDOL ) 1.25 MG (50000 UNIT) CAPS capsule Take 1 capsule (50,000 Units total) by mouth every 7 (seven) days. 12 capsule 1   No current facility-administered medications for this visit.     Musculoskeletal: Strength & Muscle Tone: UTA Gait & Station: Seated Patient leans: N/A  Psychiatric Specialty Exam: Review of Systems  Psychiatric/Behavioral:  Positive for dysphoric mood.     There were no vitals taken for this visit.There is no height or weight on file to calculate BMI.  General Appearance: Casual  Eye Contact:  Fair  Speech:  Normal Rate  Volume:  Normal  Mood:  Depressed  Affect:   Congruent  Thought Process:  Goal Directed and Descriptions of Associations: Intact  Orientation:  Full (Time, Place, and Person)  Thought Content: Logical   Suicidal Thoughts:  No  Homicidal Thoughts:  No  Memory:  Immediate;   Fair Recent;  Fair Remote;   Fair  Judgement:  Fair  Insight:  Fair  Psychomotor Activity:  Normal  Concentration:  Concentration: Fair and Attention Span: Fair  Recall:  [employer] of Knowledge: Fair  Language: Fair  Akathisia:  No  Handed:  Right  AIMS (if indicated): not done  Assets:  Manufacturing systems engineer Desire for Improvement Housing Social Support Transportation  ADL's:  Intact  Cognition: WNL  Sleep:  Fair   Screenings: Geneticist, molecular Office Visit from [facility]/20/2023 in [facility]  AIMS Total Score 0   AUDIT    Flowsheet Row Office Visit from 06/29/2023 in [facility] [employer] from 06/20/2023 in [facility] [employer]  Alcohol Use Disorder Identification Test Final Score (AUDIT) 3  3    GAD-7    Flowsheet Row Office Visit from 10/23/2023 in [facility] [employer] Visit from 07/11/2023 in [facility] [employer] Visit from 06/29/2023 in [facility] [employer] Visit from 04/20/2023 in [facility] Office Visit from 03/21/2023 in [facility] [employer]  Total GAD-7 Score 2 2 1 2 2    PHQ2-9    Flowsheet Row Office Visit from 10/23/2023 in [facility] [employer] Visit from 07/11/2023 in [facility] [employer] Visit from 06/29/2023 in [facility] [employer] Visit from 04/20/2023 in [facility] Office Visit from  03/21/2023 in [facility] Total Score 2 2 1  0 2  PHQ-9 Total Score 5 5 4  -- 7   Flowsheet Row Video Visit from 03/08/2024 in [facility] Video Visit from 12/29/2023 in [facility] Office Visit from 07/20/2023 in [facility]  C-SSRS RISK CATEGORY No Risk No Risk No Risk     Assessment and Plan: Barbara Russo is a 63 year old Caucasian female with history of depression, anxiety, binge eating disorder presented for a follow-up appointment.  Discussed assessment and plan as noted below.  1. Moderate episode of recurrent major depressive disorder (HCC) Currently notes depression symptoms, crying spells.  Agreeable to change to Auvelity . Start Auvelity  45 mg - 105 mg once a day for 3 days and increase to twice a day. Discontinue Wellbutrin . Continue BuSpar  15 mg 4 times daily Encouraged to establish care with therapist-provided resources  2. Moderate binge-eating disorder in remission Currently denies any significant binge eating problems. Encouraged mindful eating practices.  3. GAD (generalized anxiety disorder)-stable Denies any significant anxiety symptoms Continue BuSpar  15 mg 4 times a day  Follow-up Follow-up in clinic in 1 month or sooner in person.    Collaboration of Care: Collaboration of Care: Referral or follow-up with counselor/therapist AEB encouraged to establish care with therapist.  Patient/Guardian was advised Release of Information must be obtained prior to any record release in order to collaborate their care with an outside provider. Patient/Guardian was advised if they have not already done so to contact the registration department to sign all necessary forms in order for us  to release information regarding their care.   Consent: Patient/Guardian gives verbal consent for treatment and assignment  of benefits for services provided during this visit. Patient/Guardian expressed understanding and agreed to proceed.  This note was generated in part or whole with voice recognition software. Voice recognition is usually quite accurate but there are transcription errors that can and very often do occur. I apologize for any typographical errors that were not detected and corrected.    Barbara Fonner, MD 03/10/2024, 6:46 AM

## 2024-03-11 ENCOUNTER — Telehealth: Payer: Self-pay

## 2024-03-11 NOTE — Telephone Encounter (Signed)
 Received fax requesting a PA for the patients Auvelity  45-105 mg ER tablets initiated via CoverMyMeds   Approved  Coverage 03-11-24///03-11-25  Patient made aware

## 2024-03-13 ENCOUNTER — Other Ambulatory Visit: Payer: Self-pay | Admitting: Psychiatry

## 2024-03-13 DIAGNOSIS — F3342 Major depressive disorder, recurrent, in full remission: Secondary | ICD-10-CM

## 2024-03-21 ENCOUNTER — Encounter

## 2024-03-21 DIAGNOSIS — E785 Hyperlipidemia, unspecified: Secondary | ICD-10-CM

## 2024-03-21 DIAGNOSIS — R7309 Other abnormal glucose: Secondary | ICD-10-CM

## 2024-03-21 DIAGNOSIS — F39 Unspecified mood [affective] disorder: Secondary | ICD-10-CM

## 2024-03-29 DIAGNOSIS — F411 Generalized anxiety disorder: Secondary | ICD-10-CM

## 2024-03-29 DIAGNOSIS — F331 Major depressive disorder, recurrent, moderate: Secondary | ICD-10-CM

## 2024-04-02 MED ORDER — BUSPIRONE HCL 15 MG PO TABS: 15.0000 mg | ORAL_TABLET | Freq: Four times a day (QID) | ORAL | 1 refills | Status: AC

## 2024-04-02 MED ORDER — AUVELITY 45-105 MG PO TBCR: 1.0000 | EXTENDED_RELEASE_TABLET | Freq: Two times a day (BID) | ORAL | 2 refills | Status: DC

## 2024-04-02 NOTE — Telephone Encounter (Signed)
 Contacted patient to verify her needs.  I have sent BuSpar  to [facility].  I have sent a new prescription for Auvelity  to [facility].

## 2024-04-02 NOTE — Telephone Encounter (Signed)
 Called [facility] spoke to [place] he stated that he would cancel the Buspar  sent out on 03-08-24

## 2024-04-09 ENCOUNTER — Ambulatory Visit: Admitting: Dermatology

## 2024-04-09 ENCOUNTER — Telehealth: Payer: Self-pay | Admitting: Psychiatry

## 2024-04-09 DIAGNOSIS — W908XXA Exposure to other nonionizing radiation, initial encounter: Secondary | ICD-10-CM | POA: Diagnosis not present

## 2024-04-09 DIAGNOSIS — Z1283 Encounter for screening for malignant neoplasm of skin: Secondary | ICD-10-CM | POA: Diagnosis not present

## 2024-04-09 DIAGNOSIS — L821 Other seborrheic keratosis: Secondary | ICD-10-CM

## 2024-04-09 DIAGNOSIS — D492 Neoplasm of unspecified behavior of bone, soft tissue, and skin: Secondary | ICD-10-CM

## 2024-04-09 DIAGNOSIS — D225 Melanocytic nevi of trunk: Secondary | ICD-10-CM | POA: Diagnosis not present

## 2024-04-09 DIAGNOSIS — L814 Other melanin hyperpigmentation: Secondary | ICD-10-CM

## 2024-04-09 DIAGNOSIS — L509 Urticaria, unspecified: Secondary | ICD-10-CM

## 2024-04-09 DIAGNOSIS — L578 Other skin changes due to chronic exposure to nonionizing radiation: Secondary | ICD-10-CM

## 2024-04-09 DIAGNOSIS — D489 Neoplasm of uncertain behavior, unspecified: Secondary | ICD-10-CM

## 2024-04-09 DIAGNOSIS — Z7189 Other specified counseling: Secondary | ICD-10-CM

## 2024-04-09 DIAGNOSIS — L820 Inflamed seborrheic keratosis: Secondary | ICD-10-CM | POA: Diagnosis not present

## 2024-04-09 DIAGNOSIS — D1801 Hemangioma of skin and subcutaneous tissue: Secondary | ICD-10-CM

## 2024-04-09 DIAGNOSIS — Z86007 Personal history of in-situ neoplasm of skin: Secondary | ICD-10-CM

## 2024-04-09 DIAGNOSIS — Z79899 Other long term (current) drug therapy: Secondary | ICD-10-CM

## 2024-04-09 DIAGNOSIS — D229 Melanocytic nevi, unspecified: Secondary | ICD-10-CM

## 2024-04-09 DIAGNOSIS — Z8589 Personal history of malignant neoplasm of other organs and systems: Secondary | ICD-10-CM

## 2024-04-09 DIAGNOSIS — Z86018 Personal history of other benign neoplasm: Secondary | ICD-10-CM

## 2024-04-09 NOTE — Telephone Encounter (Signed)
 Patient had to reschedule her appointment on 04-11-24 due to a work conflict. She will come  November 18. She wanted you to know the new medication you started her on is doing well so the November appointment will be fine and will call if any issues come up prior

## 2024-04-09 NOTE — Telephone Encounter (Signed)
 Noted

## 2024-04-09 NOTE — Progress Notes (Signed)
 Follow-Up Visit   Subjective  Barbara Russo is a 63 y.o. female who presents for the following: Skin Cancer Screening and Full Body Skin Exam Hx of scc, hx of dysplastic nevi, hx of aks and isk Hx of dermatographism reports flared and would like to discuss treatment. Up at night itching on arms, chest, and under arms with welps.   The patient presents for Total-Body Skin Exam (TBSE) for skin cancer screening and mole check. The patient has spots, moles and lesions to be evaluated, some may be new or changing and the patient may have concern these could be cancer.  The following portions of the chart were reviewed this encounter and updated as appropriate: medications, allergies, medical history  Review of Systems:  No other skin or systemic complaints except as noted in HPI or Assessment and Plan.  Objective  Well appearing patient in no apparent distress; mood and affect are within normal limits.  A full examination was performed including scalp, head, eyes, ears, nose, lips, neck, chest, axillae, abdomen, back, buttocks, bilateral upper extremities, bilateral lower extremities, hands, feet, fingers, toes, fingernails, and toenails. All findings within normal limits unless otherwise noted below.   Relevant physical exam findings are noted in the Assessment and Plan.  Left mandible preauricular x 1 , back x 3, chest and inframammary x 15, abdomen x 1 (20) Erythematous stuck-on, waxy papule or plaque left chest x 1 Erythematous stuck-on, waxy papule or plaque  right mid back at bra line 4.5 cm lateral to spine 0.4 cm recurrent brown macule    Assessment & Plan   HISTORY OF SQUAMOUS CELL CARCINOMA IN SITU OF THE SKIN 09/26/2019 Left proximal lateral pretibial  - No evidence of recurrence today - No lymphadenopathy - Recommend regular full body skin exams - Recommend daily broad spectrum sunscreen SPF 30+ to sun-exposed areas, reapply every 2 hours as needed.  - Call if any  new or changing lesions are noted between office visits   HISTORY OF DYSPLASTIC NEVUS 10/05/23 left mid back at bra line 9 cm lateral to spine moderate,  left mid back above bra line 6 cm lateral to spine moderate 09/29/2021 - left upper back 5.5 cm lateral to spine medial to mid scapula  moderate  09/23/2020 right lateral breast - moderate  No evidence of recurrence today Recommend regular full body skin exams Recommend daily broad spectrum sunscreen SPF 30+ to sun-exposed areas, reapply every 2 hours as needed.  Call if any new or changing lesions are noted between office visits   SKIN CANCER SCREENING PERFORMED TODAY.  ACTINIC DAMAGE - Chronic condition, secondary to cumulative UV/sun exposure - diffuse scaly erythematous macules with underlying dyspigmentation - Recommend daily broad spectrum sunscreen SPF 30+ to sun-exposed areas, reapply every 2 hours as needed.  - Staying in the shade or wearing long sleeves, sun glasses (UVA+UVB protection) and wide brim hats (4-inch brim around the entire circumference of the hat) are also recommended for sun protection.  - Call for new or changing lesions.  LENTIGINES, SEBORRHEIC KERATOSES, HEMANGIOMAS - Benign normal skin lesions - Benign-appearing - Call for any changes  MELANOCYTIC NEVI - Tan-brown and/or pink-flesh-colored symmetric macules and papules - Benign appearing on exam today - Observation - Call clinic for new or changing moles - Recommend daily use of broad spectrum spf 30+ sunscreen to sun-exposed areas.   DERMATOGRAPHISM / Urticaria Arms / chest / back Exam: pinkness at arms  A provocation test was preformed where dermatographism was noted  on left arm   Treatment Plan:  Recommend  For dermatographism or Itch: Start non sedating antihistamine (either Allegra 180mg , or Claritin 10mg , or Zyrtec 10mg ) daily.  All these are non-prescription (Over the Counter).   Start out with 1 pill a day.   After a week if not  improving may increase to 2 pills a day.   After another week if not improving may increase to 3 pills a day.   After another week if still not improving may take up to 4 pills a day. Stay at highest dose that keeps condition controlled, but only up to 4 pills a day. Stay at the controlling dose for at least 2 weeks. Contact office if taking 4 pills of antihistamine a day for at least 2 weeks without control of condition as other options may be available.   Discussed injections vs pill  Patient prefers to start pill treatment  Discussed common side of Rhapsido effect while taking medication is Petechiae  No Petechiae observed in patient's mouth at exam  Start Rhapsido 25 mg tab - take 1 by mouth twice daily with or without food Lot [id number] Exp 10/10/2025   Will submit medication to pharmacy Will recheck in 3 weeks   If still having trouble may consider other options such as Dupixent or Xolair   INFLAMED SEBORRHEIC KERATOSIS (21) Left mandible preauricular x 1 , back x 3, chest and inframammary x 15, abdomen x 1 (20), left chest x 1 Symptomatic, irritating, patient would like treated.  Patient advised to call if spot at left chest is not resolved in 2 months may need to biopsy  Destruction of lesion - Left mandible preauricular x 1 , back x 3, chest and inframammary x 15, abdomen x 1 (20), left chest x 1 Complexity: simple   Destruction method: cryotherapy   Informed consent: discussed and consent obtained   Timeout:  patient name, date of birth, surgical site, and procedure verified Lesion destroyed using liquid nitrogen: Yes   Region frozen until ice ball extended beyond lesion: Yes   Outcome: patient tolerated procedure well with no complications   Post-procedure details: wound care instructions given    NEOPLASM OF UNCERTAIN BEHAVIOR right mid back at bra line 4.5 cm lateral to spine Epidermal / dermal shaving  Lesion diameter (cm):  0.4 Informed consent: discussed and  consent obtained   Timeout: patient name, date of birth, surgical site, and procedure verified   Procedure prep:  Patient was prepped and draped in usual sterile fashion Prep type:  Isopropyl alcohol Anesthesia: the lesion was anesthetized in a standard fashion   Anesthetic:  1% lidocaine w/ epinephrine 1-100,000 buffered w/ 8.4% NaHCO3 Instrument used: flexible razor blade   Hemostasis achieved with: pressure, aluminum chloride and electrodesiccation   Outcome: patient tolerated procedure well   Post-procedure details: sterile dressing applied and wound care instructions given   Dressing type: bandage and petrolatum    Specimen 1 - Surgical pathology Differential Diagnosis: r/o recurrent dysplastic nevus  See previous pathology 09/15/2023 Accession: [id number] Check Margins: yes Return for 3 week dermatographism followup, 1 year tbse .  IEleanor Blush, CMA, am acting as scribe for Alm Rhyme, MD.   Documentation: I have reviewed the above documentation for accuracy and completeness, and I agree with the above.  Alm Rhyme, MD

## 2024-04-09 NOTE — Patient Instructions (Addendum)
 If spot at left chest has not resolved after 2 month call or send mychart will need to further treat  Start Rhapsido tablets 25 mg - take 2 tabs by mouth daily with or without food   For dermatographism or Itch: Start non sedating antihistamine (either Allegra 180mg , or Claritin 10mg , or Zyrtec 10mg ) daily.  All these are non-prescription (Over the Counter).   Start out with 1 pill a day.   After a week if not improving may increase to 2 pills a day.   After another week if not improving may increase to 3 pills a day.   After another week if still not improving may take up to 4 pills a day. Stay at highest dose that keeps condition controlled, but only up to 4 pills a day. Stay at the controlling dose for at least 2 weeks. Contact office if taking 4 pills of antihistamine a day for at least 2 weeks without control of condition as other options may be available.    Biopsy Wound Care Instructions  Leave the original bandage on for 24 hours if possible.  If the bandage becomes soaked or soiled before that time, it is OK to remove it and examine the wound.  A small amount of post-operative bleeding is normal.  If excessive bleeding occurs, remove the bandage, place gauze over the site and apply continuous pressure (no peeking) over the area for 30 minutes. If this does not work, please call our clinic as soon as possible or page your doctor if it is after hours.   Once a day, cleanse the wound with soap and water. It is fine to shower. If a thick crust develops you may use a Q-tip dipped into dilute hydrogen peroxide (mix 1:1 with water) to dissolve it.  Hydrogen peroxide can slow the healing process, so use it only as needed.    After washing, apply petroleum jelly (Vaseline) or an antibiotic ointment if your doctor prescribed one for you, followed by a bandage.    For best healing, the wound should be covered with a layer of ointment at all times. If you are not able to keep the area covered  with a bandage to hold the ointment in place, this may mean re-applying the ointment several times a day.  Continue this wound care until the wound has healed and is no longer open.   Itching and mild discomfort is normal during the healing process. However, if you develop pain or severe itching, please call our office.   If you have any discomfort, you can take Tylenol (acetaminophen) or ibuprofen as directed on the bottle. (Please do not take these if you have an allergy to them or cannot take them for another reason).  Some redness, tenderness and white or yellow material in the wound is normal healing.  If the area becomes very sore and red, or develops a thick yellow-green material (pus), it may be infected; please notify us .    If you have stitches, return to clinic as directed to have the stitches removed. You will continue wound care for 2-3 days after the stitches are removed.   Wound healing continues for up to one year following surgery. It is not unusual to experience pain in the scar from time to time during the interval.  If the pain becomes severe or the scar thickens, you should notify the office.    A slight amount of redness in a scar is expected for the first six months.  After six months, the redness will fade and the scar will soften and fade.  The color difference becomes less noticeable with time.  If there are any problems, return for a post-op surgery check at your earliest convenience.  To improve the appearance of the scar, you can use silicone scar gel, cream, or sheets (such as Mederma or Serica) every night for up to one year. These are available over the counter (without a prescription).  Please call our office at [phone] for any questions or concerns.     Seborrheic Keratosis  What causes seborrheic keratoses? Seborrheic keratoses are harmless, common skin growths that first appear during adult life.  As time goes by, more growths appear.  Some people may  develop a large number of them.  Seborrheic keratoses appear on both covered and uncovered body parts.  They are not caused by sunlight.  The tendency to develop seborrheic keratoses can be inherited.  They vary in color from skin-colored to gray, brown, or even black.  They can be either smooth or have a rough, warty surface.   Seborrheic keratoses are superficial and look as if they were stuck on the skin.  Under the microscope this type of keratosis looks like layers upon layers of skin.  That is why at times the top layer may seem to fall off, but the rest of the growth remains and re-grows.    Treatment Seborrheic keratoses do not need to be treated, but can easily be removed in the office.  Seborrheic keratoses often cause symptoms when they rub on clothing or jewelry.  Lesions can be in the way of shaving.  If they become inflamed, they can cause itching, soreness, or burning.  Removal of a seborrheic keratosis can be accomplished by freezing, burning, or surgery. If any spot bleeds, scabs, or grows rapidly, please return to have it checked, as these can be an indication of a skin cancer.   Cryotherapy Aftercare  Wash gently with soap and water everyday.   Apply Vaseline and Band-Aid daily until healed.      Melanoma ABCDEs  Melanoma is the most dangerous type of skin cancer, and is the leading cause of death from skin disease.  You are more likely to develop melanoma if you: Have light-colored skin, light-colored eyes, or red or blond hair Spend a lot of time in the sun Tan regularly, either outdoors or in a tanning bed Have had blistering sunburns, especially during childhood Have a close family member who has had a melanoma Have atypical moles or large birthmarks  Early detection of melanoma is key since treatment is typically straightforward and cure rates are extremely high if we catch it early.   The first sign of melanoma is often a change in a mole or a new dark spot.  The  ABCDE system is a way of remembering the signs of melanoma.  A for asymmetry:  The two halves do not match. B for border:  The edges of the growth are irregular. C for color:  A mixture of colors are present instead of an even brown color. D for diameter:  Melanomas are usually (but not always) greater than 6mm - the size of a pencil eraser. E for evolution:  The spot keeps changing in size, shape, and color.  Please check your skin once per month between visits. You can use a small mirror in front and a large mirror behind you to keep an eye on the back side or your  body.   If you see any new or changing lesions before your next follow-up, please call to schedule a visit.  Please continue daily skin protection including broad spectrum sunscreen SPF 30+ to sun-exposed areas, reapplying every 2 hours as needed when you're outdoors.   Staying in the shade or wearing long sleeves, sun glasses (UVA+UVB protection) and wide brim hats (4-inch brim around the entire circumference of the hat) are also recommended for sun protection.    Due to recent changes in healthcare laws, you may see results of your pathology and/or laboratory studies on MyChart before the doctors have had a chance to review them. We understand that in some cases there may be results that are confusing or concerning to you. Please understand that not all results are received at the same time and often the doctors may need to interpret multiple results in order to provide you with the best plan of care or course of treatment. Therefore, we ask that you please give us  2 business days to thoroughly review all your results before contacting the office for clarification. Should we see a critical lab result, you will be contacted sooner.   If You Need Anything After Your Visit  If you have any questions or concerns for your doctor, please call our main line at [phone] and press option 4 to reach your doctor's medical assistant. If  no one answers, please leave a voicemail as directed and we will return your call as soon as possible. Messages left after 4 pm will be answered the following business day.   You may also send us  a message via MyChart. We typically respond to MyChart messages within 1-2 business days.  For prescription refills, please ask your pharmacy to contact our office. Our fax number is [phone].  If you have an urgent issue when the clinic is closed that cannot wait until the next business day, you can page your doctor at the number below.    Please note that while we do our best to be available for urgent issues outside of office hours, we are not available 24/7.   If you have an urgent issue and are unable to reach us , you may choose to seek medical care at your doctor's office, retail clinic, urgent care center, or emergency room.  If you have a medical emergency, please immediately call 911 or go to the emergency department.  Pager Numbers  - Dr. Hester: [phone]  - Dr. Jackquline: [phone]  - Dr. Claudene: [phone]   - Dr. Raymund: [phone]  In the event of inclement weather, please call our main line at [phone] for an update on the status of any delays or closures.  Dermatology Medication Tips: Please keep the boxes that topical medications come in in order to help keep track of the instructions about where and how to use these. Pharmacies typically print the medication instructions only on the boxes and not directly on the medication tubes.   If your medication is too expensive, please contact our office at [phone] option 4 or send us  a message through MyChart.   We are unable to tell what your co-pay for medications will be in advance as this is different depending on your insurance coverage. However, we may be able to find a substitute medication at lower cost or fill out paperwork to get insurance to cover a needed medication.   If a prior authorization is  required to get your medication covered by your insurance company, please allow  us  1-2 business days to complete this process.  Drug prices often vary depending on where the prescription is filled and some pharmacies may offer cheaper prices.  The website www.goodrx.com contains coupons for medications through different pharmacies. The prices here do not account for what the cost may be with help from insurance (it may be cheaper with your insurance), but the website can give you the price if you did not use any insurance.  - You can print the associated coupon and take it with your prescription to the pharmacy.  - You may also stop by our office during regular business hours and pick up a GoodRx coupon card.  - If you need your prescription sent electronically to a different pharmacy, notify our office through [facility] or by phone at [phone] option 4.     Si Usted Necesita Algo Despus de Su Visita  Tambin puede enviarnos un mensaje a travs de Clinical Cytogeneticist. Por lo general respondemos a los mensajes de MyChart en el transcurso de 1 a 2 das hbiles.  Para renovar recetas, por favor pida a su farmacia que se ponga en contacto con nuestra oficina. Randi lakes de fax es [place] [phone].  Si tiene un asunto urgente cuando la clnica est cerrada y que no puede esperar hasta el siguiente da hbil, puede llamar/localizar a su doctor(a) al nmero que aparece a continuacin.   Por favor, tenga en cuenta que aunque hacemos todo lo posible para estar disponibles para asuntos urgentes fuera del horario de [place], no estamos disponibles las 24 horas del da, los 7 [address]  [phone] de la [place].   Si tiene un problema urgente y no puede comunicarse con nosotros, puede optar por buscar atencin mdica  en el consultorio de su doctor(a), en una clnica privada, en un centro de atencin urgente o en una sala de emergencias.  Si tiene engineer, drilling, por favor llame inmediatamente al 911 o vaya a  la sala de emergencias.  Nmeros de bper  - Dr. Hester: [phone]  - Dra. Jackquline: [phone]  - Dr. Claudene: [phone]  - Dra. Kitts: [phone]  En caso de inclemencias del [place], por favor llame a nuestra lnea principal al [phone] para una actualizacin sobre el estado de cualquier retraso o cierre.  Consejos para la medicacin en dermatologa: Por favor, guarde las cajas en las que vienen los medicamentos de uso tpico para ayudarle a seguir las instrucciones sobre dnde y cmo usarlos. Las farmacias generalmente imprimen las instrucciones del medicamento slo en las cajas y no directamente en los tubos del [place].   Si su medicamento es muy caro, por favor, pngase en contacto con landry rieger llamando al [phone] y presione la opcin 4 o envenos un mensaje a travs de Clinical Cytogeneticist.   No podemos decirle cul ser su copago por los medicamentos por adelantado ya que esto es diferente dependiendo de la cobertura de su seguro. Sin embargo, es posible que podamos encontrar un medicamento sustituto a audiological scientist un formulario para que el seguro cubra el medicamento que se considera necesario.   Si se requiere una autorizacin previa para que su compaa de seguros cubra su medicamento, por favor permtanos de 1 a 2 das hbiles para completar este proceso.  Los precios de los medicamentos varan con frecuencia dependiendo del environmental consultant de dnde se surte la receta y alguna farmacias pueden ofrecer precios ms baratos.  El sitio web www.goodrx.com tiene cupones para medicamentos de health and safety inspector. Los precios aqu no tienen en  cuenta lo que podra costar con la ayuda del seguro (puede ser ms barato con su seguro), pero el sitio web puede darle el precio si no visual merchandiser.  - Puede imprimir el cupn correspondiente y llevarlo con su receta a la farmacia.  - Tambin puede pasar por nuestra oficina durante el horario de atencin regular y education officer, museum  una tarjeta de cupones de GoodRx.  - Si necesita que su receta se enve electrnicamente a una farmacia diferente, informe a nuestra oficina a travs de MyChart de [place] o por telfono llamando al [phone] y presione la opcin 4.

## 2024-04-11 ENCOUNTER — Ambulatory Visit: Admitting: Psychiatry

## 2024-04-12 LAB — SURGICAL PATHOLOGY

## 2024-04-15 ENCOUNTER — Encounter: Payer: Self-pay | Admitting: Dermatology

## 2024-04-15 ENCOUNTER — Ambulatory Visit: Payer: Self-pay | Admitting: Dermatology

## 2024-04-15 NOTE — Telephone Encounter (Addendum)
 Called and discussed bx results with patient. She verbalized understanding and denied further questions. Will recheck at next follow up.   ----- Message from Alm Rhyme sent at 04/15/2024  5:10 PM EST ----- FINAL DIAGNOSIS        1. Skin, right mid back at braline 4.5 cm lateral to spine :       RECURRENT MELANOCYTIC NEVUS, CLOSE TO MARGIN    Recurrence at site of previous Dysplastic Nevus Recheck next visit ----- Message ----- From: Interface, Lab In [place] Sent: 04/12/2024   3:53 PM EST To: Alm JAYSON Rhyme, MD

## 2024-04-18 ENCOUNTER — Ambulatory Visit (INDEPENDENT_AMBULATORY_CARE_PROVIDER_SITE_OTHER): Payer: Self-pay | Admitting: Dermatology

## 2024-04-18 DIAGNOSIS — L578 Other skin changes due to chronic exposure to nonionizing radiation: Secondary | ICD-10-CM

## 2024-04-18 DIAGNOSIS — L814 Other melanin hyperpigmentation: Secondary | ICD-10-CM

## 2024-04-18 MED ORDER — RHAPSIDO 25 MG PO TABS: 1.0000 | ORAL_TABLET | Freq: Two times a day (BID) | ORAL | 11 refills | Status: DC

## 2024-04-18 NOTE — Progress Notes (Signed)
 Follow-Up Visit   Subjective  Barbara Russo is a 63 y.o. female who presents for the following: BBL at face 3rd treatment for brown spots on face   The following portions of the chart were reviewed this encounter and updated as appropriate: medications, allergies, medical history  Review of Systems:  No other skin or systemic complaints except as noted in HPI or Assessment and Plan.  Objective  Well appearing patient in no apparent distress; mood and affect are within normal limits.  A focused examination was performed of the following areas: face  Relevant exam findings are noted in the Assessment and Plan.  Browns at face              Pigmented lesions at full face (except upper lip and nose)     Pigmented lesions at nose and upper lip        Assessment & Plan   LENTIGINES/Actinic Damage at forehead, cheeks, nose, chin  Exam: scattered tan macules Due to sun exposure Treatment Plan: Benign-appearing, observe. Recommend daily broad spectrum sunscreen SPF 30+ to sun-exposed areas, reapply every 2 hours as needed.  Call for any changes  Laser safety: Patient was advised in laser safety.  Patient was fitted with laser safety goggles and advised to keep eyes closed during procedure with goggles on. Staff and provider ensured that patient and their own safety goggles were also on and eyes protected during procedure. Laser room door was secured and locked from the inside. Laser room door has laser safety sign affixed to the outside of the door.   Sample kit given for 3rd treatment today     [place] BBL - 04/18/24 1300      Patient Details   Skin Type: II    Anesthestic Cream Applied: No    Photo Takes: Yes    Consent Signed: Yes    Improvement from Previous Treatment: Yes      Treatment Details   Date: 04/18/24    Treatment #: 3   for pigmented spots on forehead, cheeks, nose and chin   Area: f    Filter: 1st Pass;2nd Pass      1st Pass    Location: F    Device: 515   pigmented lesions (brown spots at full face)   BBL j/cm2: 14    PW Msec Sec: 10    Target Temp: 25    Pulses: 150    15x15: this one      2nd Pass   Location: F    Device: 515   pigmented lesions (brown spots at upper lip and nose)   BBL j/cm2: 13    PW Msec Sec: 10    Target Temp: 25    Pulses: 150    15x15: this one       Patient has no history of cold sores   Patient tolerated the procedure well.   [place] avoidance was stressed. The patient will call with any problems, questions or concerns prior to their next appointment.   SEBORRHEIC KERATOSIS - Stuck-on, waxy, tan-brown papules and/or plaques  - Benign-appearing - Discussed benign etiology and prognosis. - Observe - Call for any changes  LENTIGINES Exam: scattered tan macules Due to sun exposure Treatment Plan: Benign-appearing, observe. Recommend daily broad spectrum sunscreen SPF 30+ to sun-exposed areas, reapply every 2 hours as needed.  Call for any changes   ACTINIC DAMAGE - chronic, secondary to cumulative UV radiation exposure/sun exposure over time - diffuse scaly erythematous macules  with underlying dyspigmentation - Recommend daily broad spectrum sunscreen SPF 30+ to sun-exposed areas, reapply every 2 hours as needed.  - Recommend staying in the shade or wearing long sleeves, sun glasses (UVA+UVB protection) and wide brim hats (4-inch brim around the entire circumference of the hat). - Call for new or changing lesions.  ACTINIC SKIN DAMAGE   LENTIGO    No follow-ups on file.  IEleanor Blush, CMA, am acting as scribe for Alm Rhyme, MD.  Documentation: I have reviewed the above documentation for accuracy and completeness, and I agree with the above.  Alm Rhyme, MD

## 2024-04-18 NOTE — Addendum Note (Signed)
 Addended by: TANDA SETTER A on: 04/18/2024 06:02 PM   Modules accepted: Orders

## 2024-04-18 NOTE — Patient Instructions (Signed)

## 2024-04-23 ENCOUNTER — Encounter: Payer: Self-pay | Admitting: Dermatology

## 2024-04-29 ENCOUNTER — Telehealth: Payer: Self-pay

## 2024-04-29 NOTE — Telephone Encounter (Signed)
 [facility] called and left vm on nurse line regarding Rhapsido bridge program. They have been trying to reach out to patient and enrollment in the bridge program. Patient was hesitant about giving information to them regarding this.   Tried calling patient regarding this. She did not answer. Lm regarding this and if she had questions to give us  a call.  Give number to reach [facility] regarding bridge program [phone]

## 2024-04-30 ENCOUNTER — Ambulatory Visit: Admitting: Psychiatry

## 2024-05-02 ENCOUNTER — Ambulatory Visit: Admitting: Dermatology

## 2024-05-02 DIAGNOSIS — Z7189 Other specified counseling: Secondary | ICD-10-CM

## 2024-05-02 DIAGNOSIS — L299 Pruritus, unspecified: Secondary | ICD-10-CM

## 2024-05-02 DIAGNOSIS — L503 Dermatographic urticaria: Secondary | ICD-10-CM | POA: Diagnosis not present

## 2024-05-02 DIAGNOSIS — Z79899 Other long term (current) drug therapy: Secondary | ICD-10-CM | POA: Diagnosis not present

## 2024-05-02 DIAGNOSIS — L501 Idiopathic urticaria: Secondary | ICD-10-CM

## 2024-05-02 DIAGNOSIS — L509 Urticaria, unspecified: Secondary | ICD-10-CM

## 2024-05-02 NOTE — Patient Instructions (Signed)

## 2024-05-02 NOTE — Progress Notes (Signed)
   Follow-Up Visit   Subjective  Barbara Russo is a 63 y.o. female who presents for the following:  Patient is here for follow up on dermatographism/ urticaria Patient reports she is doing extremely well while on Rhapsido tablets. She is no longer itchy and able to sleep well at night. States she has been dealing with itch since she was a child.  She denies any new breakouts and is staying clear  The following portions of the chart were reviewed this encounter and updated as appropriate: medications, allergies, medical history  Review of Systems:  No other skin or systemic complaints except as noted in HPI or Assessment and Plan.  Objective  Well appearing patient in no apparent distress; mood and affect are within normal limits.  A focused examination was performed of the following areas:  arms, chest, back  Relevant exam findings are noted in the Assessment and Plan.    Assessment & Plan   DERMATOGRAPHISM / Urticaria Exacerbated by rubbing scratching or showers Has had since she was a child and has been itching most of her life without relief. She is thrilled and very happy with the lack of itching since she started Rhapsido. She is having no side effects from taking the medication. Improved since starting Rhapsido 25 mg tab   Arms / chest / back Exam: pinkness at arms   Chronic and persistent condition with duration or expected duration over one year. Condition is improving with Rhapsido treatment but not currently at goal. Treatment Plan:  Continue Rhapsido 25 mg tabs - take 1 tab by mouth bid  Continue  For dermatographism or Itch: Start non sedating antihistamine (either Allegra 180mg , or Claritin 10mg , or Zyrtec 10mg ) daily.  All these are non-prescription (Over the Counter).   Start out with 1 pill a day.   After a week if not improving may increase to 2 pills a day.   After another week if not improving may increase to 3 pills a day.   After another week if  still not improving may take up to 4 pills a day. Stay at highest dose that keeps condition controlled, but only up to 4 pills a day. Stay at the controlling dose for at least 2 weeks. Contact office if taking 4 pills of antihistamine a day for at least 2 weeks without control of condition as other options may be available.   Will recheck at next appt in February when patient has BBL procedure    If Rhapsido fails to continue to give good results, may consider other options such as Dupixent or Xolair   Long term medication management.  Patient is using long term (months to years) prescription medication  to control their dermatologic condition.  These medications require periodic monitoring to evaluate for efficacy and side effects and may require periodic laboratory monitoring.  PRURITUS   URTICARIA   Related Medications Remibrutinib (RHAPSIDO) 25 MG TABS Take 1 tablet by mouth in the morning and at bedtime. DERMATOGRAPHIC URTICARIA   CHRONIC IDIOPATHIC URTICARIA   COUNSELING AND COORDINATION OF CARE   MEDICATION MANAGEMENT    No follow-ups on file.  IEleanor Blush, CMA, am acting as scribe for Alm Rhyme, MD.  Documentation: I have reviewed the above documentation for accuracy and completeness, and I agree with the above.  Alm Rhyme, MD

## 2024-05-06 ENCOUNTER — Encounter: Payer: Self-pay | Admitting: Dermatology

## 2024-05-07 ENCOUNTER — Telehealth: Payer: Self-pay

## 2024-05-07 NOTE — Telephone Encounter (Signed)
 [facility] called to ask a few questions about prior authorization and to inform us  that patient has been approved for Rhapsido 25 mg tabs medication for 12 months.  PA # [id number]   Rep states that will also be faxing approval over.

## 2024-05-14 ENCOUNTER — Ambulatory Visit: Payer: Self-pay

## 2024-05-14 NOTE — Telephone Encounter (Signed)
 Called to follow up with patient. Patient states this morning she was trying to sit up because she became a bit stuffy and fell out of the bed onto her side on top of her trash bin. Patient decline having any head pain. Patient states she has some rib pain with movement. Patient was advised to go to the ED. Patient declined. Patient is scheduled with Dr Bennett at [facility] at 12:00pm on 05/15/24. Patient was advised if her pain and discomfort becomes unbearable to go to local ED or UC to be evaluated. Patient verbalized understanding.

## 2024-05-14 NOTE — Telephone Encounter (Signed)
 FYI Only or Action Required?: Action required by provider: request for appointment.  Patient was last seen in primary care on 10/23/2023 by Hope Merle, MD.  Called Nurse Triage reporting Fall.  Symptoms began yesterday.  Interventions attempted: Nothing.  Symptoms are: unchanged. Clemens out of bed last night, hit head and left side of chest. No lacerations. Has severe left rib pain with movement. Recommended ED due to hittng head, declined ED, requests appointment in office. Please advise pt. Triage Disposition: Go to ED Now (or PCP Triage)  Patient/caregiver understands and will follow disposition?: No, refuses disposition      Copied from CRM #[id number]. Topic: Clinical - Red Word Triage >> May 14, 2024  7:39 AM Pinkey ORN wrote: Red Word that prompted transfer to Nurse Triage: Fall >> May 14, 2024  7:41 AM Pinkey ORN wrote: Patient states she experienced a fall last night and has some concerns of possible broken ribs. Patient states she has sharp pains running through her back, as well as difficulty moving and getting up and down.  Reason for Disposition  Patient sounds very sick or weak to the triager  Answer Assessment - Initial Assessment Questions 1. MECHANISM: How did the fall happen?     Clemens out of bed 2. DOMESTIC VIOLENCE AND ELDER ABUSE SCREENING: Did you fall because someone pushed you or tried to hurt you? If Yes, ask: Are you safe now?     no 3. ONSET: When did the fall happen? (e.g., minutes, hours, or days ago)     Last night 4. LOCATION: What part of the body hit the ground? (e.g., back, buttocks, head, hips, knees, hands, head, stomach)     Left chest, head 5. INJURY: Did you hurt (injure) yourself when you fell? If Yes, ask: What did you injure? Tell me more about this? (e.g., body area; type of injury; pain severity)     Ribs hurt with movement 6. PAIN: Is there any pain? If Yes, ask: How bad is the pain? (e.g., Scale 0-10; or none, mild,       9 7. SIZE: For cuts, bruises, or swelling, ask: How large is it? (e.g., inches or centimeters)      no 8. PREGNANCY: Is there any chance you are pregnant? When was your last menstrual period?     no 9. OTHER SYMPTOMS: Do you have any other symptoms? (e.g., dizziness, fever, weakness; new-onset or worsening).      no 10. CAUSE: What do you think caused the fall (or falling)? (e.g., dizzy spell, tripped)       Clemens over  Protocols used: Falls and [facility]

## 2024-05-15 ENCOUNTER — Ambulatory Visit

## 2024-05-15 ENCOUNTER — Ambulatory Visit: Admission: RE | Admit: 2024-05-15 | Discharge: 2024-05-15 | Disposition: A | Source: Ambulatory Visit

## 2024-05-15 VITALS — BP 114/74 | HR 75 | Temp 98.1°F | Ht 70.5 in | Wt 183.0 lb

## 2024-05-15 DIAGNOSIS — Z9189 Other specified personal risk factors, not elsewhere classified: Secondary | ICD-10-CM

## 2024-05-15 DIAGNOSIS — S0003XA Contusion of scalp, initial encounter: Secondary | ICD-10-CM

## 2024-05-15 DIAGNOSIS — R0789 Other chest pain: Secondary | ICD-10-CM | POA: Diagnosis not present

## 2024-05-15 MED ORDER — CELECOXIB 200 MG PO CAPS: 200.0000 mg | ORAL_CAPSULE | Freq: Two times a day (BID) | ORAL | 0 refills | Status: DC | PRN

## 2024-05-15 MED ORDER — DICLOFENAC SODIUM 1 % EX GEL: CUTANEOUS | 1 refills | Status: AC

## 2024-05-15 NOTE — Progress Notes (Unsigned)
 Subjective:   This visit was conducted in person. The patient gave informed consent to the use of Abridge AI technology to record the contents of the encounter as documented below.   Patient ID: Barbara Russo, female    DOB: October 03, 1960, 63 y.o.   MRN: [id number]   Discussed the use of AI scribe software for clinical note transcription with the patient, who gave verbal consent to proceed.  History of Present Illness Barbara Russo is a 63 year old female who presents with left-sided chest pain following a fall.  She experiences significant left-sided chest pain following a fall that occurred two nights ago. The pain originates from her ribs and radiates to her back, with a sharp quality that worsens with movement. It is constant, rated as a 2/10 at rest and escalates to 9-10/10 with movement, particularly when lying down or rolling over in bed.  The fall happened while she was asleep in bed, propped up, and she toppled over, hitting a metal waste can with her chest. She also hit her head on a wooden side table, resulting in a bump but no current headache or significant head pain. No palpitations, lightheadedness, or dizziness were experienced prior to the fall.  For pain management, she has used tramadol and muscle relaxers without relief. She also took Lyrica about an hour before bedtime, which may have contributed to her drowsiness at the time of the fall. She has been on Lyrica for a few months. She previously used gabapentin  for many years but found it made her very tired. Her headaches have improved significantly since the ablation.  No palpitations, lightheadedness, dizziness, or headache. Reports a bump on the head without pain.  Review of Systems  All other systems reviewed and are negative.       No Known Allergies  Current Outpatient Medications on File Prior to Visit  Medication Sig Dispense Refill   botulinum toxin Type A (BOTOX) 200 units injection Inject into the  muscle.     busPIRone  (BUSPAR ) 15 MG tablet Take 1 tablet (15 mg total) by mouth QID. 360 tablet 1   butalbital-acetaminophen-caffeine (FIORICET) 50-325-40 MG tablet Take by mouth.     cetirizine (ZYRTEC) 10 MG tablet Take by mouth 2 (two) times daily.     Dextromethorphan-buPROPion  ER (AUVELITY ) 45-105 MG TBCR Take 1 tablet by mouth 2 (two) times daily. 60 tablet 2   famotidine (PEPCID) 40 MG tablet Take 1 tablet by mouth 2 (two) times daily.     fluticasone  (FLONASE ) 50 MCG/ACT nasal spray Place 2 sprays into both nostrils daily. 1 g 11   mometasone (ELOCON) 0.1 % cream Apply 1 Application topically daily.     pregabalin (LYRICA) 75 MG capsule 1 tab in AM and 1 tab 1 hours before bedtime     QULIPTA 60 MG TABS Take 1 tablet by mouth daily.     Remibrutinib  (RHAPSIDO ) 25 MG TABS Take 1 tablet by mouth in the morning and at bedtime. 60 tablet 11   REYVOW 100 MG TABS Take 1 tablet by mouth as needed.     simvastatin  (ZOCOR ) 40 MG tablet Take 1 tablet (40 mg total) by mouth daily. 90 tablet 3   topiramate (TOPAMAX) 100 MG tablet Take 100 mg by mouth 2 (two) times daily.     valACYclovir  (VALTREX ) 500 MG tablet TAKE 1 TABLET BY MOUTH TWICE DAILY FOR 7 DAYS. START 1 DAY BEFORE PROCEDURE     Vitamin D , Ergocalciferol , (DRISDOL ) 1.25 MG (50000 UNIT)  CAPS capsule Take 1 capsule (50,000 Units total) by mouth every 7 (seven) days. 12 capsule 1   No current facility-administered medications on file prior to visit.    BP 114/74 (BP Location: Left Arm, Patient Position: Sitting, Cuff Size: Large)   Pulse 75   Temp 98.1 F (36.7 C) (Oral)   Ht 5' 10.5 (1.791 m)   Wt 183 lb (83 kg)   SpO2 97%   BMI 25.89 kg/m   Objective:      Physical Exam GENERAL: Alert, cooperative, well developed, no acute distress. HEAD: Normocephalic atraumatic. EYES: Extraocular movements intact BL, pupils round, equal and reactive to light BL, conjunctivae normal BL. CARDIOVASCULAR: Normal heart rate and rhythm, S1  and S2 normal without murmurs. CHEST: Clear to auscultation bilaterally, no wheezes, rhonchi, or crackles. ABDOMEN: Soft, non tender, non distended, without organomegaly, normal bowel sounds. Mild ecchymosis over left lateral upper abdomen,Tender to palpation. EXTREMITIES: No cyanosis or edema. NEUROLOGICAL: Oriented to person, place and time, no gait abnormalities, moves all extremities without gross motor or sensory deficit. Cranial nerves grossly intact. Normal finger to nose. Normal reflexes. Sensation symmetric and intact. SCALP: Palpable, circular, area of swelling, about 1 x 1 cm on the left parietal scalp, tender to palpation, no erythema, ecchymosis or skin lesions.        Assessment & Plan:   Assessment & Plan Left sided rib pain At risk for medication side effect Acute left lower chest wall trauma with significant pain after mechanical fall 2 days ago, differential includes rib fracture and soft tissue injury. Pain unrelieved by tramadol or muscle relaxers. Increased fall risk due to sedating effects of Lyrica and muscle relaxers. Discussed minimizing fall risk.  - Ordered chest x-ray to evaluate for rib fracture. - Prescribed Voltaren  gel 3-4 times daily. - Recommended over-the-counter Icy Hot or Tiger Balm patches. - Prescribed Celebrex  200 mg twice daily as needed, with food. - Continue Pepcid for gastric protection. - Advised follow-up if pain does not improve in 7 days. - Advised taking Lyrica and muscle relaxers only when lying flat in bed. - Discussed potential adjustment of Lyrica dosage with headache specialist if necessary.  Scalp contusion due to fall Scalp contusion with soft tissue swelling, no acute neurological symptoms or headache, low suspicion for intracranial pathology, conservative management is appropriate..  - Monitor for new symptoms such as confusion or severe headache - Advised emergency room visit if new symptoms develop.    Return for worsening  of symptoms or failure to improve.   Alecsander Hattabaugh K Jeannett Dekoning, MD  05/15/24     Contains text generated by Abridge.

## 2024-05-15 NOTE — Patient Instructions (Addendum)
 Thank you for visiting [facility] today! Here's what we talked about: - START Voltaren gel, combine with icy hot or tiger balm patch. Take Celebrex as needed for pain  - Use Lyrica with caution as it will make you very drowsy

## 2024-05-20 ENCOUNTER — Other Ambulatory Visit: Payer: Self-pay

## 2024-05-20 ENCOUNTER — Ambulatory Visit: Admitting: Family Medicine

## 2024-05-20 ENCOUNTER — Ambulatory Visit: Payer: Self-pay

## 2024-05-20 ENCOUNTER — Ambulatory Visit
Admission: RE | Admit: 2024-05-20 | Discharge: 2024-05-20 | Disposition: A | Source: Ambulatory Visit | Attending: Family Medicine | Admitting: Family Medicine

## 2024-05-20 ENCOUNTER — Ambulatory Visit: Payer: Self-pay | Admitting: Family Medicine

## 2024-05-20 VITALS — BP 130/80 | HR 84 | Temp 98.0°F | Ht 70.5 in | Wt 185.1 lb

## 2024-05-20 DIAGNOSIS — R0789 Other chest pain: Secondary | ICD-10-CM

## 2024-05-20 DIAGNOSIS — L509 Urticaria, unspecified: Secondary | ICD-10-CM

## 2024-05-20 DIAGNOSIS — R9389 Abnormal findings on diagnostic imaging of other specified body structures: Secondary | ICD-10-CM

## 2024-05-20 DIAGNOSIS — Z23 Encounter for immunization: Secondary | ICD-10-CM

## 2024-05-20 MED ORDER — RHAPSIDO 25 MG PO TABS: 1.0000 | ORAL_TABLET | Freq: Two times a day (BID) | ORAL | 11 refills | Status: DC

## 2024-05-20 MED ORDER — AMOXICILLIN 875 MG PO TABS: 875.0000 mg | ORAL_TABLET | Freq: Two times a day (BID) | ORAL | 0 refills | Status: AC

## 2024-05-20 MED ORDER — LIDOCAINE 5 % EX PTCH: 1.0000 | MEDICATED_PATCH | CUTANEOUS | 0 refills | Status: DC

## 2024-05-20 MED ORDER — AZITHROMYCIN 250 MG PO TABS: ORAL_TABLET | ORAL | 0 refills | Status: AC

## 2024-05-20 NOTE — Assessment & Plan Note (Signed)
 With abnormal cxr and concern for occult PNA given yellow sputum.   D/w pt about options.  Lidocaine  patch for pain.  Update us  as needed.  Send for CT and start amoxil /azithro given concern for PNA.  D/w pt.  She agrees with plan.

## 2024-05-20 NOTE — Telephone Encounter (Signed)
                     FYI Only or Action Required?: FYI only for provider: appointment scheduled on this morning.  Patient was last seen in primary care on 05/15/2024 by Bennett Reuben POUR, MD.  Called Nurse Triage reporting Medication Reaction. Chest pain - pt is reporting nausea, vomiting and shakiness d/t Celebrex   Symptoms began After fall.  Interventions attempted: Other: Seen in office and given medications.  Symptoms are: unchanged.  Triage Disposition: See Physician Within 24 Hours  Patient/caregiver understands and will follow disposition?: Yes                       from CRM #[id number]. Topic: Clinical - Red Word Triage >> May 20, 2024  7:46 AM Barbara Russo wrote: Red Word that prompted transfer to Nurse Triage: Patient was seen on 12/3- was given Celebrex  and it has made her sick- she is allergic to that type of medication. Still having alot of chest and rib pain. Reason for Disposition  [1] Chest pain lasts < 5 minutes AND [2] NO chest pain or cardiac symptoms (e.g., breathing difficulty, sweating) now  (Exception: Chest pains that last only a few seconds.)  Answer Assessment - Initial Assessment Questions Pt has chest pain from fall. Pt was seen by Dr. Bennett. Dr. Bennett prescribed medication that pt states is causing her stomach upset, Shakiness and vomiting. Chest pain is not improved.       1. LOCATION: Where does it hurt?       Chest pain from fall 3. ONSET: When did the chest pain begin? (Minutes, hours or days)      After fall 9. CAUSE: What do you think is causing the chest pain?     fall 10. OTHER SYMPTOMS: Do you have any other symptoms? (e.g., dizziness, nausea, vomiting, sweating, fever, difficulty breathing, cough)       Nausea and shakiness from Celebrex   Protocols used: Chest Pain-A-[facility]

## 2024-05-20 NOTE — Patient Instructions (Addendum)
 If you can't get lidoderm  patches, then try getting OTC lidocaine  4% patch.  Take care.  Glad to see you. Let me know if you can't get the CT set up.   I would start amoxil  and azithromycin .  Let us  know if you are not getting better.

## 2024-05-20 NOTE — Progress Notes (Signed)
 Fax received for new prescription to be sent to [facility] pharmacy [phone] fax [phone].

## 2024-05-20 NOTE — Progress Notes (Signed)
 Images reviewed with patient.  Prev OV d/w pt.   IMPRESSION: 1. Increase in densities in the left lung base which are annotated. Consider further evaluation by CT of the chest for further characterization.   She was asleep in bed, then fell out of bed and had pain from that.  Safe at home.    Pain is minimally better now.  Pain with lifting.  L sided pain along the ribs.  Sore to touch locally.  Pain laughing or sneezing.  Can still take a deep breath but pain with that.  Yellow sputum, recently noted.  No fevers.  Fatigued.    She can't tolerate nsaids.  Med and allergy list updated.   Meds, vitals, and allergies reviewed.   ROS: Per HPI unless specifically indicated in ROS section   Nad Ncat TM wnl B MMM Neck supple No LA Rrr Ctab, no focal dec in BS.  L chest wall ttp along lower ant ribs w/o rash.  Abd soft not ttp Normal BS Skin well perfused.  No BLE edema.

## 2024-05-20 NOTE — Telephone Encounter (Signed)
Spoke with pt. She is aware of her results. Nothing further was needed. 

## 2024-05-22 ENCOUNTER — Ambulatory Visit: Payer: Self-pay

## 2024-05-22 DIAGNOSIS — R079 Chest pain, unspecified: Secondary | ICD-10-CM

## 2024-05-23 ENCOUNTER — Other Ambulatory Visit: Payer: Self-pay

## 2024-05-23 DIAGNOSIS — L509 Urticaria, unspecified: Secondary | ICD-10-CM

## 2024-05-23 MED ORDER — RHAPSIDO 25 MG PO TABS: 1.0000 | ORAL_TABLET | Freq: Two times a day (BID) | ORAL | 11 refills | Status: AC

## 2024-05-27 ENCOUNTER — Encounter: Payer: Self-pay | Admitting: Dermatology

## 2024-05-27 ENCOUNTER — Other Ambulatory Visit: Payer: Self-pay | Admitting: Psychiatry

## 2024-05-27 DIAGNOSIS — F331 Major depressive disorder, recurrent, moderate: Secondary | ICD-10-CM

## 2024-06-20 ENCOUNTER — Telehealth: Payer: Self-pay

## 2024-06-20 NOTE — Telephone Encounter (Signed)
 Discussed prophylactic valtrex  for upcoming appointment.  Patient advised she does have Valtrex  500mg  at home.  Advised to take Valtrex  500mg  1 po bid for 7 days starting 1 day prior to procedure./sh

## 2024-06-24 ENCOUNTER — Ambulatory Visit (INDEPENDENT_AMBULATORY_CARE_PROVIDER_SITE_OTHER): Payer: Self-pay | Admitting: Dermatology

## 2024-06-24 DIAGNOSIS — L988 Other specified disorders of the skin and subcutaneous tissue: Secondary | ICD-10-CM

## 2024-06-24 NOTE — Patient Instructions (Signed)

## 2024-06-24 NOTE — Progress Notes (Signed)
" ° °  Follow-Up Visit   Subjective  Barbara Russo is a 64 y.o. female who presents for the following: filler for facial elastosis  The following portions of the chart were reviewed this encounter and updated as appropriate: medications, allergies, medical history  Review of Systems:  No other skin or systemic complaints except as noted in HPI or Assessment and Plan.  Objective  Well appearing patient in no apparent distress; mood and affect are within normal limits.  A focused examination was performed of the face. Relevant physical exam findings are noted in the Assessment and Plan or shown in photos.  Before filler photos         After filler photos           Injection map photo    Assessment & Plan    Facial Elastosis  Prior to the procedure, the patient's past medical history, allergies and the rare but potential risks and complications were reviewed with the patient and a signed consent was obtained. Pre and post-treatment care was discussed and instructions provided.   Location: lips, marionette lines, and chin  Filler Type: Restylane refyne  Procedure: Lidocaine -tetracaine ointment was applied to treatment areas to achieve good local anesthesia. The area was prepped thoroughly with Puracyn. After introducing the needle into the desired treatment area, the syringe plunger was drawn back to ensure there was no flash of blood prior to injecting the filler in order to minimize risk of intravascular injection and vascular occlusion. After injection of the filler, the treated areas were cleansed and iced to reduce swelling. Post-treatment instructions were reviewed with the patient.       Patient tolerated the procedure well. The patient will call with any problems, questions or concerns prior to their next appointment.   Return as scheduled with Dr. LOIS FERNS, Andrea Kerns, CMA, am acting as scribe for Rexene Rattler, MD .   Documentation: I have reviewed  the above documentation for accuracy and completeness, and I agree with the above.  Rexene Rattler, MD      "

## 2024-07-03 ENCOUNTER — Encounter

## 2024-07-05 ENCOUNTER — Encounter: Payer: Self-pay | Admitting: Nurse Practitioner

## 2024-07-05 ENCOUNTER — Encounter

## 2024-07-05 ENCOUNTER — Ambulatory Visit: Admitting: Nurse Practitioner

## 2024-07-05 ENCOUNTER — Other Ambulatory Visit: Payer: Self-pay | Admitting: Nurse Practitioner

## 2024-07-05 VITALS — BP 116/80 | HR 82 | Temp 97.4°F | Ht 70.5 in | Wt 177.0 lb

## 2024-07-05 DIAGNOSIS — N6313 Unspecified lump in the right breast, lower outer quadrant: Secondary | ICD-10-CM

## 2024-07-05 NOTE — Progress Notes (Unsigned)
 "  Established Patient Office Visit  Subjective:  Patient ID: Barbara Russo, female    DOB: May 28, 1961  Age: 64 y.o. MRN: [id number]  CC:  Chief Complaint  Patient presents with   Acute Visit    Lump on right breast   Discussed the use of AI scribe software for clinical note transcription with the patient, who gave verbal consent to proceed.  History of Present Illness  Discussed the use of AI scribe software for clinical note transcription with the patient, who gave verbal consent to proceed.  History of Present Illness   Barbara Russo is a 64 year old female who presents with a new lump under a long-standing cyst on her right breast.  About a week ago she noticed a new lump beneath a long-standing cyst on her right breast. It appeared quickly, was initially itchy, and felt contiguous with the cyst. The itch has resolved and there is no pain. The cyst has a white bump similar to prior cysts on her face and wrist.  She has used Zyrtec, which helped the itching. The area under the right breast was inflamed and red, and she has been applying a dermatologist-prescribed cream, but has been resolved now.  She has not had a mammogram in the past year after missing an appointment.  HPI   Past Medical History:  Diagnosis Date   Actinic keratosis    Anxiety    Depression    Dysplastic nevus 10/03/2019   L lower back paraspinal mod to severe (shave removal)   Dysplastic nevus 09/23/2020   R lat breast - moderate   Dysplastic nevus 09/29/2021   Left Upper Back 5.5cm lat to spine, medial to mid scapula, moderate atypia   Dysplastic nevus 10/05/2023   left mid back at braline 9 cm lat to spine - moderate   Dysplastic nevus 10/05/2023   right  mid back at braline 4.5 cm lat to spine - moderate   Dysplastic nevus 10/05/2023   left mid back above braline 6 cm lat to spine - moderate   Dysplastic nevus 04/09/2024   right mid back at braline 4.5 cm lateral to spine - recurrent -  will recheck at next follow up   Squamous cell carcinoma of skin 09/26/2019   SCCIS - L prox lat pretibial     Past Surgical History:  Procedure Laterality Date   BREAST BIOPSY Left 2008   neg    Family History  Adopted: Yes    Social History   Socioeconomic History   Marital status: Single    Spouse name: Not on file   Number of children: 0   Years of education: Not on file   Highest education level: Bachelor's degree (e.g., BA, AB, BS)  Occupational History   Not on file  Tobacco Use   Smoking status: Never   Smokeless tobacco: Never  Vaping Use   Vaping status: Never Used  Substance and Sexual Activity   Alcohol use: Yes    Alcohol/week: 2.0 standard drinks of alcohol    Types: 2 Cans of beer per week   Drug use: Never   Sexual activity: Not Currently  Other Topics Concern   Not on file  Social History Narrative   Not on file   Social Drivers of Health   Tobacco Use: Low Risk (07/05/2024)   Patient History    Smoking Tobacco Use: Never    Smokeless Tobacco Use: Never    Passive Exposure: Not on file  Financial Resource Strain: Low  Risk (05/14/2024)   Overall Financial Resource Strain (CARDIA)    Difficulty of Paying Living Expenses: Not hard at all  Food Insecurity: No Food Insecurity (05/14/2024)   Epic    Worried About Programme Researcher, Broadcasting/film/video in the Last Year: Never true    Ran Out of Food in the Last Year: Never true  Transportation Needs: No Transportation Needs (05/14/2024)   Epic    Lack of Transportation (Medical): No    Lack of Transportation (Non-Medical): No  Physical Activity: Insufficiently Active (05/14/2024)   Exercise Vital Sign    Days of Exercise per Week: 3 days    Minutes of Exercise per Session: 20 min  Stress: No Stress Concern Present (05/14/2024)   [employer] Occupational [employer] - Occupational Stress Questionnaire    Feeling of Stress: Only a little  Social Connections: Unknown (05/14/2024)   Social Connection and Isolation  Panel    Frequency of Communication with Friends and Family: Patient declined    Frequency of Social Gatherings with Friends and Family: Patient declined    Attends Religious Services: Patient declined    Active Member of Clubs or Organizations: Yes    Attends Banker Meetings: Patient declined    Marital Status: Never married  Intimate Partner Violence: Not on file  Depression (PHQ2-9): Low Risk (07/05/2024)   Depression (PHQ2-9)    PHQ-2 Score: 0  Alcohol Screen: Low Risk (05/14/2024)   Alcohol Screen    Last Alcohol Screening Score (AUDIT): 3  Housing: Low Risk (05/14/2024)   Epic    Unable to Pay for Housing in the Last Year: No    Number of Times Moved in the Last Year: 0    Homeless in the Last Year: No  Utilities: Patient Declined (12/07/2022)   Received from [facility] Utilities    Threatened with loss of utilities: Patient declined  Health Literacy: Not on file     Outpatient Medications Prior to Visit  Medication Sig Dispense Refill   AUVELITY  45-105 MG TBCR TAKE 1 TABLET BY MOUTH TWICE A DAY 60 tablet 2   botulinum toxin Type A (BOTOX) 200 units injection Inject into the muscle.     busPIRone  (BUSPAR ) 15 MG tablet Take 1 tablet (15 mg total) by mouth QID. 360 tablet 1   butalbital-acetaminophen-caffeine (FIORICET) 50-325-40 MG tablet Take by mouth.     cetirizine (ZYRTEC) 10 MG tablet Take by mouth 2 (two) times daily.     famotidine (PEPCID) 40 MG tablet Take 1 tablet by mouth 2 (two) times daily.     fluticasone  (FLONASE ) 50 MCG/ACT nasal spray Place 2 sprays into both nostrils daily. 1 g 11   mometasone (ELOCON) 0.1 % cream Apply 1 Application topically daily.     pregabalin (LYRICA) 75 MG capsule 1 tab in AM and 1 tab 1 hours before bedtime     QULIPTA 60 MG TABS Take 1 tablet by mouth daily.     Remibrutinib  (RHAPSIDO ) 25 MG TABS Take 1 tablet by mouth in the morning and at bedtime. 60 tablet 11   REYVOW 100 MG TABS Take 1  tablet by mouth as needed.     simvastatin  (ZOCOR ) 40 MG tablet Take 1 tablet (40 mg total) by mouth daily. 90 tablet 3   topiramate (TOPAMAX) 100 MG tablet Take 100 mg by mouth 2 (two) times daily.     Vitamin D , Ergocalciferol , (DRISDOL ) 1.25 MG (50000 UNIT) CAPS capsule Take 1 capsule (  50,000 Units total) by mouth every 7 (seven) days. 12 capsule 1   diclofenac  Sodium (VOLTAREN ) 1 % GEL Use on affected area 3-4 times daily 50 g 1   lidocaine  (LIDODERM ) 5 % Place 1 patch onto the skin daily. Remove & Discard patch within 12 hours or as directed by MD 30 patch 0   No facility-administered medications prior to visit.    Allergies[1]  ROS Review of Systems Negative unless indicated in HPI.    Objective:    Physical Exam Constitutional:      Appearance: Normal appearance.  Cardiovascular:     Rate and Rhythm: Normal rate and regular rhythm.     Pulses: Normal pulses.     Heart sounds: Normal heart sounds.  Chest:  Breasts:    Right: Normal. No inverted nipple or nipple discharge.     Left: Normal. No inverted nipple or nipple discharge.  Neurological:     General: No focal deficit present.     Mental Status: She is alert and oriented to person, place, and time.  Psychiatric:        Mood and Affect: Mood normal.        Behavior: Behavior normal.     BP 116/80   Pulse 82   Temp (!) 97.4 F (36.3 C)   Ht 5' 10.5 (1.791 m)   Wt 177 lb (80.3 kg)   SpO2 97%   BMI 25.04 kg/m  Wt Readings from Last 3 Encounters:  07/05/24 177 lb (80.3 kg)  05/20/24 185 lb 2 oz (84 kg)  05/15/24 183 lb (83 kg)     Health Maintenance  Topic Date Due   Cervical Cancer Screening (HPV/Pap Cotest)  Never done   Colonoscopy  Never done   Pneumococcal Vaccine: 50+ Years (1 of 1 - PCV) Never done   COVID-19 Vaccine (5 - 2025-26 season) 02/12/2024   Hepatitis C Screening  07/11/2024 (Originally 10[birth year]31/1978)   HIV Screening  07/11/2024 (Originally 10/31/1975)   Mammogram  11/23/2024    DTaP/Tdap/Td (2 - Td or Tdap) 02/16/2028   Influenza Vaccine  Completed   HPV VACCINES (No Doses Required) Completed   Zoster Vaccines- Shingrix  Completed   Hepatitis B Vaccines 19-59 Average Risk  Aged Out   Meningococcal B Vaccine  Aged Out    There are no preventive care reminders to display for this patient.  Lab Results  Component Value Date   TSH 1.35 10/23/2023   Lab Results  Component Value Date   WBC 6.9 06/16/2023   HGB 14.1 06/16/2023   HCT 41.2 06/16/2023   MCV 97.2 06/16/2023   PLT 212.0 06/16/2023   Lab Results  Component Value Date   NA 142 06/16/2023   K 4.2 06/16/2023   CO2 23 06/16/2023   GLUCOSE 102 (H) 06/16/2023   BUN 16 06/16/2023   CREATININE 0.91 06/16/2023   BILITOT 0.4 06/16/2023   ALKPHOS 87 06/16/2023   AST 21 06/16/2023   ALT 25 06/16/2023   PROT 6.6 06/16/2023   ALBUMIN 4.1 06/16/2023   CALCIUM 9.1 06/16/2023   GFR 67.56 06/16/2023   Lab Results  Component Value Date   CHOL 213 (H) 06/16/2023   Lab Results  Component Value Date   HDL 81.90 06/16/2023   Lab Results  Component Value Date   LDLCALC 111 (H) 06/16/2023   Lab Results  Component Value Date   TRIG 101.0 06/16/2023   Lab Results  Component Value Date   CHOLHDL 3 06/16/2023  Lab Results  Component Value Date   HGBA1C 5.6 06/16/2023      Assessment & Plan:   Assessment & Plan Mass of lower outer quadrant of right breast Assessment and Plan    Right breast lump with chronic pruritic rash Intermittent right breast lump with chronic pruritic rash.  No lump noticed during breast examination.  Patient reports longstanding rash on the breast depicted on the picture above. - Ordered diagnostic mammogram for right breast lump.   Orders:   MM 3D DIAGNOSTIC MAMMOGRAM BILATERAL BREAST; Future   Assessment and Plan Assessment & Plan    Follow-up: Return if symptoms worsen or fail to improve.   Chelsea Aurora, NP     [1]  Allergies Allergen  Reactions   Celebrex  [Celecoxib ]     GI upset   Nsaids     Intolerant- GI upset.    "

## 2024-07-05 NOTE — Patient Instructions (Signed)
 PLEASE CALL AND get the Mammogram scheduled [facility] - call [phone]

## 2024-07-10 ENCOUNTER — Encounter: Payer: Self-pay | Admitting: Dermatology

## 2024-07-10 NOTE — Telephone Encounter (Signed)
 Sample provided. LOT: [id number] EXP: 10/10/2025

## 2024-07-19 ENCOUNTER — Ambulatory Visit: Admission: RE | Admit: 2024-07-19 | Source: Ambulatory Visit

## 2024-07-19 ENCOUNTER — Inpatient Hospital Stay: Admission: RE | Admit: 2024-07-19 | Source: Ambulatory Visit

## 2024-07-19 DIAGNOSIS — N6313 Unspecified lump in the right breast, lower outer quadrant: Secondary | ICD-10-CM

## 2024-07-23 ENCOUNTER — Ambulatory Visit: Admitting: Dermatology

## 2024-08-06 ENCOUNTER — Encounter

## 2024-09-26 ENCOUNTER — Encounter

## 2024-10-09 ENCOUNTER — Ambulatory Visit: Admitting: Dermatology

## 2024-12-03 ENCOUNTER — Ambulatory Visit: Admitting: Dermatology
